# Patient Record
Sex: Male | Born: 1955 | Race: Black or African American | Hispanic: No
Health system: Midwestern US, Academic
[De-identification: ages and names within clinical notes are randomized; demographics above are authoritative.]

---

## 2017-02-09 ENCOUNTER — Encounter: Admit: 2017-02-09 | Discharge: 2017-02-09

## 2017-02-09 ENCOUNTER — Ambulatory Visit: Admit: 2017-02-09 | Discharge: 2017-02-09

## 2017-02-09 ENCOUNTER — Ambulatory Visit: Admit: 2017-02-09 | Discharge: 2017-02-09 | Payer: MEDICAID

## 2017-02-09 DIAGNOSIS — G4733 Obstructive sleep apnea (adult) (pediatric): ICD-10-CM

## 2017-02-09 DIAGNOSIS — I25119 Atherosclerotic heart disease of native coronary artery with unspecified angina pectoris: ICD-10-CM

## 2017-02-09 DIAGNOSIS — E782 Mixed hyperlipidemia: ICD-10-CM

## 2017-02-09 DIAGNOSIS — R079 Chest pain, unspecified: Principal | ICD-10-CM

## 2017-02-09 DIAGNOSIS — I429 Cardiomyopathy, unspecified: ICD-10-CM

## 2017-02-09 DIAGNOSIS — I10 Essential (primary) hypertension: ICD-10-CM

## 2017-02-09 DIAGNOSIS — I200 Unstable angina: ICD-10-CM

## 2017-02-09 DIAGNOSIS — I739 Peripheral vascular disease, unspecified: ICD-10-CM

## 2017-02-09 DIAGNOSIS — E119 Type 2 diabetes mellitus without complications: ICD-10-CM

## 2017-02-09 DIAGNOSIS — E785 Hyperlipidemia, unspecified: ICD-10-CM

## 2017-02-09 DIAGNOSIS — R0600 Dyspnea, unspecified: ICD-10-CM

## 2017-02-09 DIAGNOSIS — J449 Chronic obstructive pulmonary disease, unspecified: ICD-10-CM

## 2017-02-09 DIAGNOSIS — Z720 Tobacco use: ICD-10-CM

## 2017-02-09 DIAGNOSIS — I2510 Atherosclerotic heart disease of native coronary artery without angina pectoris: Secondary | ICD-10-CM

## 2017-02-09 LAB — COMPREHENSIVE METABOLIC PANEL
Lab: 0.3 mg/dL (ref 0.3–1.2)
Lab: 110 U/L (ref 25–110)
Lab: 14 U/L (ref 7–40)
Lab: 141 MMOL/L (ref 137–147)
Lab: 16 U/L (ref 7–56)
Lab: 25 MMOL/L (ref 21–30)
Lab: 3.8 MMOL/L (ref 3.5–5.1)
Lab: 4.4 g/dL — ABNORMAL LOW (ref 3.5–5.0)
Lab: 56 mL/min — ABNORMAL LOW (ref >60)
Lab: 8 10*3/uL (ref 3–12)
Lab: >60 mL/min (ref >60)

## 2017-02-09 LAB — LIPID PROFILE
Lab: 124 mg/dL (ref 6.0–8.0)
Lab: 153 mg/dL (ref <200)
Lab: 231 mg/dL — ABNORMAL HIGH (ref <150)
Lab: 46 mg/dL (ref 8.5–10.6)
Lab: 95 mg/dL — ABNORMAL HIGH (ref <100)

## 2017-02-09 LAB — BNP (B-TYPE NATRIURETIC PEPTI): Lab: 71 pg/mL (ref 0–100)

## 2017-02-09 LAB — CBC AND DIFF
Lab: 0.1 10*3/uL (ref 0–0.20)
Lab: 5.5 M/UL — ABNORMAL HIGH (ref 4.4–5.5)
Lab: 6.7 10*3/uL (ref 4.5–11.0)

## 2017-02-09 LAB — TROPONIN-I: Lab: 0 ng/mL — ABNORMAL LOW (ref >40)

## 2017-02-09 MED ORDER — NITROGLYCERIN 0.4 MG SL SUBL
ORAL_TABLET | SUBLINGUAL | 3 refills | 9.00000 days | Status: AC | PRN
Start: 2017-02-09 — End: 2019-03-07

## 2017-02-09 MED ORDER — DIPHENHYDRAMINE HCL 50 MG PO CAP
50 mg | Freq: Once | ORAL | 0 refills | Status: CN
Start: 2017-02-09 — End: ?

## 2017-02-09 MED ORDER — ASPIRIN 81 MG PO CHEW
324 mg | Freq: Once | ORAL | 0 refills | Status: CN
Start: 2017-02-09 — End: ?

## 2017-02-09 MED ORDER — CLOPIDOGREL 75 MG PO TAB
75 mg | Freq: Every day | ORAL | 0 refills | Status: CN
Start: 2017-02-09 — End: ?

## 2017-02-09 MED ORDER — SODIUM CHLORIDE 0.9 % IV SOLP
250 mL | INTRAVENOUS | 0 refills | Status: CN | PRN
Start: 2017-02-09 — End: ?

## 2017-02-09 MED ORDER — PREDNISONE 20 MG PO TAB
ORAL_TABLET | 0 refills | Status: SS
Start: 2017-02-09 — End: 2017-02-14

## 2017-02-09 MED ORDER — IMS MIXTURE TEMPLATE
60 mg | Freq: Once | ORAL | 0 refills | Status: CN
Start: 2017-02-09 — End: ?

## 2017-02-09 MED ORDER — FAMOTIDINE 20 MG PO TAB
20 mg | Freq: Once | ORAL | 0 refills | Status: CN
Start: 2017-02-09 — End: ?

## 2017-02-09 NOTE — Progress Notes
Date of Service: 02/09/2017    Sean Henderson is a 61 y.o. male.       HPI    I have seen and examined Sean Henderson along with the fellow, Dr. Lowell Guitar.  I have seen and examined the patient.  I reviewed the history, physical, and formed the impression and plan as outlined in the note.  He returns for cardiac followup.  He has been having increasing discomforts in his chest with shortness of breath.  He is concerned he has 'a new blockage.'  He has a history of cardiomyopathy with severe LV dysfunction.  He had LAD intervention in 2007 and then repeat stenting in 2009 with balloon intervention to the diagonal vessel.  His last heart catheterization in 2016 showed no significant progressive disease or restenosis.  In the last 2 years since he was seen, he has had prostate cancer.  He is being treated with hormone therapy.  He had radiation.  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  He is not having any claudication or resting symptoms at this time.  Over the last 3 or 4 months, he has had increasing chest tightness, pressure, and discomfort along with shortness of breath.  This improves with rest and aspirin.  He avoids nitroglycerin due to headaches.  He denies palpitations, presyncope, syncope, TIA, stroke, and claudication.     On exam, he is 5 feet 11.  Weight is 252.  Pulse is regular.  Rhythm is sinus.  Blood pressure 144/82.  Venous pressure is normal.  There is no edema.  Lungs are clear.  There is no wheeze or rhonchi.  There are no murmurs or rubs.     I am concerned he has had recurrent angina with increasing frequency and an unstable pattern.  I would like to check lab work, get an echocardiogram and get him in for coronary angiogram and possible intervention.  He will need contrast pretreatment given his history of allergy.     We will keep you informed with the results.  We will check some labs today.  We will check a troponin.  If his troponin is positive, we will proceed with admission later today and heparinization.    (ZOX:[id number])               Vitals:    02/09/17 1506   BP: 144/82   Pulse: 81   Weight: 114.3 kg (252 lb)   Height: 1.803 m (5' 11)     Body mass index is 35.15 kg/m???.     Past Medical History  Patient Active Problem List    Diagnosis Date Noted   ??? Unstable angina (HCC) 02/09/2017   ??? Cardiomyopathy (HCC) 01/07/2015   ??? PVD (peripheral vascular disease) (HCC) 11/25/2014   ??? Right flank pain 10/15/2013   ??? SOB (shortness of breath) 04/24/2011   ??? Chest pain 04/24/2011   ??? History of repair of left rotator cuff 06/20/2010     2010     ??? Tobacco dependence 03/02/2010     Peak tobacco consumption 4 ppd     ??? COPD (chronic obstructive pulmonary disease) (HCC) 04/01/2009     A. 2008 PFT FEV1/FEC 68%, FEV 56%, TLC 86%  B. Mod to severe not on  inhalers followed by [facility] Dr Lowella Petties     ??? Obstructive sleep apnea  10/28/2008     A. 07/22/08- Sleep study: [place] Mild obstructive breathing overall,moderate degree supine and during REM sleep non-supine.Mild  Desats & mild sleep disturbance.   B. 10/06/08- CPAP titration: to 14cm      ??? HTN (hypertension) 12/09/2007   ??? HLD (hyperlipidemia) 12/09/2007     A. 11/04 total 189 trig 167 HDL 29 LDL 144   B. 1/05 total 98 trig 397 HDL 30 LDL 54 Lipitor 40>> 10/06- start Vytorin 10-20  C. 6/07- total 104, trig 91, HDL 27, LDL 67- Vytorin 10-20  E. 16/1/09- total 160, trig 89, HDL 21, LDL 118 Myalgias:DC vytorin, Rx Crestor 10, niaspan 1000  G. 03/24/09- total 102, trig 154, HDL 25, LDL 60- simvastatin 40, niaspan 1gm  I. 09/12/10- total 106, trig 79,HDL 29, LDL 64- simvastatin 20, niaspan 1gm     ??? CAD (coronary artery disease), native coronary artery 12/09/2007     A.2/04 Normal coronaries TMC EF 15%   B. 2/07- Chest pain, admit [place]: Bare metal stent to 90% mLAD, EF 45%  C. 12/09/07 Angina, Cath [place]: BareMetalStent for 80% mLAD, Kissing balloon 80% Dx2   D. 06/17/08- dobutamine stress echo:  LV 5.2. EF 60%. No ischemia. E. 08/25/09- Dobutamine stress echo: Mild concentric LVH. EF 60%. LA is mildly enlarged. No ischemia.  F.  12/01/10 - Dobut   echo:  Terminated w/ HTN, HR<85%   EF - 60%  LVEDD 6.3 cm.  G  5/13 Reg Thall EF 52%, no ischemia    12/08/14: Cath by Dr. Micheline Rough - patent prior stent, no obstructive CAD     ??? 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 Normal coronaries.   B. 10/04- Echo: LVIDD 8.1 , EF 25% Valves OK  C. 4/05- Echo: EF 45%. LV 5.5 cm>>> BNP 24. tolerates hydralazine, headaches w/ imdur  D. 2/06, Echo: LV 5.6, EF 50%   E. 2/07- Chest pain, admit [place]: Bare metal stent to 90% mLAD, EF 45%  F. 02/19/07- Dobut echo: LV 6.4, EF 40%, No ischemia, Hypertensive BP response.   G. 12/09/07 Angina, Cath [place]: BareMetalStent for 80% mLAD, Kissing balloon 80% Dx2   H. 2/10 Echo EF 60%  I.  8/11 Dobutamine echo EF 60% no ischemia post stress by EKG or Echo.   J 5/13 Reg Thall EF 52%, no ischemia     ??? Encounter for long-term (current) use of other medications 09/11/2006         ROS    Physical Exam      Cardiovascular Studies      Problems Addressed Today  Encounter Diagnoses   Name Primary?   ??? Unstable angina (HCC) Yes   ??? Essential hypertension    ??? Mixed hyperlipidemia    ??? Coronary artery disease involving native coronary artery of native heart with angina pectoris (HCC)    ??? PVD (peripheral vascular disease) (HCC)    ??? Cardiomyopathy, unspecified type (HCC)    ??? Chest pain, unspecified type    ??? Obstructive sleep apnea         Assessment and Plan              Current Medications (including today's revisions)  ??? albuterol (VENTOLIN HFA, PROAIR HFA) 90 mcg/actuation inhaler Inhale 1-2 Puffs by mouth every 4 hours as needed for Wheezing (or shortness of breath).   ??? amLODIPine (NORVASC) 10 mg tablet TAKE 1 TABLET BY MOUTH EVERY DAY   ??? aspirin EC 81 mg tablet Take 1 Tab  by mouth daily. ??? atorvastatin (LIPITOR) 20 mg tablet TAKE 1 TABLET DAILY   ??? buPROPion SR(+) (WELLBUTRIN SR) 150 mg tablet Take 1 Tab by mouth twice daily. Take one tab daily for 3 days then increase to one tab twice a day thereafter   ??? carvedilol (COREG) 25 mg tablet TAKE 1 TABLET BY MOUTH TWICE A DAY WITH FOOD   ??? clopiDOGrel (PLAVIX) 75 mg tablet Take 75 mg by mouth daily. Indications: For peripheral stents - managed and to be filled by [facility] physician   ??? gabapentin (NEURONTIN) 100 mg PO capsule Take 1 Cap by mouth three times daily.   ??? hydrALAzine (APRESOLINE) 50 mg tablet TAKE 1 TABLET BY MOUTH 3 TIMES A DAY   ??? lisinopril (PRINIVIL, ZESTRIL) 40 mg tablet 1 Tab daily.   ??? metFORMIN-ER(+) (FORTAMET) 1,000 mg extended release tablet Take 1 Tab by mouth daily. DO NOT RESUME UNTIL 12/11/14   ??? nicotine (NICODERM CQ STEP 1) 21 mg/day patch Apply 1 Patch to top of skin as directed daily.     ??? nitroglycerin (NITROSTAT) 0.4 mg tablet Place one tab under tongue every 5 mins not to exceed 3 tabs;as needed for chest pain. If chest pain persists go to the ER or call 911.   ??? potassium chloride SR (K-DUR) 20 mEq tablet Take 1 Tab by mouth daily. Take with a meal and a full glass of water.   ??? prednisone (DELTASONE) 20 mg tablet Take 3 tabs night before procedure and 3 tabs am of procedure.

## 2017-02-09 NOTE — Progress Notes
Date of Service: 02/09/2017    Sean Henderson is a 61 y.o. male.       HPI     Sean Henderson is a 61 yo male with hx of CAD s/p PCI to LAD with BMSx2 (2007 and 2009), dilated cardiomyopathy, HFrEF (improved to 55% in 2014), PAD s/p PCI of the left leg, who presents to the clinic for follow up of his dilated cardiomyopathy.    Sean Henderson was last seen in the clinic in Sep 2016. 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 Most recent stent was placed in May 2018 at [facility] center. He is currently taking aspirin and plavix. He was also treated for prostate cancer. Today he says that he has been noticing shortness of breath and chest pain mainly with exertion. His chest pain is located in the left side of the chest and is non radiating. Worse with exertion and improves somewhat with rest. He describes is as both sharp and dull. Currently he denies any chest pain at rest. He denied any stenting to coronaries since 2009. He also reports exertional dyspnea but denied any associated orthopnea or PND. He denied any leg swellings, palpitations, presyncope or syncope. He continues to smoke although he reports he is trying to quit.         Vitals:    02/09/17 1506   BP: 144/82   Pulse: 81   Weight: 114.3 kg (252 lb)   Height: 1.803 m (5' 11)     Body mass index is 35.15 kg/m???.     Past Medical History  Patient Active Problem List    Diagnosis Date Noted   ??? Cardiomyopathy (HCC) 01/07/2015   ??? PVD (peripheral vascular disease) (HCC) 11/25/2014   ??? Right flank pain 10/15/2013   ??? SOB (shortness of breath) 04/24/2011   ??? Chest pain 04/24/2011   ??? History of repair of left rotator cuff 06/20/2010     2010     ??? Tobacco dependence 03/02/2010     Peak tobacco consumption 4 ppd     ??? COPD (chronic obstructive pulmonary disease) (HCC) 04/01/2009     A. 2008 PFT FEV1/FEC 68%, FEV 56%, TLC 86%  B. Mod to severe not on  inhalers followed by [facility] Dr Lowella Petties ??? Obstructive sleep apnea  10/28/2008     A. 07/22/08- Sleep study: [place] Mild obstructive breathing overall,moderate degree supine and during REM sleep non-supine.Mild Desats & mild sleep disturbance.   B. 10/06/08- CPAP titration: to 14cm      ??? HTN (hypertension) 12/09/2007   ??? HLD (hyperlipidemia) 12/09/2007     A. 11/04 total 189 trig 167 HDL 29 LDL 144   B. 1/05 total 98 trig 397 HDL 30 LDL 54 Lipitor 40>> 10/06- start Vytorin 10-20  C. 6/07- total 104, trig 91, HDL 27, LDL 67- Vytorin 10-20  E. 16/1/09- total 160, trig 89, HDL 21, LDL 118 Myalgias:DC vytorin, Rx Crestor 10, niaspan 1000  G. 03/24/09- total 102, trig 154, HDL 25, LDL 60- simvastatin 40, niaspan 1gm  I. 09/12/10- total 106, trig 79,HDL 29, LDL 64- simvastatin 20, niaspan 1gm     ??? CAD (coronary artery disease), native coronary artery 12/09/2007     A.2/04 Normal coronaries TMC EF 15%   B. 2/07- Chest pain, admit [place]: Bare metal stent to 90% mLAD, EF 45%  C. 12/09/07 Angina, Cath [place]: BareMetalStent for 80% mLAD,  Kissing balloon 80% Dx2   D. 06/17/08- dobutamine stress echo:  LV 5.2. EF 60%. No ischemia.   E. 08/25/09- Dobutamine stress echo: Mild concentric LVH. EF 60%. LA is mildly enlarged. No ischemia.  F.  12/01/10 - Dobut   echo:  Terminated w/ HTN, HR<85%   EF - 60%  LVEDD 6.3 cm.  G  5/13 Reg Thall EF 52%, no ischemia    12/08/14: Cath by Dr. Micheline Rough - patent prior stent, no obstructive CAD     ??? 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 Normal coronaries.   B. 10/04- Echo: LVIDD 8.1 , EF 25% Valves OK  C. 4/05- Echo: EF 45%. LV 5.5 cm>>> BNP 24. tolerates hydralazine, headaches w/ imdur  D. 2/06, Echo: LV 5.6, EF 50%   E. 2/07- Chest pain, admit [place]: Bare metal stent to 90% mLAD, EF 45%  F. 02/19/07- Dobut echo: LV 6.4, EF 40%, No ischemia, Hypertensive BP response. G. 12/09/07 Angina, Cath : BareMetalStent for 80% mLAD, Kissing balloon 80% Dx2   H. 2/10 Echo EF 60%  I.  8/11 Dobutamine echo EF 60% no ischemia post stress by EKG or Echo.   J 5/13 Reg Thall EF 52%, no ischemia     ??? Encounter for long-term (current) use of other medications 09/11/2006         Review of Systems   Constitution: Positive for diaphoresis, weakness and malaise/fatigue.   HENT: Negative for congestion and sore throat.    Eyes: Negative for pain.   Cardiovascular: Positive for chest pain, claudication and dyspnea on exertion.   Respiratory: Positive for shortness of breath, snoring and wheezing.    Hematologic/Lymphatic: Negative.    Skin: Negative for color change and unusual hair distribution.   Musculoskeletal: Positive for back pain. Negative for arthritis.   Gastrointestinal: Positive for anorexia. Negative for diarrhea.   Genitourinary: Positive for incomplete emptying and nocturia.   Neurological: Positive for excessive daytime sleepiness.   Psychiatric/Behavioral: Positive for depression. The patient is not nervous/anxious.    Allergic/Immunologic: Negative.        Physical Exam   Constitutional: He appears well-developed and well-nourished. No distress.   HENT:   Head: Normocephalic and atraumatic.   Eyes: EOM are normal. Left eye exhibits no discharge. No scleral icterus.   Neck: Neck supple. No JVD present. No tracheal deviation present. No thyromegaly present.   Cardiovascular: Normal rate, regular rhythm, normal heart sounds and intact distal pulses.  Exam reveals no gallop and no friction rub.    Pulmonary/Chest: Effort normal and breath sounds normal. No respiratory distress. He has no wheezes. He has no rales. He exhibits no tenderness.   Abdominal: Soft. Bowel sounds are normal. He exhibits distension. There is no tenderness. There is no rebound and no guarding.   Musculoskeletal: Normal range of motion. He exhibits no edema.   Lymphadenopathy:     He has no cervical adenopathy. Neurological: He is alert and oriented to person, place, and time.   Skin: Skin is warm and dry. He is not diaphoretic.         Cardiovascular Studies    ECHO - 2014  Moderate concentric hypertrophy of left ventricle with low normal ejection fraction 50-55 %.  Grade I (mild ) left ventricular diastolic dysfunction. Elevated left atrial pressure.   Left atrial cavity is mildly dilated.  No valvular abnormality.  No pericardial effusion.   ???  Comparison was done with the previous study performed on 04/24/11. Ejection fraction appears reduced in the current study.     Cardiac Cath 2016  No obstructive coronary artery disease as noted above with minimal in-stent restenosis of 2 prior known stents in the LAD system.  Normal systemic blood pressures.   Normal left ventricular end-diastolic pressure.  No stenosis across the aortic valve.    EKG Today  Sinus rhythm, LAD, early RW progression,LVH, No ST changes    Problems Addressed Today  No diagnosis found.    Assessment and Plan      # Chest pain - DDx unstable angina vs stable angina vs non cardiac chest pain.  # Coronary artery disease s/p PCI to LAD with BMS, most recent in 2009  # Dilated cardiomyopathy  # Peripheral arterial disease s/p stenting. Most recent in June 2018  # HTN  # Hx of prostate cancer    Plan  - Will obtain labs including cbc, cmp, bnp and trop  - Will give a prescription for PRN SL nitroglycerine  - Avoid strenuous activity  - Since the patient doesn't complain of rest pain, will plan for cardiac cath early next week.  - If the troponin comes back positive today, will admit for heparin gtt today  - Will also obtain an ECHO w doppler.   - Continue aspirin and plavix. Continue atorvastatin  - Continue home coreg, lisinopril and rest of the home HTN medications.    Pt seen and discussed with Dr. Vivianne Spence.    Judeen Hammans, MBBS  CV Fellow  Pager: [phone]           Current Medications (including today's revisions) ??? albuterol (VENTOLIN HFA, PROAIR HFA) 90 mcg/actuation inhaler Inhale 1-2 Puffs by mouth every 4 hours as needed for Wheezing (or shortness of breath).   ??? amLODIPine (NORVASC) 10 mg tablet TAKE 1 TABLET BY MOUTH EVERY DAY   ??? aspirin EC 81 mg tablet Take 1 Tab by mouth daily.   ??? atorvastatin (LIPITOR) 20 mg tablet TAKE 1 TABLET DAILY   ??? buPROPion SR(+) (WELLBUTRIN SR) 150 mg tablet Take 1 Tab by mouth twice daily. Take one tab daily for 3 days then increase to one tab twice a day thereafter   ??? carvedilol (COREG) 25 mg tablet TAKE 1 TABLET BY MOUTH TWICE A DAY WITH FOOD   ??? clopiDOGrel (PLAVIX) 75 mg tablet Take 75 mg by mouth daily. Indications: For peripheral stents - managed and to be filled by [facility] physician   ??? gabapentin (NEURONTIN) 100 mg PO capsule Take 1 Cap by mouth three times daily.   ??? hydrALAzine (APRESOLINE) 50 mg tablet TAKE 1 TABLET BY MOUTH 3 TIMES A DAY   ??? lisinopril (PRINIVIL, ZESTRIL) 40 mg tablet 1 Tab daily.   ??? metFORMIN-ER(+) (FORTAMET) 1,000 mg extended release tablet Take 1 Tab by mouth daily. DO NOT RESUME UNTIL 12/11/14   ??? nicotine (NICODERM CQ STEP 1) 21 mg/day patch Apply 1 Patch to top of skin as directed daily.     ??? potassium chloride SR (K-DUR) 20 mEq tablet Take 1 Tab by mouth daily. Take with a meal and a full glass of water.

## 2017-02-12 ENCOUNTER — Encounter: Admit: 2017-02-12 | Discharge: 2017-02-12

## 2017-02-12 DIAGNOSIS — I200 Unstable angina: Principal | ICD-10-CM

## 2017-02-12 DIAGNOSIS — R079 Chest pain, unspecified: ICD-10-CM

## 2017-02-12 DIAGNOSIS — I10 Essential (primary) hypertension: Secondary | ICD-10-CM

## 2017-02-12 DIAGNOSIS — I25110 Atherosclerotic heart disease of native coronary artery with unstable angina pectoris: Principal | ICD-10-CM

## 2017-02-12 MED ORDER — MAGNESIUM HYDROXIDE 2,400 MG/10 ML PO SUSP
10 mL | ORAL | 0 refills | Status: CN | PRN
Start: 2017-02-12 — End: ?

## 2017-02-12 MED ORDER — ACETAMINOPHEN 325 MG PO TAB
650 mg | ORAL | 0 refills | Status: CN | PRN
Start: 2017-02-12 — End: ?

## 2017-02-12 MED ORDER — ALUMINUM-MAGNESIUM HYDROXIDE 200-200 MG/5 ML PO SUSP
30 mL | ORAL | 0 refills | Status: CN | PRN
Start: 2017-02-12 — End: ?

## 2017-02-12 MED ORDER — TEMAZEPAM 15 MG PO CAP
15 mg | Freq: Every evening | ORAL | 0 refills | Status: CN | PRN
Start: 2017-02-12 — End: ?

## 2017-02-12 NOTE — H&P (View-Only)
Patient presents for procedure. Please see most recent H/P from 02/09/17 below.     Sean Parkinson, APRN-C  Pager [phone]     _____________________________________________________________________________    Office Visit     02/09/2017  Cardiovascular Medicine   [name], MD   Cardiology   Unstable angina [facility]) +7 more   Dx   Cardiac Eval ; Referred by Self, Referral   Reason for Visit    Progress Notes   [name], MD (Physician) ??? ??? Cardiology ??? ??? 02/09/17 1445 ??? ??? Signed      Date of Service: 02/09/2017  ???  Sean Henderson is a 61 y.o. male.     ???  HPI    I have seen and examined Mr. Dockstader along with the fellow, Dr. Lowell Guitar.  I have seen and examined the patient.  I reviewed the history, physical, and formed the impression and plan as outlined in the note.  He returns for cardiac followup.  He has been having increasing discomforts in his chest with shortness of breath.  He is concerned he has 'a new blockage.'  He has a history of cardiomyopathy with severe LV dysfunction.  He had LAD intervention in 2007 and then repeat stenting in 2009 with balloon intervention to the diagonal vessel.  His last heart catheterization in 2016 showed no significant progressive disease or restenosis.  In the last 2 years since he was seen, he has had prostate cancer.  He is being treated with hormone therapy.  He had radiation.  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  He is not having any claudication or resting symptoms at this time.  Over the last 3 or 4 months, he has had increasing chest tightness, pressure, and discomfort along with shortness of breath.  This improves with rest and aspirin.  He avoids nitroglycerin due to headaches.  He denies palpitations, presyncope, syncope, TIA, stroke, and claudication.   ???  On exam, he is 5 feet 11.  Weight is 252.  Pulse is regular.  Rhythm is sinus.  Blood pressure 144/82.  Venous pressure is normal.  There is no edema.  Lungs are clear.  There is no wheeze or rhonchi.  There are no murmurs or rubs.   ???  I am concerned he has had recurrent angina with increasing frequency and an unstable pattern.  I would like to check lab work, get an echocardiogram and get him in for coronary angiogram and possible intervention.  He will need contrast pretreatment given his history of allergy.   ???  We will keep you informed with the results.  We will check some labs today.  We will check a troponin.  If his troponin is positive, we will proceed with admission later today and heparinization.  ???  (RUE:[id number])  ???  ???  ???  ???  ???      Vitals:   ??? 02/09/17 1506   BP: 144/82   Pulse: 81   Weight: 114.3 kg (252 lb)   Height: 1.803 m (5' 11)   ???  Body mass index is 35.15 kg/m???.   ???  Past Medical History        Patient Active Problem List   ??? Diagnosis Date Noted   ??? Unstable angina (HCC) 02/09/2017   ??? Cardiomyopathy (HCC) 01/07/2015   ??? PVD (peripheral vascular disease) (HCC) 11/25/2014   ??? Right flank pain 10/15/2013   ??? SOB (shortness of breath) 04/24/2011   ???  Chest pain 04/24/2011   ??? History of repair of left rotator cuff 06/20/2010   ??? ??? 2010  ???   ??? Tobacco dependence 03/02/2010   ??? ??? Peak tobacco consumption 4 ppd  ???   ??? COPD (chronic obstructive pulmonary disease) (HCC) 04/01/2009   ??? ??? A. 2008 PFT FEV1/FEC 68%, FEV 56%, TLC 86%  B. Mod to severe not on  inhalers followed by [facility] Dr Lowella Petties  ???   ??? Obstructive sleep apnea  10/28/2008   ??? ??? A. 07/22/08- Sleep study: [place] Mild obstructive breathing overall,moderate degree supine and during REM sleep non-supine.Mild Desats & mild sleep disturbance.   B. 10/06/08- CPAP titration: to 14cm   ???   ??? HTN (hypertension) 12/09/2007   ??? HLD (hyperlipidemia) 12/09/2007   ??? ??? A. 11/04 total 189 trig 167 HDL 29 LDL 144   B. 1/05 total 98 trig 397 HDL 30 LDL 54 Lipitor 40>> 10/06- start Vytorin 10-20  C. 6/07- total 104, trig 91, HDL 27, LDL 67- Vytorin 10-20 E. 01/29/07- total 160, trig 89, HDL 21, LDL 118 Myalgias:DC vytorin, Rx Crestor 10, niaspan 1000  G. 03/24/09- total 102, trig 154, HDL 25, LDL 60- simvastatin 40, niaspan 1gm  I. 09/12/10- total 106, trig 79,HDL 29, LDL 64- simvastatin 20, niaspan 1gm  ???   ??? CAD (coronary artery disease), native coronary artery 12/09/2007   ??? ??? A.2/04 Normal coronaries TMC EF 15%   B. 2/07- Chest pain, admit [place]: Bare metal stent to 90% mLAD, EF 45%  C. 12/09/07 Angina, Cath [place]: BareMetalStent for 80% mLAD, Kissing balloon 80% Dx2   D. 06/17/08- dobutamine stress echo:  LV 5.2. EF 60%. No ischemia.   E. 08/25/09- Dobutamine stress echo: Mild concentric LVH. EF 60%. LA is mildly enlarged. No ischemia.  F.  12/01/10 - Dobut   echo:  Terminated w/ HTN, HR<85%   EF - 60%  LVEDD 6.3 cm.  G  5/13 Reg Thall EF 52%, no ischemia  ???  12/08/14: Cath by Dr. Micheline Rough - patent prior stent, no obstructive CAD  ???   ??? 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 ??? Dilated cardiomyopathy and late onset CAD  A, 06/10/02: Cardiac cath: [facility]: EF 15%. Normal coronaries.   B. 10/04- Echo: LVIDD 8.1 , EF 25% Valves OK  C. 4/05- Echo: EF 45%. LV 5.5 cm>>> BNP 24. tolerates hydralazine, headaches w/ imdur  D. 2/06, Echo: LV 5.6, EF 50%   E. 2/07- Chest pain, admit [place]: Bare metal stent to 90% mLAD, EF 45%  F. 02/19/07- Dobut echo: LV 6.4, EF 40%, No ischemia, Hypertensive BP response.   G. 12/09/07 Angina, Cath [place]: BareMetalStent for 80% mLAD, Kissing balloon 80% Dx2   H. 2/10 Echo EF 60%  I.  8/11 Dobutamine echo EF 60% no ischemia post stress by EKG or Echo.   J 5/13 Reg Thall EF 52%, no ischemia  ???   ??? Encounter for long-term (current) use of other medications 09/11/2006   ???  ???  ???  ROS  ???  Physical Exam  ???  ???  Cardiovascular Studies  ???  ???  Problems Addressed Today       Encounter Diagnoses   Name Primary?   ??? Unstable angina (HCC) Yes   ??? Essential hypertension ???   ??? Mixed hyperlipidemia ??? ??? Coronary artery disease involving native coronary artery of native heart with angina pectoris (HCC) ???   ???  PVD (peripheral vascular disease) (HCC) ???   ??? Cardiomyopathy, unspecified type (HCC) ???   ??? Chest pain, unspecified type ???   ??? Obstructive sleep apnea  ???   ???  ???  Assessment and Plan  ???  ???  ???  Current Medications (including today's revisions)  ??? albuterol (VENTOLIN HFA, PROAIR HFA) 90 mcg/actuation inhaler Inhale 1-2 Puffs by mouth every 4 hours as needed for Wheezing (or shortness of breath).   ??? amLODIPine (NORVASC) 10 mg tablet TAKE 1 TABLET BY MOUTH EVERY DAY   ??? aspirin EC 81 mg tablet Take 1 Tab by mouth daily.   ??? atorvastatin (LIPITOR) 20 mg tablet TAKE 1 TABLET DAILY   ??? buPROPion SR(+) (WELLBUTRIN SR) 150 mg tablet Take 1 Tab by mouth twice daily. Take one tab daily for 3 days then increase to one tab twice a day thereafter   ??? carvedilol (COREG) 25 mg tablet TAKE 1 TABLET BY MOUTH TWICE A DAY WITH FOOD   ??? clopiDOGrel (PLAVIX) 75 mg tablet Take 75 mg by mouth daily. Indications: For peripheral stents - managed and to be filled by [facility] physician   ??? gabapentin (NEURONTIN) 100 mg PO capsule Take 1 Cap by mouth three times daily.   ??? hydrALAzine (APRESOLINE) 50 mg tablet TAKE 1 TABLET BY MOUTH 3 TIMES A DAY   ??? lisinopril (PRINIVIL, ZESTRIL) 40 mg tablet 1 Tab daily.   ??? metFORMIN-ER(+) (FORTAMET) 1,000 mg extended release tablet Take 1 Tab by mouth daily. DO NOT RESUME UNTIL 12/11/14   ??? nicotine (NICODERM CQ STEP 1) 21 mg/day patch Apply 1 Patch to top of skin as directed daily.     ??? nitroglycerin (NITROSTAT) 0.4 mg tablet Place one tab under tongue every 5 mins not to exceed 3 tabs;as needed for chest pain. If chest pain persists go to the ER or call 911.   ??? potassium chloride SR (K-DUR) 20 mEq tablet Take 1 Tab by mouth daily. Take with a meal and a full glass of water.   ??? prednisone (DELTASONE) 20 mg tablet Take 3 tabs night before procedure and 3 tabs am of procedure.   ???  ???        Note Details Progress Notes   Taduru, Kirt Boys, MBBS (Fellow) ??? ??? Cardiology ??? ??? 02/09/17 1445 ??? ??? Signed      Date of Service: 02/09/2017  ???  Sean Henderson is a 61 y.o. male.     ???  HPI  Mr Kozlov is a 61 yo male with hx of CAD s/p PCI to LAD with BMSx2 (2007 and 2009), dilated cardiomyopathy, HFrEF (improved to 55% in 2014), PAD s/p PCI of the left leg, who presents to the clinic for follow up of his dilated cardiomyopathy.  ???  Mr Paras was last seen in the clinic in Sep 2016. 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 Most recent stent was placed in May 2018 at [facility] center. He is currently taking aspirin and plavix. He was also treated for prostate cancer. Today he says that he has been noticing shortness of breath and chest pain mainly with exertion. His chest pain is located in the left side of the chest and is non radiating. Worse with exertion and improves somewhat with rest. He describes is as both sharp and dull. Currently he denies any chest pain at rest. He denied any stenting to coronaries  since 2009. He also reports exertional dyspnea but denied any associated orthopnea or PND. He denied any leg swellings, palpitations, presyncope or syncope. He continues to smoke although he reports he is trying to quit.  ???  ???      Vitals:   ??? 02/09/17 1506   BP: 144/82   Pulse: 81   Weight: 114.3 kg (252 lb)   Height: 1.803 m (5' 11)   ???  Body mass index is 35.15 kg/m???.   ???  Past Medical History        Patient Active Problem List   ??? Diagnosis Date Noted   ??? Cardiomyopathy (HCC) 01/07/2015   ??? PVD (peripheral vascular disease) (HCC) 11/25/2014   ??? Right flank pain 10/15/2013   ??? SOB (shortness of breath) 04/24/2011   ??? Chest pain 04/24/2011   ??? History of repair of left rotator cuff 06/20/2010   ??? ??? 2010  ???   ??? Tobacco dependence 03/02/2010   ??? ??? Peak tobacco consumption 4 ppd  ??? ??? COPD (chronic obstructive pulmonary disease) (HCC) 04/01/2009   ??? ??? A. 2008 PFT FEV1/FEC 68%, FEV 56%, TLC 86%  B. Mod to severe not on  inhalers followed by [facility] Dr Lowella Petties  ???   ??? Obstructive sleep apnea  10/28/2008   ??? ??? A. 07/22/08- Sleep study: [place] Mild obstructive breathing overall,moderate degree supine and during REM sleep non-supine.Mild Desats & mild sleep disturbance.   B. 10/06/08- CPAP titration: to 14cm   ???   ??? HTN (hypertension) 12/09/2007   ??? HLD (hyperlipidemia) 12/09/2007   ??? ??? A. 11/04 total 189 trig 167 HDL 29 LDL 144   B. 1/05 total 98 trig 397 HDL 30 LDL 54 Lipitor 40>> 10/06- start Vytorin 10-20  C. 6/07- total 104, trig 91, HDL 27, LDL 67- Vytorin 10-20  E. 16/1/09- total 160, trig 89, HDL 21, LDL 118 Myalgias:DC vytorin, Rx Crestor 10, niaspan 1000  G. 03/24/09- total 102, trig 154, HDL 25, LDL 60- simvastatin 40, niaspan 1gm  I. 09/12/10- total 106, trig 79,HDL 29, LDL 64- simvastatin 20, niaspan 1gm  ???   ??? CAD (coronary artery disease), native coronary artery 12/09/2007   ??? ??? A.2/04 Normal coronaries TMC EF 15%   B. 2/07- Chest pain, admit [place]: Bare metal stent to 90% mLAD, EF 45%  C. 12/09/07 Angina, Cath [place]: BareMetalStent for 80% mLAD, Kissing balloon 80% Dx2   D. 06/17/08- dobutamine stress echo:  LV 5.2. EF 60%. No ischemia.   E. 08/25/09- Dobutamine stress echo: Mild concentric LVH. EF 60%. LA is mildly enlarged. No ischemia.  F.  12/01/10 - Dobut   echo:  Terminated w/ HTN, HR<85%   EF - 60%  LVEDD 6.3 cm.  G  5/13 Reg Thall EF 52%, no ischemia  ???  12/08/14: Cath by Dr. Micheline Rough - patent prior stent, no obstructive CAD  ???   ??? 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 ??? Dilated cardiomyopathy and late onset CAD  A, 06/10/02: Cardiac cath: [facility]: EF 15%. Normal coronaries.   B. 10/04- Echo: LVIDD 8.1 , EF 25% Valves OK  C. 4/05- Echo: EF 45%. LV 5.5 cm>>> BNP 24. tolerates hydralazine, headaches w/ imdur  D. 2/06, Echo: LV 5.6, EF 50% E. 2/07- Chest pain, admit [place]: Bare metal stent to 90% mLAD, EF 45%  F. 02/19/07- Dobut echo: LV 6.4, EF 40%, No ischemia, Hypertensive BP response.   G. 12/09/07  Angina, Cath [place]: BareMetalStent for 80% mLAD, Kissing balloon 80% Dx2   H. 2/10 Echo EF 60%  I.  8/11 Dobutamine echo EF 60% no ischemia post stress by EKG or Echo.   J 5/13 Reg Thall EF 52%, no ischemia  ???   ??? Encounter for long-term (current) use of other medications 09/11/2006   ???  ???  ???  Review of Systems   Constitution: Positive for diaphoresis, weakness and malaise/fatigue.   HENT: Negative for congestion and sore throat.    Eyes: Negative for pain.   Cardiovascular: Positive for chest pain, claudication and dyspnea on exertion.   Respiratory: Positive for shortness of breath, snoring and wheezing.    Hematologic/Lymphatic: Negative.    Skin: Negative for color change and unusual hair distribution.   Musculoskeletal: Positive for back pain. Negative for arthritis.   Gastrointestinal: Positive for anorexia. Negative for diarrhea.   Genitourinary: Positive for incomplete emptying and nocturia.   Neurological: Positive for excessive daytime sleepiness.   Psychiatric/Behavioral: Positive for depression. The patient is not nervous/anxious.    Allergic/Immunologic: Negative.    ???  ???  Physical Exam   Constitutional: He appears well-developed and well-nourished. No distress.   HENT:   Head: Normocephalic and atraumatic.   Eyes: EOM are normal. Left eye exhibits no discharge. No scleral icterus.   Neck: Neck supple. No JVD present. No tracheal deviation present. No thyromegaly present.   Cardiovascular: Normal rate, regular rhythm, normal heart sounds and intact distal pulses.  Exam reveals no gallop and no friction rub.    Pulmonary/Chest: Effort normal and breath sounds normal. No respiratory distress. He has no wheezes. He has no rales. He exhibits no tenderness.   Abdominal: Soft. Bowel sounds are normal. He exhibits distension. There is no tenderness. There is no rebound and no guarding.   Musculoskeletal: Normal range of motion. He exhibits no edema.   Lymphadenopathy:     He has no cervical adenopathy.   Neurological: He is alert and oriented to person, place, and time.   Skin: Skin is warm and dry. He is not diaphoretic.   ???  ???  ???  Cardiovascular Studies  ???  ECHO - 2014  Moderate concentric hypertrophy of left ventricle with low normal ejection fraction 50-55 %.  Grade I (mild ) left ventricular diastolic dysfunction. Elevated left atrial pressure.   Left atrial cavity is mildly dilated.   No valvular abnormality.  No pericardial effusion.   ???  Comparison was done with the previous study performed on 04/24/11. Ejection fraction appears reduced in the current study.   ???  Cardiac Cath 2016  No obstructive coronary artery disease as noted above with minimal in-stent restenosis of 2 prior known stents in the LAD system.  Normal systemic blood pressures.   Normal left ventricular end-diastolic pressure.  No stenosis across the aortic valve.  ???  EKG Today  Sinus rhythm, LAD, early RW progression,LVH, No ST changes  ???  Problems Addressed Today  No diagnosis found.  ???  Assessment and Plan      # Chest pain - DDx unstable angina vs stable angina vs non cardiac chest pain.  # Coronary artery disease s/p PCI to LAD with BMS, most recent in 2009  # Dilated cardiomyopathy  # Peripheral arterial disease s/p stenting. Most recent in June 2018  # HTN  # Hx of prostate cancer  ???  Plan  - Will obtain labs including cbc, cmp, bnp and trop  - Will  give a prescription for PRN SL nitroglycerine  - Avoid strenuous activity  - Since the patient doesn't complain of rest pain, will plan for cardiac cath early next week.  - If the troponin comes back positive today, will admit for heparin gtt today  - Will also obtain an ECHO w doppler.   - Continue aspirin and plavix. Continue atorvastatin  - Continue home coreg, lisinopril and rest of the home HTN medications.  ??? Pt seen and discussed with Dr. Vivianne Spence.  ???  Sean Henderson, MBBS  CV Fellow  Pager: [phone]  ???  ???  ???  Current Medications (including today's revisions)  ??? albuterol (VENTOLIN HFA, PROAIR HFA) 90 mcg/actuation inhaler Inhale 1-2 Puffs by mouth every 4 hours as needed for Wheezing (or shortness of breath).   ??? amLODIPine (NORVASC) 10 mg tablet TAKE 1 TABLET BY MOUTH EVERY DAY   ??? aspirin EC 81 mg tablet Take 1 Tab by mouth daily.   ??? atorvastatin (LIPITOR) 20 mg tablet TAKE 1 TABLET DAILY   ??? buPROPion SR(+) (WELLBUTRIN SR) 150 mg tablet Take 1 Tab by mouth twice daily. Take one tab daily for 3 days then increase to one tab twice a day thereafter   ??? carvedilol (COREG) 25 mg tablet TAKE 1 TABLET BY MOUTH TWICE A DAY WITH FOOD   ??? clopiDOGrel (PLAVIX) 75 mg tablet Take 75 mg by mouth daily. Indications: For peripheral stents - managed and to be filled by [facility] physician   ??? gabapentin (NEURONTIN) 100 mg PO capsule Take 1 Cap by mouth three times daily.   ??? hydrALAzine (APRESOLINE) 50 mg tablet TAKE 1 TABLET BY MOUTH 3 TIMES A DAY   ??? lisinopril (PRINIVIL, ZESTRIL) 40 mg tablet 1 Tab daily.   ??? metFORMIN-ER(+) (FORTAMET) 1,000 mg extended release tablet Take 1 Tab by mouth daily. DO NOT RESUME UNTIL 12/11/14   ??? nicotine (NICODERM CQ STEP 1) 21 mg/day patch Apply 1 Patch to top of skin as directed daily.     ??? potassium chloride SR (K-DUR) 20 mEq tablet Take 1 Tab by mouth daily. Take with a meal and a full glass of water.   ???

## 2017-02-12 NOTE — Progress Notes
[employer] with [employer], [phone], confirmed benefits and eligibility:  Patient is enrolled and the plan is active.  Pre-certification through [employer], [phone], is required for [place] [id number]. Reference #02/12/2017 11:30    Allie, clinical review nurse for [employer], after extensive clinical review gave approval for procedure, valid for a single date of service between 02/14/2017 - 03/16/2017.  Authorization [id number]

## 2017-02-13 ENCOUNTER — Ambulatory Visit: Admit: 2017-02-13 | Discharge: 2017-02-13

## 2017-02-14 ENCOUNTER — Ambulatory Visit: Admit: 2017-02-14 | Discharge: 2017-02-14

## 2017-02-14 ENCOUNTER — Encounter: Admit: 2017-02-14 | Discharge: 2017-02-14

## 2017-02-14 ENCOUNTER — Ambulatory Visit: Admit: 2017-02-14 | Discharge: 2017-02-14 | Payer: MEDICAID

## 2017-02-14 ENCOUNTER — Ambulatory Visit: Admit: 2017-02-14 | Discharge: 2017-02-15

## 2017-02-14 DIAGNOSIS — R0602 Shortness of breath: ICD-10-CM

## 2017-02-14 DIAGNOSIS — I25119 Atherosclerotic heart disease of native coronary artery with unspecified angina pectoris: ICD-10-CM

## 2017-02-14 DIAGNOSIS — J449 Chronic obstructive pulmonary disease, unspecified: ICD-10-CM

## 2017-02-14 DIAGNOSIS — G4733 Obstructive sleep apnea (adult) (pediatric): ICD-10-CM

## 2017-02-14 DIAGNOSIS — N183 Chronic kidney disease, stage 3 (moderate): ICD-10-CM

## 2017-02-14 DIAGNOSIS — I10 Essential (primary) hypertension: Principal | ICD-10-CM

## 2017-02-14 DIAGNOSIS — E782 Mixed hyperlipidemia: Principal | ICD-10-CM

## 2017-02-14 DIAGNOSIS — I2510 Atherosclerotic heart disease of native coronary artery without angina pectoris: ICD-10-CM

## 2017-02-14 DIAGNOSIS — F17200 Nicotine dependence, unspecified, uncomplicated: ICD-10-CM

## 2017-02-14 DIAGNOSIS — I25110 Atherosclerotic heart disease of native coronary artery with unstable angina pectoris: Principal | ICD-10-CM

## 2017-02-14 DIAGNOSIS — R0600 Dyspnea, unspecified: ICD-10-CM

## 2017-02-14 DIAGNOSIS — E119 Type 2 diabetes mellitus without complications: ICD-10-CM

## 2017-02-14 DIAGNOSIS — Z720 Tobacco use: ICD-10-CM

## 2017-02-14 DIAGNOSIS — E785 Hyperlipidemia, unspecified: ICD-10-CM

## 2017-02-14 LAB — BASIC METABOLIC PANEL
Lab: 135 MMOL/L — ABNORMAL LOW (ref 137–147)
Lab: 4.7 MMOL/L (ref 3.5–5.1)

## 2017-02-14 LAB — POC GLUCOSE: Lab: 187 mg/dL — ABNORMAL HIGH (ref 70–100)

## 2017-02-14 MED ORDER — PREDNISONE 20 MG PO TAB
60 mg | Freq: Once | ORAL | 0 refills | Status: DC
Start: 2017-02-14 — End: 2017-02-15

## 2017-02-14 MED ORDER — ALUMINUM-MAGNESIUM HYDROXIDE 200-200 MG/5 ML PO SUSP
30 mL | ORAL | 0 refills | Status: DC | PRN
Start: 2017-02-14 — End: 2017-02-15

## 2017-02-14 MED ORDER — ASPIRIN 81 MG PO CHEW
324 mg | Freq: Once | ORAL | 0 refills | Status: CP
Start: 2017-02-14 — End: ?
  Administered 2017-02-14: 15:00:00 324 mg via ORAL

## 2017-02-14 MED ORDER — FAMOTIDINE 20 MG PO TAB
20 mg | Freq: Once | ORAL | 0 refills | Status: CP
Start: 2017-02-14 — End: ?
  Administered 2017-02-14: 15:00:00 20 mg via ORAL

## 2017-02-14 MED ORDER — CLOPIDOGREL 75 MG PO TAB
75 mg | Freq: Every day | ORAL | 0 refills | Status: DC
Start: 2017-02-14 — End: 2017-02-15
  Administered 2017-02-14: 15:00:00 75 mg via ORAL

## 2017-02-14 MED ORDER — DIPHENHYDRAMINE HCL 50 MG/ML IJ SOLN
25 mg | INTRAVENOUS | 0 refills | Status: DC | PRN
Start: 2017-02-14 — End: 2017-02-15

## 2017-02-14 MED ORDER — TEMAZEPAM 15 MG PO CAP
15 mg | Freq: Every evening | ORAL | 0 refills | Status: DC | PRN
Start: 2017-02-14 — End: 2017-02-15

## 2017-02-14 MED ORDER — ATORVASTATIN 20 MG PO TAB
40 mg | ORAL_TABLET | Freq: Every day | ORAL | 3 refills | Status: AC
Start: 2017-02-14 — End: 2017-02-15

## 2017-02-14 MED ORDER — ACETAMINOPHEN 325 MG PO TAB
650 mg | ORAL | 0 refills | Status: DC | PRN
Start: 2017-02-14 — End: 2017-02-15

## 2017-02-14 MED ORDER — SODIUM CHLORIDE 0.9 % IV SOLP
250 mL | INTRAVENOUS | 0 refills | Status: DC | PRN
Start: 2017-02-14 — End: 2017-02-15
  Administered 2017-02-14: 15:00:00 250 mL via INTRAVENOUS

## 2017-02-14 MED ORDER — DIPHENHYDRAMINE HCL 50 MG PO CAP
50 mg | Freq: Once | ORAL | 0 refills | Status: CP
Start: 2017-02-14 — End: ?
  Administered 2017-02-14: 15:00:00 50 mg via ORAL

## 2017-02-14 MED ORDER — MAGNESIUM HYDROXIDE 2,400 MG/10 ML PO SUSP
10 mL | ORAL | 0 refills | Status: DC | PRN
Start: 2017-02-14 — End: 2017-02-15

## 2017-02-14 MED ORDER — PERFLUTREN LIPID MICROSPHERES 1.1 MG/ML IV SUSP
1-20 mL | Freq: Once | INTRAVENOUS | 0 refills | Status: CP
Start: 2017-02-14 — End: ?
  Administered 2017-02-14: 20:00:00 1.5 mL via INTRAVENOUS

## 2017-02-14 MED ORDER — DIPHENHYDRAMINE HCL 25 MG PO CAP
25 mg | ORAL | 0 refills | Status: DC | PRN
Start: 2017-02-14 — End: 2017-02-15

## 2017-02-14 MED ORDER — ONDANSETRON HCL (PF) 4 MG/2 ML IJ SOLN
4 mg | INTRAVENOUS | 0 refills | Status: DC | PRN
Start: 2017-02-14 — End: 2017-02-15

## 2017-02-14 NOTE — Discharge Instructions - Pharmacy
Physician Discharge Summary      Name: Sean Henderson  Medical Record Number: [id number]        Account Number:  [id number]  Date Of Birth:  12-Aug-1955                         Age:  61 years   Admit date:  02/14/2017                     Discharge date:  02/14/2017    Attending Physician:               Service: Med-Cardiovasc    Physician Summary completed by: Kathie Rhodes, PA-C    Reason for hospitalization:     Significant PMH:   Past Medical History:   Diagnosis Date   ??? CAD (coronary artery disease), native coronary artery 12/09/2007    A.2/04 Normal coronaries [facility] EF 15%  B. 2/07- Chest pain, admit [place]: Bare metal stent to 90% mLAD, EF 45% C. 12/09/07 Angina, Cath [place]: BareMetalStent for 80% mLAD, Kissing balloon 80% Dx2  D. 06/17/08- dobutamine stress echo:  LV 5.2. EF 60%. No ischemia.  E. 08/25/09- Dobutamine stress echo: Mild concentric LVH. EF 60%. LA is mildly enlarged. No ischemia. F.  12/01/10 - Dobut   echo:  Terminated w/ HT   ??? Cardiomyopathy    ??? CKD (chronic kidney disease) stage 3, GFR 30-59 ml/min (HCC) 02/14/2017   ??? COPD (chronic obstructive pulmonary disease) (HCC)    ??? Coronary artery disease    ??? DM (diabetes mellitus) (HCC)    ??? Dyspnea    ??? HLD (hyperlipidemia)    ??? Hypertension    ??? OSA (obstructive sleep apnea)    ??? Tobacco abuse        Allergies: Isovue-128 [iopamidol]; Contrast dye iv, iodine containing [iodinated contrast- oral and iv dye]; and Nitroglycerin          Admission Lab/Radiology studies notable for:  Hematology:    Lab Results   Component Value Date    HGB 15.8 02/09/2017    HCT 48.4 02/09/2017    PLTCT 258 02/09/2017    WBC 6.7 02/09/2017    NEUT 70 02/09/2017    ANC 4.70 02/09/2017    LYMPH 34 06/24/2004    ALC 1.00 02/09/2017    MONA 10 02/09/2017    AMC 0.70 02/09/2017    ABC 0.10 02/09/2017    BASOPHILS 1 06/24/2004    MCV 87.0 02/09/2017    MCHC 32.7 02/09/2017    MPV 8.1 02/09/2017    RDW 14.7 02/09/2017   , General Chemistry:    Lab Results   Component Value Date NA 135 02/14/2017    K 4.7 02/14/2017    CL 104 02/14/2017    GAP 7 02/14/2017    BUN 29 02/14/2017    CR 1.48 02/14/2017    GLU 230 02/14/2017    GLU 84 10/19/2005    CA 9.7 02/14/2017    KETONES NEG 03/07/2009    ALBUMIN 4.4 02/09/2017    LACTIC 1.2 01/09/2013    MG 2.0 04/27/2011    TOTBILI 0.3 02/09/2017    and Lipid Profile:   Lab Results   Component Value Date    CHOL 153 02/09/2017    TRIG 231 02/09/2017    HDL 29 02/09/2017    LDL 95 02/09/2017    VLDL 46 02/09/2017  Brief Hospital Course:      Mr. Sean Henderson is a 61 yr old M with PMHx of ICM, prior stents to LAD with normalization of LV function on GDMT, GERD, CKD stage III, hypertension and OSA. He was recently seen with complaints of increasing chest discomfort and dyspnea.  There was concerns of stent progression and angina. He presents today undergoing cardiac cath with findings of patent LAD stents x 2 and essential no significant changes from cardiac cath in 2016. He was monitored post procedure with stable radial site, vital signs and telemetry. His lipid panel from 02/09/17 shows LDL of 95, his atorvastatin was increased to 40 mg daily for goal LDL of < 70.  He will follow up with Dr Vivianne Spence in 6-8 weeks for continued cardiac management and GDMT.  Further outpatient evaluation if chest pain persists for non cardiac etiology ie: GI    Upon discharge the patient and family were given post procedure instructions/restrictions as well as written instructions ( see Discharge instructions)        Condition at Discharge: Stable      Hospital Problems        Active Problems    HTN (hypertension)    HLD (hyperlipidemia)    CAD (coronary artery disease), native coronary artery    Obstructive sleep apnea     CKD (chronic kidney disease) stage 3, GFR 30-59 ml/min (HCC)          Surgical Procedures: None    Significant Diagnostic Studies and Procedures:   02/14/17: Cardiac cath: patent LAD stents x 2, Lcx free of dz, OM1 with ostial 60% stenosis, high superior branch with 60%, RCA with 30% proximal, 30% mid and 60% distal  ~ no significant change from cath in 2016.    Consults:  None    Patient Disposition: Home       Patient instructions/medications:     Procedure Specific Activity   *May return to work/school in 2 days.  *May shower after discharge.  *NO lifting, pushing or pulling more than 5 pounds for one week to affected hand. You may use your wrist splint for a reminder!  *NO strenuous activity for 1 week.  *NO driving for 2 days.  *NO baths or swimming for 1 week.  *NO sexual activity for 1 week.     Report These Signs and Symptoms   Please contact your doctor if you have any of the following symptoms: uncontrolled pain, difficulty breathing, chest pain or Arm or hand numbness, tingling, pain, bleeding or loss of pulse.     Questions About Your Stay   For questions or concerns regarding your hospital stay:    - DURING BUSINESS HOURS (8:00 AM - 4:30 PM):    Call [phone] and asked to be transferred to your discharge attending physician.    - AFTER BUSINESS HOURS (4:30 PM - 8:00 AM, on weekends, or holidays):  Call [phone] and ask the operator to page the on-call doctor for the discharge attending physician.   Discharging attending physician: Micheline Rough, ERIC [[id number]]      Low Fat / Low Cholesterol Diet   Your goal is to limit the amount of saturated and trans fats in your diet. Keep track of how much cholesterol you eat and limit the total amount to 200mg  (milligrams) a day.      If you have questions about your diet after you go home, you can call a dietitian at [phone].       Low  Sodium Diet   You will need to monitor the amount of sodium in your diet. Do not eat more than 2g (grams) or 2000mg  (milligrams) per day.    If you have questions regarding your diet at home, you may contact a dietitian at [phone].     Diabetic Diet   You should eat between 1600 and 2000 calories per day.  This is equal to 60g (grams) of carbohydrates per meal, and 30g of carbohydrates for a bedtime snack.    If you have questions about your diet after you go home, you can call a dietitian at [phone].     Return Appointment   Please call Cardiovascular Medicine ( [place] ) Central Scheduling at [phone], Monday through Friday, between 8 a.m. to 5 p.m. to make your follow up hospitalization appointment for 6-8 weeks.   [place] Provider Raelyn Number [[id number]]    Location [facility]      Request for Cardiology Appointment   Standing Status: Future  Standing Exp. Date: 02/14/22   Scheduling Priority: Routine    Schedule OV with (1st choice Provider) Lazarus Gowda M.D.    Special Visit Type Post Hospital Follow-up    Location of Appointment  Medical Center / Mon-Fri    NP/Nurse Provider/Diet Raelyn Number ARNP         Current Discharge Medication List       CONTINUE these medications which have been CHANGED or REFILLED    Details   atorvastatin (LIPITOR) 20 mg tablet Take two tablets by mouth daily.  Qty: 90 tablet, Refills: 3    PRESCRIPTION TYPE:  Normal          CONTINUE these medications which have NOT CHANGED    Details   albuterol (VENTOLIN HFA, PROAIR HFA) 90 mcg/actuation inhaler Inhale 1-2 Puffs by mouth every 4 hours as needed for Wheezing (or shortness of breath).  Qty: 1 Inhaler, Refills: 11    PRESCRIPTION TYPE:  Normal      amLODIPine (NORVASC) 10 mg tablet TAKE 1 TABLET BY MOUTH EVERY DAY  Qty: 30 Tab, Refills: 11    PRESCRIPTION TYPE:  Normal      aspirin EC 81 mg tablet Take 1 Tab by mouth daily.  Qty: 90 Tab, Refills: 3    PRESCRIPTION TYPE:  No Print      buPROPion SR(+) (WELLBUTRIN SR) 150 mg tablet Take 1 Tab by mouth twice daily. Take one tab daily for 3 days then increase to one tab twice a day thereafter  Qty: 60 Tab, Refills: 3    PRESCRIPTION TYPE:  Normal      carvedilol (COREG) 25 mg tablet TAKE 1 TABLET BY MOUTH TWICE A DAY WITH FOOD  Qty: 60 Tab, Refills: 11    PRESCRIPTION TYPE:  Normal clopiDOGrel (PLAVIX) 75 mg tablet Take 75 mg by mouth daily. Indications: For peripheral stents - managed and to be filled by [facility] physician    PRESCRIPTION TYPE:  Historical Med      gabapentin (NEURONTIN) 100 mg PO capsule Take 1 Cap by mouth three times daily.  Qty: 90 Cap, Refills: 5    PRESCRIPTION TYPE:  Normal      hydrALAzine (APRESOLINE) 50 mg tablet TAKE 1 TABLET BY MOUTH 3 TIMES A DAY  Qty: 90 Tab, Refills: 11    PRESCRIPTION TYPE:  Normal  Associated Diagnoses: Essential hypertension      lisinopril (PRINIVIL, ZESTRIL) 40 mg tablet 1 Tab daily.  Qty: 90 Tab, Refills: 3  PRESCRIPTION TYPE:  Normal      nicotine (NICODERM CQ STEP 1) 21 mg/day patch Apply 1 Patch to top of skin as directed daily.      PRESCRIPTION TYPE:  Historical Med      nitroglycerin (NITROSTAT) 0.4 mg tablet Place one tab under tongue every 5 mins not to exceed 3 tabs;as needed for chest pain. If chest pain persists go to the ER or call 911.  Qty: 25 tablet, Refills: 3    PRESCRIPTION TYPE:  Normal      potassium chloride SR (K-DUR) 20 mEq tablet Take 1 Tab by mouth daily. Take with a meal and a full glass of water.  Qty: 90 Cap, Refills: 3    PRESCRIPTION TYPE:  Normal          The following medications were removed from your list. This list includes medications discontinued this stay and those removed from your prior med list in our system        metFORMIN-ER(+) (FORTAMET) 1,000 mg extended release tablet        prednisone (DELTASONE) 20 mg tablet                   Pending items needing follow up:   None      Signed:  Kathie Rhodes, PA-C  02/14/2017      cc:  Primary Care Physician:  Connye Burkitt   Verified  Referring physicians:  [name], MD   Additional provider(s):

## 2017-02-14 NOTE — Progress Notes
Patient arrived on unit via ambulation accompanied by RN. Patient transferred to the bed without assistance. Frailty score equals 3  Assessment completed, refer to flowsheet for details. Orders released, reviewed, and implemented as appropriate. Oriented to surroundings, call light within reach. Plan of care reviewed.  Will continue to monitor and assess.

## 2017-02-14 NOTE — Telephone Encounter
-----   Message from Almyra Deforest, RN sent at 02/12/2017  1:58 PM CDT -----      ----- Message -----  From: Henrene Hawking, MD  Sent: 02/12/2017  11:50 AM  To: Almyra Deforest, RN    -please confirm that he is taking the Lipitor.  If not please restart him on 20 mg/day.  If so increase the dose to 40 mg/day and repeat the labs in 3 months time.  Thanks RG  ----- Message -----  From: Ramond Marrow, LPN  Sent: 45/06/8880  10:10 AM  To: Henrene Hawking, MD    Labs for you review. Med list says he is taking atorvastatin 20 every day but we have not filled it in over two years.

## 2017-02-14 NOTE — Progress Notes
Patient discharged to home with all belongings.  Discharge instructions, med reconciliation and home wound care instructions given and explained to patient and family both verbally and written.  Accompanied by Benjamine Mola, RN.  No complaints of pain or discomfort.   Radial site  remains clean, dry, and intact with no evidence of a bleeding after ambulation.  Patient escorted to lobby via Transport.  Patient to follow up with [facility]) or on-call physician with any additional questions or concerns.  All contact numbers provided.  Patient and family acceptant of DC instuctions and report understanding to all information.

## 2017-02-14 NOTE — Progress Notes
I have reviewed the notes, assessment, and/or procedures performed by Elizabeth Blevins RN and concur with her documentation unless otherwise noted.

## 2017-02-14 NOTE — Telephone Encounter
LMOM asking for a call back to discuss.

## 2017-02-15 ENCOUNTER — Encounter: Admit: 2017-02-15 | Discharge: 2017-02-15

## 2017-02-15 MED ORDER — ATORVASTATIN 40 MG PO TAB
40 mg | ORAL_TABLET | Freq: Every day | ORAL | 3 refills | Status: AC
Start: 2017-02-15 — End: 2019-04-10

## 2017-02-22 NOTE — Telephone Encounter
Called and spoke with pt. He has been taking 20mg  atorva daily. He will increase to 40mg  starting tonight. Sending script. Let him know I will mail labs in about 2.5 months for redraw.

## 2017-03-20 ENCOUNTER — Ambulatory Visit: Admit: 2017-03-20 | Discharge: 2017-03-21 | Payer: MEDICAID

## 2017-03-20 ENCOUNTER — Encounter: Admit: 2017-03-20 | Discharge: 2017-03-20

## 2017-03-20 DIAGNOSIS — G4733 Obstructive sleep apnea (adult) (pediatric): ICD-10-CM

## 2017-03-20 DIAGNOSIS — Z9989 Dependence on other enabling machines and devices: Secondary | ICD-10-CM

## 2017-03-20 DIAGNOSIS — E785 Hyperlipidemia, unspecified: ICD-10-CM

## 2017-03-20 DIAGNOSIS — Z720 Tobacco use: ICD-10-CM

## 2017-03-20 DIAGNOSIS — I2510 Atherosclerotic heart disease of native coronary artery without angina pectoris: ICD-10-CM

## 2017-03-20 DIAGNOSIS — I10 Essential (primary) hypertension: Principal | ICD-10-CM

## 2017-03-20 DIAGNOSIS — N183 Chronic kidney disease, stage 3 (moderate): ICD-10-CM

## 2017-03-20 DIAGNOSIS — J449 Chronic obstructive pulmonary disease, unspecified: ICD-10-CM

## 2017-03-20 DIAGNOSIS — E119 Type 2 diabetes mellitus without complications: ICD-10-CM

## 2017-03-20 DIAGNOSIS — R0600 Dyspnea, unspecified: ICD-10-CM

## 2017-03-20 MED ORDER — CARVEDILOL 12.5 MG PO TAB
12.5 mg | ORAL_TABLET | Freq: Two times a day (BID) | ORAL | 3 refills | 90.00000 days | Status: AC
Start: 2017-03-20 — End: 2017-07-20

## 2017-03-20 NOTE — Progress Notes
Date of Service: 03/20/2017    Sean Henderson is a 61 y.o. male.       HPI    I had the pleasure of seeing Sean Henderson today in follow-up after undergoing coronary angiography last month.  He is a very pleasant 61 year old male with a PMH of a nonischemic cardiomyopathy with recovered ejection fraction, CAD with prior LAD stent placement x 2 with BMS in 2007 and 2009, PAD with prior PTA, hypertension, dyslipidemia, obstructive sleep apnea, CKD, renal cysts, prostate cancer and tobacco dependence.    He saw [name] October and reported some exertional chest pain and shortness of breath.  His symptoms were concerning for accelerating angina and he was referred for coronary angiography and possible PCI.  Heart catheterization on 10/24 showed patent stents in the LAD.  There was a trifurcating OM that had 60% stenosis in both branches, which looked identical to previous angiogram in 2016, when FFR measurement was performed.  There was a tortuous PDA off the RCA, with 60 and 50% stenoses in the proximal and midportion, which appeared to be unchanged to minimally progressed since prior angiogram in 2016.  Echocardiogram on 10/24 showed normal LV EF with normal LV size.  There was mild concentric LVH.  After contrast was given there appeared to be a small area of focal apical hypokinesis.  EF was improved when compared to a prior study.  There were no valvular abnormalities.  Central venous pressure was normal.  PA pressure cannot be estimated.  A lipid panel showed LDL 95 and atorvastatin was increased from 20-40 mg daily.    Sean Henderson continues to report some chest pain, shortness of breath and palpitations with over exertion.  His blood pressure is elevated today.  He reports that his nephrologist at [facility] recently started him on clonidine.  He is unsure of the dose.  He reports that he continues to note some hot flashes since treatment of his prostate cancer.  He feels like his blood pressure has been more labile since he underwent treatment for his prostate cancer.  He denies lightheadedness, dizziness, syncope and presyncope.  He denies orthopnea, PND and leg edema.  He has not been using his CPAP machine.  He thinks he needs a new mask.           Vitals:    03/20/17 0940   BP: 192/86   Pulse: 61   Weight: 104.5 kg (230 lb 6.4 oz)   Height: 1.803 m (5' 11)     Body mass index is 32.13 kg/m???.     Past Medical History  Patient Active Problem List    Diagnosis Date Noted   ??? Prostate cancer (HCC) 03/20/2017   ??? CKD (chronic kidney disease) stage 3, GFR 30-59 ml/min (HCC) 02/14/2017   ??? Unstable angina (HCC) 02/09/2017   ??? Cardiomyopathy (HCC) 01/07/2015   ??? PVD (peripheral vascular disease) (HCC) 11/25/2014   ??? Right flank pain 10/15/2013   ??? SOB (shortness of breath) 04/24/2011   ??? Chest pain 04/24/2011   ??? History of repair of left rotator cuff 06/20/2010     2010     ??? Tobacco dependence 03/02/2010     Peak tobacco consumption 4 ppd     ??? COPD (chronic obstructive pulmonary disease) (HCC) 04/01/2009     A. 2008 PFT FEV1/FEC 68%, FEV 56%, TLC 86%  B. Mod to severe not on  inhalers followed by [facility] Dr Lowella Petties     ???  Obstructive sleep apnea  10/28/2008     A. 07/22/08- Sleep study: [place] Mild obstructive breathing overall,moderate degree supine and during REM sleep non-supine.Mild Desats & mild sleep disturbance.   B. 10/06/08- CPAP titration: to 14cm      ??? HTN (hypertension) 12/09/2007   ??? HLD (hyperlipidemia) 12/09/2007     A. 11/04 total 189 trig 167 HDL 29 LDL 144   B. 1/05 total 98 trig 397 HDL 30 LDL 54 Lipitor 40>> 10/06- start Vytorin 10-20  C. 6/07- total 104, trig 91, HDL 27, LDL 67- Vytorin 10-20  E. 16/1/09- total 160, trig 89, HDL 21, LDL 118 Myalgias:DC vytorin, Rx Crestor 10, niaspan 1000  G. 03/24/09- total 102, trig 154, HDL 25, LDL 60- simvastatin 40, niaspan 1gm  I. 09/12/10- total 106, trig 79,HDL 29, LDL 64- simvastatin 20, niaspan 1gm ??? CAD (coronary artery disease), native coronary artery 12/09/2007     A.2/04 Normal coronaries TMC EF 15%   B. 2/07- Chest pain, admit [place]: Bare metal stent to 90% mLAD, EF 45%  C. 12/09/07 Angina, Cath [place]: BareMetalStent for 80% mLAD, Kissing balloon 80% Dx2   D. 06/17/08- dobutamine stress echo:  LV 5.2. EF 60%. No ischemia.   E. 08/25/09- Dobutamine stress echo: Mild concentric LVH. EF 60%. LA is mildly enlarged. No ischemia.  F.  12/01/10 - Dobut   echo:  Terminated w/ HTN, HR<85%   EF - 60%  LVEDD 6.3 cm.  G  5/13 Reg [facility] EF 52%, no ischemia    12/08/14: Cath by Dr. Micheline Rough - patent prior stent, no obstructive CAD  02/14/17: Cardiac cath: patent LAD stents x 2, Lcx free of dz, OM1 with ostial 60% stenosis, high superior branch with 60%, RCA with 30% proximal, 30% mid and 60% distal  ~ no significant change from cath in 2016.     ??? Dilated cardiomyopathy, initial EF 15% witih normal coronaries 09/11/2006     Dilated cardiomyopathy and late onset CAD  A, 06/10/02: Cardiac cath: [facility]: EF 15%. Normal coronaries.   B. 10/04- Echo: LVIDD 8.1 , EF 25% Valves OK  C. 4/05- Echo: EF 45%. LV 5.5 cm>>> BNP 24. tolerates hydralazine, headaches w/ imdur  D. 2/06, Echo: LV 5.6, EF 50%   E. 2/07- Chest pain, admit [place]: Bare metal stent to 90% mLAD, EF 45%  F. 02/19/07- Dobut echo: LV 6.4, EF 40%, No ischemia, Hypertensive BP response.   G. 12/09/07 Angina, Cath [place]: BareMetalStent for 80% mLAD, Kissing balloon 80% Dx2   H. 2/10 Echo EF 60%  I.  8/11 Dobutamine echo EF 60% no ischemia post stress by EKG or Echo.   J 5/13 Reg [place] EF 52%, no ischemia     ??? Encounter for long-term (current) use of other medications 09/11/2006     Review of Systems   Constitution: Positive for decreased appetite, diaphoresis, fever, weakness and night sweats.   HENT: Positive for congestion, ear discharge, stridor and tinnitus.    Eyes: Positive for blurred vision, discharge, double vision, vision loss in left eye, vision loss in right eye and visual disturbance.   Cardiovascular: Positive for chest pain, claudication, dyspnea on exertion, orthopnea and paroxysmal nocturnal dyspnea.   Respiratory: Positive for cough, shortness of breath, snoring and wheezing.    Endocrine: Positive for heat intolerance and polyuria.   Hematologic/Lymphatic: Negative.    Skin: Positive for dry skin, itching, poor wound healing, rash and unusual hair distribution.   Musculoskeletal: Positive for arthritis, back  pain, joint pain, muscle cramps, muscle weakness, myalgias, neck pain and stiffness.   Gastrointestinal: Negative.    Genitourinary: Negative.    Psychiatric/Behavioral: Negative.    Allergic/Immunologic: Negative.      Physical Exam  General Appearance: no acute distress  Skin: warm & intact  HEENT: unremarkable  Neck Veins: neck veins are flat & not distended  Carotid Arteries: no bruits  Chest Inspection: chest is normal in appearance  Auscultation/Percussion: lungs clear to auscultation, no rales, rhonchi, or wheezing  Cardiac Rhythm: regular rhythm & normal rate  Cardiac Auscultation: Normal S1 & S2, no S3 or S4, no rub  Murmurs: no cardiac murmurs   Extremities: no lower extremity edema; 2+ symmetric distal pulses  Abdominal Exam: soft, non-tender, bowel sounds normal  Neurologic Exam: oriented to time, place and person; no focal neurologic deficits  Psychiatric: Normal mood and affect.  Behavior is normal. Judgment and thought content normal.     Cardiovascular Studies  ECG: sinus rhythm 61 bom. LVH. Nonspecific t wave changes.     Problems Addressed Today  Encounter Diagnoses   Name Primary?   ??? Coronary artery disease involving native coronary artery of native heart with angina pectoris (HCC) Yes   ??? Essential hypertension    ??? Mixed hyperlipidemia    ??? Obstructive sleep apnea     ??? Tobacco dependence    ??? SOB (shortness of breath)    ??? CKD (chronic kidney disease) stage 3, GFR 30-59 ml/min (HCC) ??? Prostate cancer (HCC)      Assessment and Plan   In conclusion, Mr. Gerhart has known CAD.  He has been experiencing anginal symptoms and was referred for coronary angiography, which was done last month.  His LAD stents were widely patent.  He had moderate lesions in the PDA and OM, which did not appear to be significantly different from prior angiogram in 2016.  He continues to have some anginal symptoms with over exertion.  This may be related to uncontrolled blood pressure.  I am going to increase his carvedilol to 37.5 mg twice daily.  He will continue amlodipine 10 mg daily, hydralazine 50 mg 3 times daily and lisinopril 40 mg daily.  His nephrologist started him on clonidine last month.  He has a follow-up appointment in their office next month, as well.  I have asked him to continue to work on following a low-sodium diet.  I have encouraged him to get a new mask for his CPAP machine and start using it again.  This may help with management of his blood pressure and he may note some improvement in the fatigue that he has been experiencing. He will follow up in 6 weeks for re-evaluation. If he continues to note anginal symptoms, we will trial him on Imdur. He had headaches in the past with use of NTG, so he may not tolerate Imdur. His LDL was 95 on a lipid panel last month.  Atorvastatin was increased to 40 mg daily.  I have recommended that he engage in a progressive exercise program, with a goal of 30 minutes of moderate intensity aerobic exercise most days of the week.  We will continue to follow him closely.         Current Medications (including today's revisions)  ??? albuterol (VENTOLIN HFA, PROAIR HFA) 90 mcg/actuation inhaler Inhale 1-2 Puffs by mouth every 4 hours as needed for Wheezing (or shortness of breath).   ??? amLODIPine (NORVASC) 10 mg tablet TAKE 1 TABLET BY MOUTH EVERY  DAY   ??? aspirin EC 81 mg tablet Take 1 Tab by mouth daily. ??? atorvastatin (LIPITOR) 40 mg tablet Take one tablet by mouth daily.   ??? buPROPion SR(+) (WELLBUTRIN SR) 150 mg tablet Take 1 Tab by mouth twice daily. Take one tab daily for 3 days then increase to one tab twice a day thereafter   ??? carvedilol (COREG) 12.5 mg tablet Take one tablet by mouth twice daily with meals. Take with food.   ??? carvedilol (COREG) 25 mg tablet TAKE 1 TABLET BY MOUTH TWICE A DAY WITH FOOD   ??? clopiDOGrel (PLAVIX) 75 mg tablet Take 75 mg by mouth daily. Indications: For peripheral stents - managed and to be filled by [facility] physician   ??? gabapentin (NEURONTIN) 100 mg PO capsule Take 1 Cap by mouth three times daily.   ??? hydrALAzine (APRESOLINE) 50 mg tablet TAKE 1 TABLET BY MOUTH 3 TIMES A DAY   ??? lisinopril (PRINIVIL, ZESTRIL) 40 mg tablet 1 Tab daily.   ??? nicotine (NICODERM CQ STEP 1) 21 mg/day patch Apply 1 Patch to top of skin as directed daily.     ??? nitroglycerin (NITROSTAT) 0.4 mg tablet Place one tab under tongue every 5 mins not to exceed 3 tabs;as needed for chest pain. If chest pain persists go to the ER or call 911.   ??? potassium chloride SR (K-DUR) 20 mEq tablet Take 1 Tab by mouth daily. Take with a meal and a full glass of water.

## 2017-03-20 NOTE — Progress Notes
Date of Service: 03/20/2017    Sean Henderson is a 61 y.o. male.       HPI            Vitals:    03/20/17 0940   BP: 192/86   Pulse: 61   Weight: 104.5 kg (230 lb 6.4 oz)   Height: 1.803 m (5' 11)     Body mass index is 32.13 kg/m???.     Past Medical History  Patient Active Problem List    Diagnosis Date Noted   ??? CKD (chronic kidney disease) stage 3, GFR 30-59 ml/min (HCC) 02/14/2017   ??? Unstable angina (HCC) 02/09/2017   ??? Cardiomyopathy (HCC) 01/07/2015   ??? PVD (peripheral vascular disease) (HCC) 11/25/2014   ??? Right flank pain 10/15/2013   ??? SOB (shortness of breath) 04/24/2011   ??? Chest pain 04/24/2011   ??? History of repair of left rotator cuff 06/20/2010     2010     ??? Tobacco dependence 03/02/2010     Peak tobacco consumption 4 ppd     ??? COPD (chronic obstructive pulmonary disease) (HCC) 04/01/2009     A. 2008 PFT FEV1/FEC 68%, FEV 56%, TLC 86%  B. Mod to severe not on  inhalers followed by [facility] Dr Lowella Petties     ??? Obstructive sleep apnea  10/28/2008     A. 07/22/08- Sleep study: [place] Mild obstructive breathing overall,moderate degree supine and during REM sleep non-supine.Mild Desats & mild sleep disturbance.   B. 10/06/08- CPAP titration: to 14cm      ??? HTN (hypertension) 12/09/2007   ??? HLD (hyperlipidemia) 12/09/2007     A. 11/04 total 189 trig 167 HDL 29 LDL 144   B. 1/05 total 98 trig 397 HDL 30 LDL 54 Lipitor 40>> 10/06- start Vytorin 10-20  C. 6/07- total 104, trig 91, HDL 27, LDL 67- Vytorin 10-20  E. 16/1/09- total 160, trig 89, HDL 21, LDL 118 Myalgias:DC vytorin, Rx Crestor 10, niaspan 1000  G. 03/24/09- total 102, trig 154, HDL 25, LDL 60- simvastatin 40, niaspan 1gm  I. 09/12/10- total 106, trig 79,HDL 29, LDL 64- simvastatin 20, niaspan 1gm     ??? CAD (coronary artery disease), native coronary artery 12/09/2007     A.2/04 Normal coronaries TMC EF 15%   B. 2/07- Chest pain, admit [place]: Bare metal stent to 90% mLAD, EF 45% C. 12/09/07 Angina, Cath [place]: BareMetalStent for 80% mLAD, Kissing balloon 80% Dx2   D. 06/17/08- dobutamine stress echo:  LV 5.2. EF 60%. No ischemia.   E. 08/25/09- Dobutamine stress echo: Mild concentric LVH. EF 60%. LA is mildly enlarged. No ischemia.  F.  12/01/10 - Dobut   echo:  Terminated w/ HTN, HR<85%   EF - 60%  LVEDD 6.3 cm.  G  5/13 Reg Thall EF 52%, no ischemia    12/08/14: Cath by Dr. Micheline Rough - patent prior stent, no obstructive CAD  02/14/17: Cardiac cath: patent LAD stents x 2, Lcx free of dz, OM1 with ostial 60% stenosis, high superior branch with 60%, RCA with 30% proximal, 30% mid and 60% distal  ~ no significant change from cath in 2016.     ??? Dilated cardiomyopathy, initial EF 15% witih normal coronaries 09/11/2006     Dilated cardiomyopathy and late onset CAD  A, 06/10/02: Cardiac cath: [facility]: EF 15%. Normal coronaries.   B. 10/04- Echo: LVIDD 8.1 , EF 25% Valves OK  C. 4/05- Echo: EF 45%. LV 5.5  cm>>> BNP 24. tolerates hydralazine, headaches w/ imdur  D. 2/06, Echo: LV 5.6, EF 50%   E. 2/07- Chest pain, admit : Bare metal stent to 90% mLAD, EF 45%  F. 02/19/07- Dobut echo: LV 6.4, EF 40%, No ischemia, Hypertensive BP response.   G. 12/09/07 Angina, Cath [place]: BareMetalStent for 80% mLAD, Kissing balloon 80% Dx2   H. 2/10 Echo EF 60%  I.  8/11 Dobutamine echo EF 60% no ischemia post stress by EKG or Echo.   J 5/13 Reg Thall EF 52%, no ischemia     ??? Encounter for long-term (current) use of other medications 09/11/2006         ROS    Physical Exam      Cardiovascular Studies      Problems Addressed Today  No diagnosis found.    Assessment and Plan            Current Medications (including today's revisions)  ??? albuterol (VENTOLIN HFA, PROAIR HFA) 90 mcg/actuation inhaler Inhale 1-2 Puffs by mouth every 4 hours as needed for Wheezing (or shortness of breath).   ??? amLODIPine (NORVASC) 10 mg tablet TAKE 1 TABLET BY MOUTH EVERY DAY   ??? aspirin EC 81 mg tablet Take 1 Tab by mouth daily. ??? atorvastatin (LIPITOR) 40 mg tablet Take one tablet by mouth daily.   ??? buPROPion SR(+) (WELLBUTRIN SR) 150 mg tablet Take 1 Tab by mouth twice daily. Take one tab daily for 3 days then increase to one tab twice a day thereafter   ??? carvedilol (COREG) 25 mg tablet TAKE 1 TABLET BY MOUTH TWICE A DAY WITH FOOD   ??? clopiDOGrel (PLAVIX) 75 mg tablet Take 75 mg by mouth daily. Indications: For peripheral stents - managed and to be filled by [facility] physician   ??? gabapentin (NEURONTIN) 100 mg PO capsule Take 1 Cap by mouth three times daily.   ??? hydrALAzine (APRESOLINE) 50 mg tablet TAKE 1 TABLET BY MOUTH 3 TIMES A DAY   ??? lisinopril (PRINIVIL, ZESTRIL) 40 mg tablet 1 Tab daily.   ??? nicotine (NICODERM CQ STEP 1) 21 mg/day patch Apply 1 Patch to top of skin as directed daily.     ??? nitroglycerin (NITROSTAT) 0.4 mg tablet Place one tab under tongue every 5 mins not to exceed 3 tabs;as needed for chest pain. If chest pain persists go to the ER or call 911.   ??? potassium chloride SR (K-DUR) 20 mEq tablet Take 1 Tab by mouth daily. Take with a meal and a full glass of water.

## 2017-03-21 DIAGNOSIS — N183 Chronic kidney disease, stage 3 (moderate): Secondary | ICD-10-CM

## 2017-03-21 DIAGNOSIS — E782 Mixed hyperlipidemia: ICD-10-CM

## 2017-03-21 DIAGNOSIS — I10 Essential (primary) hypertension: ICD-10-CM

## 2017-03-21 DIAGNOSIS — G4733 Obstructive sleep apnea (adult) (pediatric): Secondary | ICD-10-CM

## 2017-03-21 DIAGNOSIS — R0602 Shortness of breath: ICD-10-CM

## 2017-03-21 DIAGNOSIS — I25119 Atherosclerotic heart disease of native coronary artery with unspecified angina pectoris: Principal | ICD-10-CM

## 2017-03-21 DIAGNOSIS — Z955 Presence of coronary angioplasty implant and graft: ICD-10-CM

## 2017-05-03 ENCOUNTER — Encounter: Admit: 2017-05-03 | Discharge: 2017-05-03

## 2017-05-11 ENCOUNTER — Encounter: Admit: 2017-05-11 | Discharge: 2017-05-11

## 2017-05-11 ENCOUNTER — Emergency Department: Admit: 2017-05-11 | Discharge: 2017-05-11

## 2017-05-11 DIAGNOSIS — E119 Type 2 diabetes mellitus without complications: ICD-10-CM

## 2017-05-11 DIAGNOSIS — R7989 Other specified abnormal findings of blood chemistry: ICD-10-CM

## 2017-05-11 DIAGNOSIS — I2510 Atherosclerotic heart disease of native coronary artery without angina pectoris: ICD-10-CM

## 2017-05-11 DIAGNOSIS — R0902 Hypoxemia: ICD-10-CM

## 2017-05-11 DIAGNOSIS — E785 Hyperlipidemia, unspecified: ICD-10-CM

## 2017-05-11 DIAGNOSIS — C61 Malignant neoplasm of prostate: ICD-10-CM

## 2017-05-11 DIAGNOSIS — I10 Essential (primary) hypertension: Principal | ICD-10-CM

## 2017-05-11 DIAGNOSIS — G4733 Obstructive sleep apnea (adult) (pediatric): ICD-10-CM

## 2017-05-11 DIAGNOSIS — Z720 Tobacco use: ICD-10-CM

## 2017-05-11 DIAGNOSIS — N183 Chronic kidney disease, stage 3 (moderate): ICD-10-CM

## 2017-05-11 DIAGNOSIS — I739 Peripheral vascular disease, unspecified: ICD-10-CM

## 2017-05-11 DIAGNOSIS — J449 Chronic obstructive pulmonary disease, unspecified: ICD-10-CM

## 2017-05-11 LAB — BLOOD GASES, PERIPHERAL VENOUS
Lab: 0.1 MMOL/L
Lab: 24 MMOL/L
Lab: 37 mmHg (ref 36–50)
Lab: 59 mmHg — ABNORMAL HIGH (ref 33–48)
Lab: 7.4 — ABNORMAL HIGH (ref 7.30–7.40)
Lab: 92 % — ABNORMAL HIGH (ref 55–71)

## 2017-05-11 LAB — POC TROPONIN
Lab: 0 ng/mL (ref 0.00–0.05)
Lab: 0 ng/mL (ref 0.00–0.05)

## 2017-05-11 LAB — URINALYSIS DIPSTICK REFLEX TO CULTURE
Lab: NEGATIVE
Lab: NEGATIVE
Lab: NEGATIVE
Lab: NEGATIVE
Lab: NEGATIVE
Lab: NEGATIVE

## 2017-05-11 LAB — PROTIME INR (PT): Lab: 1.1 (ref 0.8–1.2)

## 2017-05-11 LAB — HEMOGLOBIN A1C: Lab: 7.3 % — ABNORMAL HIGH (ref 4.0–6.0)

## 2017-05-11 LAB — INFLUENZA A/B AG (RAPID TEST)

## 2017-05-11 LAB — RVP VIRAL PANEL PCR

## 2017-05-11 LAB — PROSTATIC SPECIFIC ANTIGEN-PSA: Lab: 0.2 ng/mL (ref <4.01)

## 2017-05-11 LAB — CREATINE KINASE-CPK: Lab: 169 U/L (ref 35–232)

## 2017-05-11 LAB — COMPREHENSIVE METABOLIC PANEL
Lab: 1.4 mg/dL — ABNORMAL HIGH (ref 0.4–1.24)
Lab: 107 MMOL/L (ref 98–110)
Lab: 116 mg/dL — ABNORMAL HIGH (ref 70–100)
Lab: 138 MMOL/L (ref 137–147)
Lab: 21 mg/dL (ref 7–25)
Lab: 4.3 MMOL/L (ref 3.5–5.1)

## 2017-05-11 LAB — BNP (B-TYPE NATRIURETIC PEPTI): Lab: 130 pg/mL — ABNORMAL HIGH (ref 0–100)

## 2017-05-11 LAB — PROTEIN/CR RATIO,UR RAN
Lab: 0.3
Lab: 194 mg/dL
Lab: 61 mg/dL

## 2017-05-11 LAB — D-DIMER: Lab: 106 ng{FEU}/mL — ABNORMAL HIGH (ref <500)

## 2017-05-11 LAB — PROCALCITONIN: Lab: 0.1 ng/mL — ABNORMAL HIGH (ref <0.10)

## 2017-05-11 LAB — TROPONIN-I: Lab: 0 ng/mL (ref 0.0–0.05)

## 2017-05-11 LAB — CBC AND DIFF: Lab: 6.4 10*3/uL (ref 4.5–11.0)

## 2017-05-11 LAB — PTT (APTT): Lab: 24 s (ref 24.0–36.5)

## 2017-05-11 LAB — POC CREATININE, RAD: Lab: 1.4 mg/dL — ABNORMAL HIGH (ref 0.4–1.24)

## 2017-05-11 LAB — BNP POC ER: Lab: 136 pg/mL — ABNORMAL HIGH (ref 0–100)

## 2017-05-11 LAB — URINALYSIS MICROSCOPIC REFLEX TO CULTURE

## 2017-05-11 MED ORDER — DEXTROMETHORPHAN-GUAIFENESIN 10-100 MG/5 ML PO SYRP
10 mL | ORAL | 0 refills | Status: DC | PRN
Start: 2017-05-11 — End: 2017-05-13

## 2017-05-11 MED ORDER — IPRATROPIUM BROMIDE 0.02 % IN SOLN
0.5 mg | RESPIRATORY_TRACT | 0 refills | Status: DC | PRN
Start: 2017-05-11 — End: 2017-05-13
  Administered 2017-05-11 – 2017-05-13 (×11): 0.5 mg via RESPIRATORY_TRACT

## 2017-05-11 MED ORDER — BISACODYL 10 MG RE SUPP
10 mg | Freq: Two times a day (BID) | RECTAL | 0 refills | Status: DC | PRN
Start: 2017-05-11 — End: 2017-05-13

## 2017-05-11 MED ORDER — ATORVASTATIN 40 MG PO TAB
40 mg | Freq: Every day | ORAL | 0 refills | Status: DC
Start: 2017-05-11 — End: 2017-05-13
  Administered 2017-05-11 – 2017-05-13 (×3): 40 mg via ORAL

## 2017-05-11 MED ORDER — ASPIRIN 81 MG PO CHEW
324 mg | Freq: Once | ORAL | 0 refills | Status: CP
Start: 2017-05-11 — End: ?
  Administered 2017-05-11: 11:00:00 324 mg via ORAL

## 2017-05-11 MED ORDER — PREDNISONE 20 MG PO TAB
40 mg | Freq: Every day | ORAL | 0 refills | Status: DC
Start: 2017-05-11 — End: 2017-05-13
  Administered 2017-05-12 – 2017-05-13 (×2): 40 mg via ORAL

## 2017-05-11 MED ORDER — METHYLPREDNISOLONE SOD SUC(PF) 125 MG/2 ML IJ SOLR
62.5 mg | Freq: Once | INTRAVENOUS | 0 refills | Status: CP
Start: 2017-05-11 — End: ?
  Administered 2017-05-11: 12:00:00 62.5 mg via INTRAVENOUS

## 2017-05-11 MED ORDER — BENZONATATE 100 MG PO CAP
100 mg | Freq: Three times a day (TID) | ORAL | 0 refills | Status: DC | PRN
Start: 2017-05-11 — End: 2017-05-13
  Administered 2017-05-12: 09:00:00 100 mg via ORAL

## 2017-05-11 MED ORDER — CLOPIDOGREL 75 MG PO TAB
75 mg | Freq: Every day | ORAL | 0 refills | Status: DC
Start: 2017-05-11 — End: 2017-05-13
  Administered 2017-05-11 – 2017-05-13 (×3): 75 mg via ORAL

## 2017-05-11 MED ORDER — MELATONIN 5 MG PO TAB
5 mg | Freq: Every evening | ORAL | 0 refills | Status: DC | PRN
Start: 2017-05-11 — End: 2017-05-13

## 2017-05-11 MED ORDER — ASPIRIN 81 MG PO TBEC
81 mg | Freq: Every day | ORAL | 0 refills | Status: DC
Start: 2017-05-11 — End: 2017-05-13
  Administered 2017-05-11 – 2017-05-13 (×3): 81 mg via ORAL

## 2017-05-11 MED ORDER — NICOTINE 21 MG/24 HR TD PT24
1 | Freq: Every day | TRANSDERMAL | 0 refills | Status: DC
Start: 2017-05-11 — End: 2017-05-13
  Administered 2017-05-11 – 2017-05-13 (×3): 1 via TRANSDERMAL

## 2017-05-11 MED ORDER — RP DX TC-99M MAA MCI
5 | Freq: Once | INTRAVENOUS | 0 refills | Status: CP
Start: 2017-05-11 — End: ?
  Administered 2017-05-11: 15:00:00 5.5 via INTRAVENOUS

## 2017-05-11 MED ORDER — HYDRALAZINE 50 MG PO TAB
50 mg | Freq: Three times a day (TID) | ORAL | 0 refills | Status: DC
Start: 2017-05-11 — End: 2017-05-13
  Administered 2017-05-11 – 2017-05-13 (×7): 50 mg via ORAL

## 2017-05-11 MED ORDER — ALBUTEROL SULFATE 2.5 MG/0.5 ML IN NEBU
2.5 mg | RESPIRATORY_TRACT | 0 refills | Status: DC | PRN
Start: 2017-05-11 — End: 2017-05-13
  Administered 2017-05-11 – 2017-05-13 (×12): 2.5 mg via RESPIRATORY_TRACT

## 2017-05-11 MED ORDER — NITROGLYCERIN 0.4 MG SL SUBL
.4 mg | Freq: Once | SUBLINGUAL | 0 refills | Status: CP
Start: 2017-05-11 — End: ?
  Administered 2017-05-11: 11:00:00 0.4 mg via SUBLINGUAL

## 2017-05-11 MED ORDER — ACETAMINOPHEN 325 MG PO TAB
650 mg | ORAL | 0 refills | Status: DC | PRN
Start: 2017-05-11 — End: 2017-05-13
  Administered 2017-05-11 – 2017-05-13 (×4): 650 mg via ORAL

## 2017-05-11 MED ORDER — NICOTINE (POLACRILEX) 4 MG BU GUM
4 mg | BUCCAL | 0 refills | Status: DC | PRN
Start: 2017-05-11 — End: 2017-05-13

## 2017-05-11 MED ORDER — DOXYCYCLINE HYCLATE 100 MG PO TAB
100 mg | Freq: Two times a day (BID) | ORAL | 0 refills | Status: DC
Start: 2017-05-11 — End: 2017-05-13
  Administered 2017-05-11 – 2017-05-13 (×5): 100 mg via ORAL

## 2017-05-11 MED ORDER — RP DX XE-133 XENON MCI
16.2 | Freq: Once | RESPIRATORY_TRACT | 0 refills | Status: CP
Start: 2017-05-11 — End: ?
  Administered 2017-05-11: 15:00:00 16.2 via RESPIRATORY_TRACT

## 2017-05-11 MED ORDER — ALBUTEROL SULFATE 2.5 MG/0.5 ML IN NEBU
2.5 mg | RESPIRATORY_TRACT | 0 refills | Status: DC | PRN
Start: 2017-05-11 — End: 2017-05-11

## 2017-05-11 MED ORDER — NICOTINE (POLACRILEX) 2 MG BU LOZG
2 mg | ORAL | 0 refills | Status: DC | PRN
Start: 2017-05-11 — End: 2017-05-13

## 2017-05-11 MED ORDER — GUAIFENESIN 600 MG PO TA12
600 mg | Freq: Two times a day (BID) | ORAL | 0 refills | Status: DC
Start: 2017-05-11 — End: 2017-05-13
  Administered 2017-05-11 – 2017-05-13 (×5): 600 mg via ORAL

## 2017-05-11 MED ORDER — BUDESONIDE-FORMOTEROL 160-4.5 MCG/ACTUATION IN HFAA
2 | Freq: Two times a day (BID) | RESPIRATORY_TRACT | 0 refills | Status: DC
Start: 2017-05-11 — End: 2017-05-13
  Administered 2017-05-12 – 2017-05-13 (×2): 2 via RESPIRATORY_TRACT

## 2017-05-11 MED ORDER — CEFTRIAXONE INJ 1GM IVP
1 g | INTRAVENOUS | 0 refills | Status: DC
Start: 2017-05-11 — End: 2017-05-13
  Administered 2017-05-11 – 2017-05-13 (×3): 1 g via INTRAVENOUS

## 2017-05-11 MED ORDER — GABAPENTIN 100 MG PO CAP
100 mg | Freq: Three times a day (TID) | ORAL | 0 refills | Status: DC
Start: 2017-05-11 — End: 2017-05-13
  Administered 2017-05-11 – 2017-05-13 (×7): 100 mg via ORAL

## 2017-05-11 MED ORDER — ENOXAPARIN 40 MG/0.4 ML SC SYRG
40 mg | Freq: Every day | SUBCUTANEOUS | 0 refills | Status: DC
Start: 2017-05-11 — End: 2017-05-13
  Administered 2017-05-12 – 2017-05-13 (×2): 40 mg via SUBCUTANEOUS

## 2017-05-11 MED ORDER — LISINOPRIL 20 MG PO TAB
40 mg | Freq: Every day | ORAL | 0 refills | Status: DC
Start: 2017-05-11 — End: 2017-05-13
  Administered 2017-05-11 – 2017-05-13 (×3): 40 mg via ORAL

## 2017-05-11 MED ORDER — ALBUTEROL SULFATE 2.5 MG/0.5 ML IN NEBU
2.5 mg | RESPIRATORY_TRACT | 0 refills | Status: DC
Start: 2017-05-11 — End: 2017-05-11

## 2017-05-11 MED ORDER — AMLODIPINE 10 MG PO TAB
10 mg | Freq: Every day | ORAL | 0 refills | Status: DC
Start: 2017-05-11 — End: 2017-05-13
  Administered 2017-05-11 – 2017-05-13 (×3): 10 mg via ORAL

## 2017-05-11 MED ORDER — CARVEDILOL 12.5 MG PO TAB
12.5 mg | Freq: Two times a day (BID) | ORAL | 0 refills | Status: DC
Start: 2017-05-11 — End: 2017-05-13
  Administered 2017-05-11 – 2017-05-13 (×5): 12.5 mg via ORAL

## 2017-05-11 MED ADMIN — ALBUTEROL SULFATE 2.5 MG/0.5 ML IN NEBU [93139]: 2.5 mg | RESPIRATORY_TRACT | @ 11:00:00 | Stop: 2017-05-11 | NDC 00487990130

## 2017-05-11 MED ADMIN — IPRATROPIUM BROMIDE 0.02 % IN SOLN [12580]: RESPIRATORY_TRACT | @ 11:00:00 | Stop: 2017-05-11 | NDC 00487980101

## 2017-05-12 LAB — LEGIONELLA ANTIGEN URINE,RAN: Lab: NEGATIVE

## 2017-05-12 LAB — GRAM STAIN

## 2017-05-12 LAB — CBC: Lab: 6.5 K/UL — ABNORMAL LOW (ref 4.5–11.0)

## 2017-05-12 LAB — PHOSPHORUS: Lab: 3.6 mg/dL — ABNORMAL HIGH (ref >60)

## 2017-05-12 LAB — MAGNESIUM: Lab: 1.7 mg/dL — ABNORMAL HIGH (ref 1.6–2.6)

## 2017-05-12 LAB — COMPREHENSIVE METABOLIC PANEL: Lab: 137 MMOL/L — ABNORMAL LOW (ref >60)

## 2017-05-12 MED ORDER — SODIUM CHLORIDE-ALOE VERA NA SPRY
1-2 | NASAL | 0 refills | Status: DC | PRN
Start: 2017-05-12 — End: 2017-05-13
  Administered 2017-05-12: 19:00:00 2 via NASAL

## 2017-05-12 MED ORDER — OSELTAMIVIR 75 MG PO CAP
75 mg | Freq: Two times a day (BID) | ORAL | 0 refills | Status: DC
Start: 2017-05-12 — End: 2017-05-13
  Administered 2017-05-12 – 2017-05-13 (×3): 75 mg via ORAL

## 2017-05-13 ENCOUNTER — Emergency Department: Admit: 2017-05-11 | Discharge: 2017-05-11

## 2017-05-13 ENCOUNTER — Inpatient Hospital Stay: Admit: 2017-05-11 | Discharge: 2017-05-11

## 2017-05-13 ENCOUNTER — Inpatient Hospital Stay: Admit: 2017-05-11 | Discharge: 2017-05-13 | Disposition: A | Discharge status: Home / Self Care | Payer: MEDICAID

## 2017-05-13 ENCOUNTER — Inpatient Hospital Stay: Admit: 2017-05-13 | Discharge: 2017-05-13

## 2017-05-13 DIAGNOSIS — Z8546 Personal history of malignant neoplasm of prostate: ICD-10-CM

## 2017-05-13 DIAGNOSIS — N281 Cyst of kidney, acquired: ICD-10-CM

## 2017-05-13 DIAGNOSIS — Z955 Presence of coronary angioplasty implant and graft: ICD-10-CM

## 2017-05-13 DIAGNOSIS — E1151 Type 2 diabetes mellitus with diabetic peripheral angiopathy without gangrene: ICD-10-CM

## 2017-05-13 DIAGNOSIS — J111 Influenza due to unidentified influenza virus with other respiratory manifestations: ICD-10-CM

## 2017-05-13 DIAGNOSIS — J441 Chronic obstructive pulmonary disease with (acute) exacerbation: Principal | ICD-10-CM

## 2017-05-13 DIAGNOSIS — F17210 Nicotine dependence, cigarettes, uncomplicated: ICD-10-CM

## 2017-05-13 DIAGNOSIS — G4733 Obstructive sleep apnea (adult) (pediatric): ICD-10-CM

## 2017-05-13 DIAGNOSIS — N183 Chronic kidney disease, stage 3 (moderate): ICD-10-CM

## 2017-05-13 DIAGNOSIS — Z923 Personal history of irradiation: ICD-10-CM

## 2017-05-13 DIAGNOSIS — E785 Hyperlipidemia, unspecified: ICD-10-CM

## 2017-05-13 DIAGNOSIS — I2510 Atherosclerotic heart disease of native coronary artery without angina pectoris: ICD-10-CM

## 2017-05-13 DIAGNOSIS — I130 Hypertensive heart and chronic kidney disease with heart failure and stage 1 through stage 4 chronic kidney disease, or unspecified chronic kidney disease: ICD-10-CM

## 2017-05-13 DIAGNOSIS — E1122 Type 2 diabetes mellitus with diabetic chronic kidney disease: ICD-10-CM

## 2017-05-13 DIAGNOSIS — I5022 Chronic systolic (congestive) heart failure: ICD-10-CM

## 2017-05-13 DIAGNOSIS — R0789 Other chest pain: ICD-10-CM

## 2017-05-13 LAB — CBC: Lab: 4.6 K/UL (ref 4.5–11.0)

## 2017-05-13 LAB — MAGNESIUM: Lab: 2 mg/dL (ref 1.6–2.6)

## 2017-05-13 LAB — GRAM STAIN

## 2017-05-13 LAB — COMPREHENSIVE METABOLIC PANEL: Lab: 142 MMOL/L — ABNORMAL HIGH (ref >60)

## 2017-05-13 LAB — PHOSPHORUS: Lab: 2.9 mg/dL — ABNORMAL LOW (ref >60)

## 2017-05-13 MED ORDER — PREDNISONE 20 MG PO TAB
40 mg | ORAL_TABLET | Freq: Every day | ORAL | 0 refills | Status: AC
Start: 2017-05-13 — End: ?

## 2017-05-13 MED ORDER — GUAIFENESIN 600 MG PO TA12
600 mg | ORAL_TABLET | Freq: Two times a day (BID) | ORAL | 0 refills | 14.00000 days | Status: AC
Start: 2017-05-13 — End: 2017-10-18

## 2017-05-13 MED ORDER — LEVOFLOXACIN 750 MG PO TAB
750 mg | ORAL_TABLET | Freq: Every day | ORAL | 0 refills | 7.00000 days | Status: AC
Start: 2017-05-13 — End: 2017-09-26

## 2017-05-13 MED ORDER — OSELTAMIVIR 75 MG PO CAP
75 mg | ORAL_CAPSULE | Freq: Two times a day (BID) | ORAL | 0 refills | Status: AC
Start: 2017-05-13 — End: 2017-09-26

## 2017-05-13 MED ORDER — BUDESONIDE-FORMOTEROL 160-4.5 MCG/ACTUATION IN HFAA
2 | Freq: Two times a day (BID) | RESPIRATORY_TRACT | 3 refills | 30.00000 days | Status: AC
Start: 2017-05-13 — End: 2019-03-12

## 2017-05-13 MED ORDER — BENZONATATE 100 MG PO CAP
100 mg | ORAL_CAPSULE | Freq: Three times a day (TID) | ORAL | 0 refills | 9.00000 days | Status: AC | PRN
Start: 2017-05-13 — End: 2017-10-23

## 2017-05-14 LAB — CULTURE-RESP,LOWER W/SENSITIVITY: Lab: LOW 10*3/uL (ref 0–0.45)

## 2017-05-17 LAB — CULTURE-BLOOD W/SENSITIVITY

## 2017-07-17 ENCOUNTER — Encounter: Admit: 2017-07-17 | Discharge: 2017-07-17

## 2017-07-20 MED ORDER — CARVEDILOL 12.5 MG PO TAB
12.5 mg | ORAL_TABLET | Freq: Two times a day (BID) | ORAL | 3 refills | 90.00000 days | Status: AC
Start: 2017-07-20 — End: 2017-09-26

## 2017-07-26 ENCOUNTER — Encounter: Admit: 2017-07-26 | Discharge: 2017-07-26

## 2017-07-26 DIAGNOSIS — J449 Chronic obstructive pulmonary disease, unspecified: ICD-10-CM

## 2017-07-26 DIAGNOSIS — I2510 Atherosclerotic heart disease of native coronary artery without angina pectoris: ICD-10-CM

## 2017-07-26 DIAGNOSIS — I10 Essential (primary) hypertension: Principal | ICD-10-CM

## 2017-07-26 DIAGNOSIS — I739 Peripheral vascular disease, unspecified: ICD-10-CM

## 2017-07-26 DIAGNOSIS — N183 Chronic kidney disease, stage 3 (moderate): ICD-10-CM

## 2017-07-26 DIAGNOSIS — Z720 Tobacco use: ICD-10-CM

## 2017-07-26 DIAGNOSIS — E119 Type 2 diabetes mellitus without complications: ICD-10-CM

## 2017-07-26 DIAGNOSIS — C61 Malignant neoplasm of prostate: ICD-10-CM

## 2017-07-26 DIAGNOSIS — G4733 Obstructive sleep apnea (adult) (pediatric): ICD-10-CM

## 2017-07-26 DIAGNOSIS — E785 Hyperlipidemia, unspecified: ICD-10-CM

## 2017-09-25 ENCOUNTER — Encounter: Admit: 2017-09-25 | Discharge: 2017-09-25

## 2017-09-26 ENCOUNTER — Ambulatory Visit: Admit: 2017-09-26 | Discharge: 2017-09-26

## 2017-09-26 ENCOUNTER — Encounter: Admit: 2017-09-26 | Discharge: 2017-09-26

## 2017-09-26 ENCOUNTER — Ambulatory Visit: Admit: 2017-09-26 | Discharge: 2017-09-26 | Payer: MEDICAID

## 2017-09-26 DIAGNOSIS — N183 Chronic kidney disease, stage 3 (moderate): ICD-10-CM

## 2017-09-26 DIAGNOSIS — J449 Chronic obstructive pulmonary disease, unspecified: ICD-10-CM

## 2017-09-26 DIAGNOSIS — I739 Peripheral vascular disease, unspecified: ICD-10-CM

## 2017-09-26 DIAGNOSIS — E785 Hyperlipidemia, unspecified: ICD-10-CM

## 2017-09-26 DIAGNOSIS — Z720 Tobacco use: ICD-10-CM

## 2017-09-26 DIAGNOSIS — I2510 Atherosclerotic heart disease of native coronary artery without angina pectoris: Secondary | ICD-10-CM

## 2017-09-26 DIAGNOSIS — G4733 Obstructive sleep apnea (adult) (pediatric): ICD-10-CM

## 2017-09-26 DIAGNOSIS — I10 Essential (primary) hypertension: Principal | ICD-10-CM

## 2017-09-26 DIAGNOSIS — C61 Malignant neoplasm of prostate: ICD-10-CM

## 2017-09-26 DIAGNOSIS — I429 Cardiomyopathy, unspecified: Principal | ICD-10-CM

## 2017-09-26 DIAGNOSIS — E119 Type 2 diabetes mellitus without complications: ICD-10-CM

## 2017-09-26 LAB — BASIC METABOLIC PANEL
Lab: 141 MMOL/L (ref 137–147)
Lab: 22 MMOL/L (ref 21–30)
Lab: 4.5 MMOL/L (ref 3.5–5.1)
Lab: 91 mg/dL (ref 70–100)

## 2017-10-18 ENCOUNTER — Encounter: Admit: 2017-10-18 | Discharge: 2017-10-18

## 2017-10-18 ENCOUNTER — Ambulatory Visit: Admit: 2017-10-18 | Discharge: 2017-10-18

## 2017-10-18 ENCOUNTER — Ambulatory Visit: Admit: 2017-10-18 | Discharge: 2017-10-18 | Payer: MEDICAID

## 2017-10-18 DIAGNOSIS — Z720 Tobacco use: ICD-10-CM

## 2017-10-18 DIAGNOSIS — F17200 Nicotine dependence, unspecified, uncomplicated: ICD-10-CM

## 2017-10-18 DIAGNOSIS — I2510 Atherosclerotic heart disease of native coronary artery without angina pectoris: Secondary | ICD-10-CM

## 2017-10-18 DIAGNOSIS — I429 Cardiomyopathy, unspecified: Principal | ICD-10-CM

## 2017-10-18 DIAGNOSIS — J449 Chronic obstructive pulmonary disease, unspecified: ICD-10-CM

## 2017-10-18 DIAGNOSIS — I10 Essential (primary) hypertension: ICD-10-CM

## 2017-10-18 DIAGNOSIS — C61 Malignant neoplasm of prostate: ICD-10-CM

## 2017-10-18 DIAGNOSIS — E119 Type 2 diabetes mellitus without complications: ICD-10-CM

## 2017-10-18 DIAGNOSIS — N183 Chronic kidney disease, stage 3 (moderate): ICD-10-CM

## 2017-10-18 DIAGNOSIS — G4733 Obstructive sleep apnea (adult) (pediatric): ICD-10-CM

## 2017-10-18 DIAGNOSIS — I739 Peripheral vascular disease, unspecified: ICD-10-CM

## 2017-10-18 DIAGNOSIS — E785 Hyperlipidemia, unspecified: ICD-10-CM

## 2017-10-18 DIAGNOSIS — I428 Other cardiomyopathies: Secondary | ICD-10-CM

## 2017-10-18 MED ORDER — SACUBITRIL-VALSARTAN 49-51 MG PO TAB
1 | ORAL_TABLET | Freq: Two times a day (BID) | ORAL | 11 refills | Status: AC
Start: 2017-10-18 — End: 2017-10-18

## 2017-10-18 MED ORDER — PERFLUTREN LIPID MICROSPHERES 1.1 MG/ML IV SUSP
1-20 mL | Freq: Once | INTRAVENOUS | 0 refills | Status: CP | PRN
Start: 2017-10-18 — End: ?
  Administered 2017-10-18: 16:00:00 1 mL via INTRAVENOUS

## 2017-10-19 ENCOUNTER — Encounter: Admit: 2017-10-19 | Discharge: 2017-10-19

## 2017-10-21 MED ORDER — SACUBITRIL-VALSARTAN 24-26 MG PO TAB
1 | ORAL_TABLET | Freq: Two times a day (BID) | ORAL | 3 refills | Status: AC
Start: 2017-10-21 — End: 2017-12-11
  Filled 2017-10-23 (×2): qty 60, 30d supply, fill #1

## 2017-10-22 ENCOUNTER — Encounter: Admit: 2017-10-22 | Discharge: 2017-10-22

## 2017-10-23 ENCOUNTER — Ambulatory Visit: Admit: 2017-10-23 | Discharge: 2017-10-23 | Payer: MEDICAID

## 2017-10-23 ENCOUNTER — Encounter: Admit: 2017-10-23 | Discharge: 2017-10-23

## 2017-10-23 ENCOUNTER — Ambulatory Visit: Admit: 2017-10-23 | Discharge: 2017-10-23

## 2017-10-23 DIAGNOSIS — I10 Essential (primary) hypertension: ICD-10-CM

## 2017-10-23 DIAGNOSIS — I429 Cardiomyopathy, unspecified: Principal | ICD-10-CM

## 2017-10-23 DIAGNOSIS — F17200 Nicotine dependence, unspecified, uncomplicated: ICD-10-CM

## 2017-10-23 DIAGNOSIS — I428 Other cardiomyopathies: ICD-10-CM

## 2017-10-23 DIAGNOSIS — I2510 Atherosclerotic heart disease of native coronary artery without angina pectoris: ICD-10-CM

## 2017-10-23 DIAGNOSIS — G4733 Obstructive sleep apnea (adult) (pediatric): ICD-10-CM

## 2017-10-23 DIAGNOSIS — I739 Peripheral vascular disease, unspecified: ICD-10-CM

## 2017-10-23 LAB — BASIC METABOLIC PANEL
Lab: 1.6 mg/dL — ABNORMAL HIGH (ref 0.4–1.24)
Lab: 109 MMOL/L (ref 98–110)
Lab: 139 MMOL/L (ref 137–147)
Lab: 24 MMOL/L (ref 21–30)
Lab: 30 mg/dL — ABNORMAL HIGH (ref 7–25)
Lab: 4.4 MMOL/L (ref 3.5–5.1)
Lab: 44 mL/min — ABNORMAL LOW (ref >60)
Lab: 53 mL/min — ABNORMAL LOW (ref >60)
Lab: 79 mg/dL (ref 70–100)
Lab: 9.2 mg/dL (ref 8.5–10.6)
Lab: 6 (ref 3–12)

## 2017-10-23 LAB — CBC
Lab: 15 g/dL (ref 13.5–16.5)
Lab: 16 % — ABNORMAL HIGH (ref 11–15)
Lab: 246 10*3/uL (ref 150–400)
Lab: 48 % (ref 40–50)
Lab: 5.8 M/UL — ABNORMAL HIGH (ref 4.4–5.5)
Lab: 6.4 K/UL (ref 4.5–11.0)
Lab: 8.2 FL (ref 7–11)
Lab: 82 FL (ref 80–100)

## 2017-10-23 LAB — BNP (B-TYPE NATRIURETIC PEPTI): Lab: 467 pg/mL — ABNORMAL HIGH (ref 0–100)

## 2017-10-25 ENCOUNTER — Encounter: Admit: 2017-10-25 | Discharge: 2017-10-25

## 2017-10-26 ENCOUNTER — Encounter: Admit: 2017-10-26 | Discharge: 2017-10-26

## 2017-10-30 ENCOUNTER — Encounter: Admit: 2017-10-30 | Discharge: 2017-10-30

## 2017-10-31 ENCOUNTER — Encounter: Admit: 2017-10-31 | Discharge: 2017-10-31

## 2017-11-06 ENCOUNTER — Encounter: Admit: 2017-11-06 | Discharge: 2017-11-06

## 2017-11-28 ENCOUNTER — Encounter: Admit: 2017-11-28 | Discharge: 2017-11-28

## 2017-11-29 MED FILL — SACUBITRIL-VALSARTAN 24-26 MG PO TAB: 24-26 mg | ORAL | 30 days supply | Qty: 60 | Fill #2 | Status: CP

## 2017-12-11 ENCOUNTER — Encounter: Admit: 2017-12-11 | Discharge: 2017-12-11

## 2017-12-11 ENCOUNTER — Ambulatory Visit: Admit: 2017-12-11 | Discharge: 2017-12-11

## 2017-12-11 ENCOUNTER — Ambulatory Visit: Admit: 2017-12-11 | Discharge: 2017-12-11 | Payer: MEDICAID

## 2017-12-11 DIAGNOSIS — E785 Hyperlipidemia, unspecified: ICD-10-CM

## 2017-12-11 DIAGNOSIS — I739 Peripheral vascular disease, unspecified: ICD-10-CM

## 2017-12-11 DIAGNOSIS — N183 Chronic kidney disease, stage 3 (moderate): ICD-10-CM

## 2017-12-11 DIAGNOSIS — I2510 Atherosclerotic heart disease of native coronary artery without angina pectoris: Principal | ICD-10-CM

## 2017-12-11 DIAGNOSIS — I10 Essential (primary) hypertension: ICD-10-CM

## 2017-12-11 DIAGNOSIS — R9431 Abnormal electrocardiogram [ECG] [EKG]: ICD-10-CM

## 2017-12-11 DIAGNOSIS — E119 Type 2 diabetes mellitus without complications: ICD-10-CM

## 2017-12-11 DIAGNOSIS — C61 Malignant neoplasm of prostate: ICD-10-CM

## 2017-12-11 DIAGNOSIS — I428 Other cardiomyopathies: ICD-10-CM

## 2017-12-11 DIAGNOSIS — J449 Chronic obstructive pulmonary disease, unspecified: ICD-10-CM

## 2017-12-11 DIAGNOSIS — Z720 Tobacco use: ICD-10-CM

## 2017-12-11 DIAGNOSIS — G4733 Obstructive sleep apnea (adult) (pediatric): ICD-10-CM

## 2017-12-11 LAB — BASIC METABOLIC PANEL
Lab: 1.3 mg/dL — ABNORMAL HIGH (ref 0.4–1.24)
Lab: 139 MMOL/L (ref 137–147)
Lab: 24 mg/dL (ref 7–25)
Lab: 25 MMOL/L (ref 21–30)
Lab: 4 MMOL/L (ref 3.5–5.1)
Lab: 5 (ref 3–12)
Lab: 52 mL/min — ABNORMAL LOW (ref >60)
Lab: 9.3 mg/dL (ref 8.5–10.6)
Lab: 99 mg/dL (ref 70–100)
Lab: >60 mL/min (ref >60)

## 2017-12-11 LAB — BNP (B-TYPE NATRIURETIC PEPTI): Lab: 202 pg/mL — ABNORMAL HIGH (ref 0–100)

## 2017-12-11 MED ORDER — SACUBITRIL-VALSARTAN 49-51 MG PO TAB
1 | ORAL_TABLET | Freq: Two times a day (BID) | ORAL | 11 refills | Status: AC
Start: 2017-12-11 — End: 2018-06-05
  Filled 2017-12-11: qty 60, 30d supply

## 2017-12-14 ENCOUNTER — Encounter: Admit: 2017-12-14 | Discharge: 2017-12-14

## 2017-12-17 ENCOUNTER — Encounter: Admit: 2017-12-17 | Discharge: 2017-12-17

## 2017-12-19 ENCOUNTER — Ambulatory Visit: Admit: 2017-12-19 | Discharge: 2017-12-19 | Payer: MEDICAID

## 2017-12-19 DIAGNOSIS — I2510 Atherosclerotic heart disease of native coronary artery without angina pectoris: Principal | ICD-10-CM

## 2017-12-19 MED ORDER — ALBUTEROL SULFATE 90 MCG/ACTUATION IN HFAA
2 | RESPIRATORY_TRACT | 0 refills | Status: DC | PRN
Start: 2017-12-19 — End: 2017-12-24

## 2017-12-19 MED ORDER — REGADENOSON 0.4 MG/5 ML IV SYRG
.4 mg | Freq: Once | INTRAVENOUS | 0 refills | Status: CP
Start: 2017-12-19 — End: ?
  Administered 2017-12-19: 16:00:00 0.4 mg via INTRAVENOUS

## 2017-12-19 MED ORDER — SODIUM CHLORIDE 0.9 % IV SOLP
250 mL | INTRAVENOUS | 0 refills | Status: AC | PRN
Start: 2017-12-19 — End: ?

## 2017-12-19 MED ORDER — NITROGLYCERIN 0.4 MG SL SUBL
.4 mg | SUBLINGUAL | 0 refills | Status: AC | PRN
Start: 2017-12-19 — End: ?

## 2017-12-19 MED ORDER — AMINOPHYLLINE 500 MG/20 ML IV SOLN
50 mg | INTRAVENOUS | 0 refills | Status: AC | PRN
Start: 2017-12-19 — End: ?
  Administered 2017-12-19: 16:00:00 50 mg via INTRAVENOUS

## 2017-12-21 ENCOUNTER — Encounter: Admit: 2017-12-21 | Discharge: 2017-12-21

## 2017-12-31 ENCOUNTER — Encounter: Admit: 2017-12-31 | Discharge: 2017-12-31

## 2017-12-31 MED FILL — SACUBITRIL-VALSARTAN 49-51 MG PO TAB: 49-51 mg | ORAL | 30 days supply | Qty: 60 | Fill #1 | Status: CP

## 2018-01-04 ENCOUNTER — Encounter: Admit: 2018-01-04 | Discharge: 2018-01-04

## 2018-01-04 DIAGNOSIS — R9439 Abnormal result of other cardiovascular function study: Principal | ICD-10-CM

## 2018-01-04 DIAGNOSIS — I2510 Atherosclerotic heart disease of native coronary artery without angina pectoris: Principal | ICD-10-CM

## 2018-01-04 MED ORDER — PREDNISONE 20 MG PO TAB
ORAL_TABLET | 0 refills | Status: AC
Start: 2018-01-04 — End: 2018-01-10
  Filled 2018-01-09 (×2): qty 6, 2d supply, fill #1

## 2018-01-04 MED ORDER — ASPIRIN 325 MG PO TAB
325 mg | Freq: Once | ORAL | 0 refills | Status: CN
Start: 2018-01-04 — End: ?

## 2018-01-04 MED ORDER — IMS MIXTURE TEMPLATE
60 mg | Freq: Once | ORAL | 0 refills | Status: CN
Start: 2018-01-04 — End: ?

## 2018-01-07 ENCOUNTER — Encounter: Admit: 2018-01-07 | Discharge: 2018-01-07

## 2018-01-09 ENCOUNTER — Ambulatory Visit: Admit: 2018-01-09 | Discharge: 2018-01-09 | Payer: MEDICAID

## 2018-01-09 ENCOUNTER — Encounter: Admit: 2018-01-09 | Discharge: 2018-01-09

## 2018-01-09 DIAGNOSIS — I2510 Atherosclerotic heart disease of native coronary artery without angina pectoris: Principal | ICD-10-CM

## 2018-01-09 LAB — CBC
Lab: 16 g/dL (ref 13.5–16.5)
Lab: 17 % — ABNORMAL HIGH (ref >60)
Lab: 211 10*3/uL — ABNORMAL LOW (ref >60)
Lab: 27 pg — ABNORMAL HIGH (ref 26–34)
Lab: 32 g/dL (ref 32.0–36.0)
Lab: 49 % — ABNORMAL HIGH (ref 40–50)
Lab: 5.8 M/UL — ABNORMAL HIGH (ref 4.4–5.5)
Lab: 6.4 K/UL (ref 4.5–11.0)
Lab: 8.2 FL (ref 7–11)
Lab: 84 FL (ref 80–100)

## 2018-01-09 LAB — BASIC METABOLIC PANEL
Lab: 141 MMOL/L (ref 137–147)
Lab: 3.9 MMOL/L (ref 3.5–5.1)

## 2018-01-10 ENCOUNTER — Encounter: Admit: 2018-01-10 | Discharge: 2018-01-10

## 2018-01-10 ENCOUNTER — Ambulatory Visit: Admit: 2018-01-10 | Discharge: 2018-01-10 | Payer: MEDICAID

## 2018-01-10 DIAGNOSIS — N183 Chronic kidney disease, stage 3 (moderate): ICD-10-CM

## 2018-01-10 DIAGNOSIS — Z720 Tobacco use: ICD-10-CM

## 2018-01-10 DIAGNOSIS — C61 Malignant neoplasm of prostate: ICD-10-CM

## 2018-01-10 DIAGNOSIS — I428 Other cardiomyopathies: ICD-10-CM

## 2018-01-10 DIAGNOSIS — E782 Mixed hyperlipidemia: ICD-10-CM

## 2018-01-10 DIAGNOSIS — E119 Type 2 diabetes mellitus without complications: ICD-10-CM

## 2018-01-10 DIAGNOSIS — F17200 Nicotine dependence, unspecified, uncomplicated: ICD-10-CM

## 2018-01-10 DIAGNOSIS — G4733 Obstructive sleep apnea (adult) (pediatric): ICD-10-CM

## 2018-01-10 DIAGNOSIS — I10 Essential (primary) hypertension: Principal | ICD-10-CM

## 2018-01-10 DIAGNOSIS — I2510 Atherosclerotic heart disease of native coronary artery without angina pectoris: Secondary | ICD-10-CM

## 2018-01-10 DIAGNOSIS — I739 Peripheral vascular disease, unspecified: ICD-10-CM

## 2018-01-10 DIAGNOSIS — J449 Chronic obstructive pulmonary disease, unspecified: ICD-10-CM

## 2018-01-10 DIAGNOSIS — E785 Hyperlipidemia, unspecified: ICD-10-CM

## 2018-01-10 MED ORDER — TEMAZEPAM 15 MG PO CAP
15 mg | Freq: Every evening | ORAL | 0 refills | Status: DC | PRN
Start: 2018-01-10 — End: 2018-01-11

## 2018-01-10 MED ORDER — ASPIRIN 325 MG PO TAB
325 mg | Freq: Once | ORAL | 0 refills | Status: CP
Start: 2018-01-10 — End: ?
  Administered 2018-01-10: 14:00:00 325 mg via ORAL

## 2018-01-10 MED ORDER — ONDANSETRON HCL (PF) 4 MG/2 ML IJ SOLN
4 mg | INTRAVENOUS | 0 refills | Status: DC | PRN
Start: 2018-01-10 — End: 2018-01-11

## 2018-01-10 MED ORDER — DIPHENHYDRAMINE HCL 25 MG PO CAP
25 mg | ORAL | 0 refills | Status: DC | PRN
Start: 2018-01-10 — End: 2018-01-11

## 2018-01-10 MED ORDER — DIPHENHYDRAMINE HCL 50 MG PO CAP
50 mg | Freq: Once | ORAL | 0 refills | Status: CP
Start: 2018-01-10 — End: ?
  Administered 2018-01-10: 16:00:00 50 mg via ORAL

## 2018-01-10 MED ORDER — ACETAMINOPHEN 325 MG PO TAB
650 mg | ORAL | 0 refills | Status: DC | PRN
Start: 2018-01-10 — End: 2018-01-11

## 2018-01-10 MED ORDER — PREDNISONE 20 MG PO TAB
60 mg | Freq: Once | ORAL | 0 refills | Status: DC
Start: 2018-01-10 — End: 2018-01-10

## 2018-01-10 MED ORDER — ALUMINUM-MAGNESIUM HYDROXIDE 200-200 MG/5 ML PO SUSP
30 mL | ORAL | 0 refills | Status: DC | PRN
Start: 2018-01-10 — End: 2018-01-11

## 2018-01-10 MED ORDER — FAMOTIDINE 20 MG PO TAB
20 mg | Freq: Once | ORAL | 0 refills | Status: CP
Start: 2018-01-10 — End: ?
  Administered 2018-01-10: 14:00:00 20 mg via ORAL

## 2018-01-10 MED ORDER — SPIRONOLACTONE 25 MG PO TAB
25 mg | Freq: Every day | ORAL | 0 refills | Status: DC
Start: 2018-01-10 — End: 2018-01-11
  Administered 2018-01-10: 18:00:00 25 mg via ORAL

## 2018-01-10 MED ORDER — NICOTINE 21 MG/24 HR TD PT24
1 | MEDICATED_PATCH | TRANSDERMAL | 2 refills | Status: AC
Start: 2018-01-10 — End: ?

## 2018-01-10 MED ORDER — DIPHENHYDRAMINE HCL 50 MG/ML IJ SOLN
25 mg | INTRAVENOUS | 0 refills | Status: DC | PRN
Start: 2018-01-10 — End: 2018-01-11

## 2018-01-10 MED ORDER — HYDRALAZINE 10 MG PO TAB
50 mg | Freq: Once | ORAL | 0 refills | Status: CP
Start: 2018-01-10 — End: ?
  Administered 2018-01-10: 18:00:00 50 mg via ORAL

## 2018-01-10 MED ORDER — NICOTINE 10 MG/ML NA SPRY
RESPIRATORY_TRACT | 3 refills | 28.00000 days | Status: AC
Start: 2018-01-10 — End: 2019-05-28
  Filled 2018-01-11: qty 28, 28d supply, fill #1
  Filled 2018-01-11: qty 40, 10d supply, fill #1
  Filled 2018-01-11: qty 28, 28d supply, fill #1
  Filled 2018-01-11: qty 40, 10d supply, fill #1

## 2018-01-10 MED ORDER — SODIUM CHLORIDE 0.9 % IV SOLP
1000 mL | INTRAVENOUS | 0 refills | Status: DC
Start: 2018-01-10 — End: 2018-01-11
  Administered 2018-01-10: 14:00:00 1000 mL via INTRAVENOUS

## 2018-01-10 MED ORDER — PREDNISONE 20 MG PO TAB
60 mg | Freq: Once | ORAL | 0 refills | Status: DC
Start: 2018-01-10 — End: 2018-01-11

## 2018-01-11 ENCOUNTER — Encounter: Admit: 2018-01-11 | Discharge: 2018-01-11

## 2018-01-11 LAB — POC ACTIVATED CLOTTING TIME: Lab: 212 s

## 2018-01-25 ENCOUNTER — Ambulatory Visit: Admit: 2018-01-25 | Discharge: 2018-01-25 | Payer: MEDICAID

## 2018-01-25 DIAGNOSIS — I428 Other cardiomyopathies: Principal | ICD-10-CM

## 2018-01-25 MED ORDER — PERFLUTREN LIPID MICROSPHERES 1.1 MG/ML IV SUSP
1-20 mL | Freq: Once | INTRAVENOUS | 0 refills | Status: CP | PRN
Start: 2018-01-25 — End: ?
  Administered 2018-01-25: 16:00:00 1.5 mL via INTRAVENOUS

## 2018-02-04 ENCOUNTER — Encounter: Admit: 2018-02-04 | Discharge: 2018-02-04

## 2018-02-04 MED FILL — SACUBITRIL-VALSARTAN 49-51 MG PO TAB: 49-51 mg | ORAL | 30 days supply | Qty: 60 | Fill #2 | Status: CP

## 2018-03-04 ENCOUNTER — Ambulatory Visit: Admit: 2018-03-04 | Discharge: 2018-03-04

## 2018-03-04 ENCOUNTER — Encounter: Admit: 2018-03-04 | Discharge: 2018-03-04

## 2018-03-04 ENCOUNTER — Ambulatory Visit: Admit: 2018-03-04 | Discharge: 2018-03-04 | Payer: MEDICAID

## 2018-03-04 DIAGNOSIS — F4321 Adjustment disorder with depressed mood: ICD-10-CM

## 2018-03-04 DIAGNOSIS — I428 Other cardiomyopathies: Principal | ICD-10-CM

## 2018-03-04 DIAGNOSIS — E782 Mixed hyperlipidemia: ICD-10-CM

## 2018-03-04 DIAGNOSIS — Z941 Heart transplant status: ICD-10-CM

## 2018-03-04 DIAGNOSIS — N183 Chronic kidney disease, stage 3 (moderate): ICD-10-CM

## 2018-03-04 DIAGNOSIS — N281 Cyst of kidney, acquired: ICD-10-CM

## 2018-03-04 DIAGNOSIS — I2510 Atherosclerotic heart disease of native coronary artery without angina pectoris: ICD-10-CM

## 2018-03-04 DIAGNOSIS — I10 Essential (primary) hypertension: Principal | ICD-10-CM

## 2018-03-04 DIAGNOSIS — Z720 Tobacco use: ICD-10-CM

## 2018-03-04 DIAGNOSIS — G4733 Obstructive sleep apnea (adult) (pediatric): ICD-10-CM

## 2018-03-04 DIAGNOSIS — C61 Malignant neoplasm of prostate: ICD-10-CM

## 2018-03-04 DIAGNOSIS — J449 Chronic obstructive pulmonary disease, unspecified: ICD-10-CM

## 2018-03-04 DIAGNOSIS — I739 Peripheral vascular disease, unspecified: ICD-10-CM

## 2018-03-04 DIAGNOSIS — E785 Hyperlipidemia, unspecified: ICD-10-CM

## 2018-03-04 DIAGNOSIS — E119 Type 2 diabetes mellitus without complications: ICD-10-CM

## 2018-03-04 LAB — BASIC METABOLIC PANEL
Lab: 1.3 mg/dL — ABNORMAL HIGH (ref 0.4–1.24)
Lab: 109 MMOL/L (ref 98–110)
Lab: 143 MMOL/L (ref 137–147)
Lab: 17 mg/dL (ref 7–25)
Lab: 24 MMOL/L (ref 21–30)
Lab: 3.8 MMOL/L (ref 3.5–5.1)
Lab: 52 mL/min — ABNORMAL LOW (ref >60)
Lab: 9.2 mg/dL (ref 8.5–10.6)
Lab: 91 mg/dL (ref 70–100)
Lab: >60 mL/min (ref >60)
Lab: 10 (ref 3–12)

## 2018-03-06 ENCOUNTER — Encounter: Admit: 2018-03-06 | Discharge: 2018-03-06

## 2018-03-06 DIAGNOSIS — Q613 Polycystic kidney, unspecified: Principal | ICD-10-CM

## 2018-03-08 ENCOUNTER — Encounter: Admit: 2018-03-08 | Discharge: 2018-03-08

## 2018-03-12 ENCOUNTER — Encounter: Admit: 2018-03-12 | Discharge: 2018-03-12

## 2018-03-12 MED FILL — SACUBITRIL-VALSARTAN 49-51 MG PO TAB: 49-51 mg | ORAL | 30 days supply | Qty: 60 | Fill #3 | Status: CP

## 2018-04-13 ENCOUNTER — Encounter: Admit: 2018-04-13 | Discharge: 2018-04-13

## 2018-04-13 MED FILL — SACUBITRIL-VALSARTAN 49-51 MG PO TAB: 49-51 mg | ORAL | 30 days supply | Qty: 60 | Fill #4 | Status: CP

## 2018-05-17 ENCOUNTER — Encounter: Admit: 2018-05-17 | Discharge: 2018-05-17

## 2018-05-17 MED FILL — SACUBITRIL-VALSARTAN 49-51 MG PO TAB: 49-51 mg | ORAL | 30 days supply | Qty: 60 | Fill #5 | Status: CP

## 2018-06-04 ENCOUNTER — Ambulatory Visit: Admit: 2018-06-04 | Discharge: 2018-06-04

## 2018-06-04 ENCOUNTER — Encounter: Admit: 2018-06-04 | Discharge: 2018-06-04

## 2018-06-04 ENCOUNTER — Ambulatory Visit: Admit: 2018-06-04 | Discharge: 2018-06-04 | Payer: MEDICAID

## 2018-06-04 DIAGNOSIS — I10 Essential (primary) hypertension: ICD-10-CM

## 2018-06-04 DIAGNOSIS — I428 Other cardiomyopathies: Principal | ICD-10-CM

## 2018-06-04 DIAGNOSIS — C61 Malignant neoplasm of prostate: ICD-10-CM

## 2018-06-04 DIAGNOSIS — I739 Peripheral vascular disease, unspecified: ICD-10-CM

## 2018-06-04 DIAGNOSIS — J069 Acute upper respiratory infection, unspecified: Secondary | ICD-10-CM

## 2018-06-04 DIAGNOSIS — F17200 Nicotine dependence, unspecified, uncomplicated: ICD-10-CM

## 2018-06-04 DIAGNOSIS — I2510 Atherosclerotic heart disease of native coronary artery without angina pectoris: ICD-10-CM

## 2018-06-04 DIAGNOSIS — J449 Chronic obstructive pulmonary disease, unspecified: ICD-10-CM

## 2018-06-04 DIAGNOSIS — E119 Type 2 diabetes mellitus without complications: ICD-10-CM

## 2018-06-04 DIAGNOSIS — N183 Chronic kidney disease, stage 3 (moderate): ICD-10-CM

## 2018-06-04 DIAGNOSIS — G4733 Obstructive sleep apnea (adult) (pediatric): ICD-10-CM

## 2018-06-04 DIAGNOSIS — E782 Mixed hyperlipidemia: ICD-10-CM

## 2018-06-04 DIAGNOSIS — E785 Hyperlipidemia, unspecified: ICD-10-CM

## 2018-06-04 DIAGNOSIS — Z720 Tobacco use: ICD-10-CM

## 2018-06-04 LAB — BASIC METABOLIC PANEL
Lab: 1.4 mg/dL — ABNORMAL HIGH (ref 0.4–1.24)
Lab: 104 MMOL/L (ref 98–110)
Lab: 14 mg/dL (ref 7–25)
Lab: 141 MMOL/L (ref 137–147)
Lab: 3.7 MMOL/L (ref 3.5–5.1)
Lab: 48 mL/min — ABNORMAL LOW (ref >60)
Lab: 9 (ref 3–12)
Lab: 9.3 mg/dL (ref 8.5–10.6)

## 2018-06-04 LAB — BNP (B-TYPE NATRIURETIC PEPTI): Lab: 125 pg/mL — ABNORMAL HIGH (ref 0–100)

## 2018-06-04 LAB — PROSTATIC SPECIFIC ANTIGEN-PSA: Lab: 0.2 ng/mL — ABNORMAL HIGH (ref <4.01)

## 2018-06-04 NOTE — Progress Notes
C. 12/09/07 Angina, Cath [place]: BareMetalStent 80% mLAD, Kissing fashion to 80% 2nd Dx   D. 06/17/08- dobutamine stress echo:  LV 5.2. EF 60%. No ischemia.   E. 08/25/09- Dobutamine stress echo: Mild concentric LVH. EF 60%. LA is mildly enlarged. No ischemia.  F.  12/01/10 - Dobut   echo:  Terminated w/ HTN, HR<85%   EF - 60%  LVEDD 6.3 cm.  G  5/13 Reg Thall EF 52%, no ischemia    12/08/14: Cath by Dr. Micheline Rough - patent prior stent, no obstructive CAD  02/14/17 [facility] 2018: LAD/2nd Dx stents patent: LCX free of dz, OM has 3 branches, 2 w/ 60% stenosis; RCA 30% proximal followed by 30% mid stenosis. PDA 60% proximal & 50% mid stenosis.  ~ no significant change from cath in 2016.     ??? 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 Normal coronaries.   B. 10/04- Echo: LVIDD 8.1 , EF 25% Valves OK  C. 4/05- Echo: EF 45%. LV 5.5 cm>>> BNP 24. tolerates hydralazine, headaches w/ imdur  D. 2/06, Echo: LV 5.6, EF 50%   E. 2/07- Chest pain, admit [place]: Bare metal stent to 90% mLAD, EF 45%  F. 02/19/07- Dobut echo: LV 6.4, EF 40%, No ischemia, Hypertensive BP response.   G. 12/09/07 Angina, Cath [place]: BareMetalStent for 80% mLAD, Kissing balloon 80% Dx2   H. 2/10 Echo EF 60%  I.  8/11 Dobutamine echo EF 60% no ischemia post stress by EKG or Echo.   J 5/13 Reg Thall EF 52%, no ischemia     ??? Encounter for long-term (current) use of other medications 09/11/2006         Review of Systems   Constitution: Negative.   HENT: Negative.    Eyes: Negative.    Cardiovascular: Positive for dyspnea on exertion.   Respiratory: Positive for shortness of breath.    Endocrine: Negative.    Hematologic/Lymphatic: Negative.    Skin: Negative.    Musculoskeletal: Negative.    Gastrointestinal: Negative.    Genitourinary: Negative.    Neurological: Negative.    Psychiatric/Behavioral: Negative.    Allergic/Immunologic: Negative.

## 2018-06-05 ENCOUNTER — Encounter: Admit: 2018-06-05 | Discharge: 2018-06-05

## 2018-06-05 DIAGNOSIS — Z720 Tobacco use: ICD-10-CM

## 2018-06-05 DIAGNOSIS — I10 Essential (primary) hypertension: Principal | ICD-10-CM

## 2018-06-05 DIAGNOSIS — J449 Chronic obstructive pulmonary disease, unspecified: ICD-10-CM

## 2018-06-05 DIAGNOSIS — C61 Malignant neoplasm of prostate: ICD-10-CM

## 2018-06-05 DIAGNOSIS — E119 Type 2 diabetes mellitus without complications: ICD-10-CM

## 2018-06-05 DIAGNOSIS — I2510 Atherosclerotic heart disease of native coronary artery without angina pectoris: Secondary | ICD-10-CM

## 2018-06-05 DIAGNOSIS — I739 Peripheral vascular disease, unspecified: ICD-10-CM

## 2018-06-05 DIAGNOSIS — G4733 Obstructive sleep apnea (adult) (pediatric): ICD-10-CM

## 2018-06-05 DIAGNOSIS — E785 Hyperlipidemia, unspecified: ICD-10-CM

## 2018-06-05 DIAGNOSIS — N183 Chronic kidney disease, stage 3 (moderate): ICD-10-CM

## 2018-06-05 MED ORDER — (INV) SACUBITRIL/VALSARTAN (HSC 144269) 97/103MG OR PLACEBO ORAL TABLE
1 | ORAL_TABLET | Freq: Two times a day (BID) | ORAL | 11 refills | Status: AC
Start: 2018-06-05 — End: 2018-06-10

## 2018-06-06 ENCOUNTER — Encounter: Admit: 2018-06-06 | Discharge: 2018-06-06

## 2018-06-07 ENCOUNTER — Encounter: Admit: 2018-06-07 | Discharge: 2018-06-07

## 2018-06-10 ENCOUNTER — Encounter: Admit: 2018-06-10 | Discharge: 2018-06-10

## 2018-06-10 MED ORDER — SACUBITRIL-VALSARTAN 97-103 MG PO TAB
1 | ORAL_TABLET | Freq: Two times a day (BID) | ORAL | 3 refills | Status: AC
Start: 2018-06-10 — End: 2019-06-24

## 2018-06-11 ENCOUNTER — Encounter: Admit: 2018-06-11 | Discharge: 2018-06-11

## 2018-06-14 ENCOUNTER — Encounter: Admit: 2018-06-14 | Discharge: 2018-06-14

## 2018-06-27 ENCOUNTER — Encounter: Admit: 2018-06-27 | Discharge: 2018-06-27

## 2018-06-28 ENCOUNTER — Encounter: Admit: 2018-06-28 | Discharge: 2018-06-28

## 2018-06-28 ENCOUNTER — Ambulatory Visit: Admit: 2018-06-28 | Discharge: 2018-06-28 | Payer: MEDICAID

## 2018-06-28 ENCOUNTER — Ambulatory Visit: Admit: 2018-06-28 | Discharge: 2018-06-28

## 2018-06-28 ENCOUNTER — Ambulatory Visit: Admit: 2018-06-28 | Discharge: 2018-06-29

## 2018-06-28 DIAGNOSIS — I2510 Atherosclerotic heart disease of native coronary artery without angina pectoris: Secondary | ICD-10-CM

## 2018-06-28 DIAGNOSIS — J441 Chronic obstructive pulmonary disease with (acute) exacerbation: ICD-10-CM

## 2018-06-28 DIAGNOSIS — I739 Peripheral vascular disease, unspecified: ICD-10-CM

## 2018-06-28 DIAGNOSIS — E119 Type 2 diabetes mellitus without complications: ICD-10-CM

## 2018-06-28 DIAGNOSIS — R062 Wheezing: ICD-10-CM

## 2018-06-28 DIAGNOSIS — E785 Hyperlipidemia, unspecified: ICD-10-CM

## 2018-06-28 DIAGNOSIS — R05 Cough: ICD-10-CM

## 2018-06-28 DIAGNOSIS — I428 Other cardiomyopathies: Principal | ICD-10-CM

## 2018-06-28 DIAGNOSIS — I10 Essential (primary) hypertension: ICD-10-CM

## 2018-06-28 DIAGNOSIS — J449 Chronic obstructive pulmonary disease, unspecified: ICD-10-CM

## 2018-06-28 DIAGNOSIS — N183 Chronic kidney disease, stage 3 (moderate): ICD-10-CM

## 2018-06-28 DIAGNOSIS — C61 Malignant neoplasm of prostate: ICD-10-CM

## 2018-06-28 DIAGNOSIS — G4733 Obstructive sleep apnea (adult) (pediatric): ICD-10-CM

## 2018-06-28 DIAGNOSIS — Z720 Tobacco use: ICD-10-CM

## 2018-06-28 LAB — CBC AND DIFF
Lab: 0.1 10*3/uL (ref 0–0.20)
Lab: 0.5 10*3/uL — ABNORMAL HIGH (ref 0–0.45)
Lab: 0.6 10*3/uL (ref 0–0.80)
Lab: 1 % (ref 0–2)
Lab: 1.2 10*3/uL (ref 1.0–4.8)
Lab: 10 % (ref 4–12)
Lab: 15 % — ABNORMAL HIGH (ref 11–15)
Lab: 16 g/dL — ABNORMAL HIGH (ref 13.5–16.5)
Lab: 21 % — ABNORMAL LOW (ref 24–44)
Lab: 213 10*3/uL (ref 150–400)
Lab: 27 pg — ABNORMAL LOW (ref >60)
Lab: 3.5 10*3/uL (ref 1.8–7.0)
Lab: 32 g/dL — ABNORMAL LOW (ref >60)
Lab: 60 % (ref 41–77)
Lab: 8 % — ABNORMAL HIGH (ref 0–5)
Lab: 8.2 FL (ref 7–11)

## 2018-06-28 LAB — BASIC METABOLIC PANEL
Lab: 141 MMOL/L (ref 137–147)
Lab: 25 MMOL/L (ref 21–30)
Lab: 3.7 MMOL/L (ref 3.5–5.1)

## 2018-06-28 MED ORDER — BENZONATATE 200 MG PO CAP
200 mg | ORAL_CAPSULE | Freq: Three times a day (TID) | ORAL | 0 refills | 9.00000 days | Status: AC | PRN
Start: 2018-06-28 — End: ?

## 2018-06-28 MED ORDER — IPRATROPIUM-ALBUTEROL 0.5 MG-3 MG(2.5 MG BASE)/3 ML IN NEBU
3 mL | Freq: Once | RESPIRATORY_TRACT | 0 refills | Status: CP
Start: 2018-06-28 — End: ?
  Administered 2018-06-28: 18:00:00 3 mL via RESPIRATORY_TRACT

## 2018-06-28 MED ORDER — METHYLPREDNISOLONE ACETATE 80 MG/ML IJ SUSP
80 mg | Freq: Once | INTRAMUSCULAR | 0 refills | Status: CP
Start: 2018-06-28 — End: ?
  Administered 2018-06-28: 18:00:00 80 mg via INTRAMUSCULAR

## 2018-06-28 MED ORDER — DOXYCYCLINE HYCLATE 100 MG PO CAP
100 mg | ORAL_CAPSULE | Freq: Two times a day (BID) | ORAL | 0 refills | 8.00000 days | Status: AC
Start: 2018-06-28 — End: 2019-03-07

## 2018-06-28 NOTE — Progress Notes
Date of Service: 06/28/2018    Sean Henderson is a 63 y.o. male.       HPI    I had the pleasure of seeing Sean Henderson today regarding cough, congestion and increased shortness of breath. He is a very pleasant 63 year old male with a PMH of a nonischemic cardiomyopathy, chronic combined heart failure, CAD with prior LAD stent placement x 2 with BMS in 2007 and 2009, PAD with prior PTA, hypertension, dyslipidemia, obstructive sleep apnea, CKD, renal cysts, prostate cancer and tobacco dependence.  His ejection had declined to 34% on a thallium stress 11/2017 and there was suggestion of limited ischemia.  He subsequently underwent repeat coronary angiography on 01/10/2018, which demonstrated diffuse moderate coronary artery disease.  His previous LAD stents were patent.  LVEDP was normal.  Medical management was recommended.  He saw Dr. Hale Bogus on 2/11 and his blood pressure was 150/90.  His creatinine was stable at 1.47 on labs and Entresto was increased to the high dose??? 97-103 mg twice daily.  He remains on carvedilol 25 mg twice daily, Spironolactone 25 mg daily, hydralazine 50 mg TID and is taking furosemide 20 mg daily.  His blood pressure looks much improved today.  He had URI symptoms when he saw Dr. Hale Bogus last month and reports that he had the flu.  He was feeling better, but then several days ago he developed a productive cough, sinus drainage and sinus congestion.  He reports that his sputum is clear to dark yellow.  He has noted increased shortness of breath.  He notes some chest discomfort with coughing and has some tenderness in his left lateral chest area.  He has not had any leg edema.  He denies lightheadedness, dizziness, syncope, presyncope and palpitations.       Vitals:    06/28/18 0906   BP: 122/84   BP Source: Arm, Left Upper   Pulse: 67   SpO2: 98%   Weight: 113.9 kg (251 lb)   Height: 1.803 m (5' 11)   PainSc: Five     Body mass index is 35.01 kg/m???.     Past Medical History mildly enlarged. No ischemia.  F.  12/01/10 - Dobut   echo:  Terminated w/ HTN, HR<85%   EF - 60%  LVEDD 6.3 cm.  G  5/13 Reg Thall EF 52%, no ischemia    12/08/14: Cath by Dr. Micheline Rough - patent prior stent, no obstructive CAD  02/14/17 [facility] 2018: LAD/2nd Dx stents patent: LCX free of dz, OM has 3 branches, 2 w/ 60% stenosis; RCA 30% proximal followed by 30% mid stenosis. PDA 60% proximal & 50% mid stenosis.  ~ no significant change from cath in 2016.     ??? 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 Normal coronaries.   B. 10/04- Echo: LVIDD 8.1 , EF 25% Valves OK  C. 4/05- Echo: EF 45%. LV 5.5 cm>>> BNP 24. tolerates hydralazine, headaches w/ imdur  D. 2/06, Echo: LV 5.6, EF 50%   E. 2/07- Chest pain, admit : Bare metal stent to 90% mLAD, EF 45%  F. 02/19/07- Dobut echo: LV 6.4, EF 40%, No ischemia, Hypertensive BP response.   G. 12/09/07 Angina, Cath [place]: BareMetalStent for 80% mLAD, Kissing balloon 80% Dx2   H. 2/10 Echo EF 60%  I.  8/11 Dobutamine echo EF 60% no ischemia post stress by EKG  or Echo.   J 5/13 Reg Thall EF 52%, no ischemia     ??? Encounter for long-term (current) use of other medications 09/11/2006     Review of Systems   Constitution: Negative.   HENT: Negative.    Eyes: Negative.    Cardiovascular: Positive for chest pain.        Tightness in chest   Respiratory: Positive for cough.    Endocrine: Negative.    Hematologic/Lymphatic: Negative.    Skin: Negative.    Musculoskeletal: Negative.    Gastrointestinal: Negative.    Genitourinary: Negative.    Neurological: Negative.    Psychiatric/Behavioral: Negative.      Physical Exam  General Appearance: no acute distress, wearing a mask  Skin: warm & intact  HEENT: unremarkable  Neck Veins: neck veins are flat & not distended  Chest Inspection: chest is normal in appearance  Auscultation/Percussion: fine crackles left base Cardiac Rhythm: regular rhythm & normal rate  Cardiac Auscultation: Normal S1 & S2, no S3 or S4, no rub  Murmurs: no cardiac murmurs   Extremities: no lower extremity edema  Abdominal Exam: soft, non-tender, bowel sounds normal  Neurologic Exam: oriented to time, place and person; no focal neurologic deficits  Psychiatric: Normal mood and affect.  Behavior is normal. Judgment and thought content normal.     Cardiovascular Studies  ECG was not repeated today.     Problems Addressed Today  Encounter Diagnoses   Name Primary?   ??? 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 Essential hypertension with goal blood pressure less than 130/80    ??? Coronary artery disease involving native coronary artery of native heart without angina pectoris    ??? CKD (chronic kidney disease) stage 3, GFR 30-59 ml/min (HCC)      Assessment and Plan    In conclusion, Mr. Hettich has a cough, sinus congestion and drainage and increased shortness of breath.  His volume status looks stable.  He has a few fine crackles in his left base.  I am going to send him for a 2 view chest x-ray.  I am going to check a BMP to re-evaluate his creatinine and K+ after his Entresto dose was increased last month.  I would also like to check a BNP and CBC with differential.  We will refer him to urgent care for evaluation.  Since his symptoms have been persistent for longer than 7 to 10 days, he may need antibiotics.    His blood pressure is improved today after increasing the Entresto dose.  He has known underlying coronary artery disease.  He did not have any new obstructive coronary artery disease on coronary angiography 01/2018.  He will continue a daily baby aspirin and atorvastatin 40 mg daily.  His goal LDL is less than 70.  We discussed the importance of complete smoking cessation, again, today.         Current Medications (including today's revisions)  ??? albuterol (VENTOLIN HFA, PROAIR HFA) 90 mcg/actuation inhaler Inhale 1-2

## 2018-06-28 NOTE — Progress Notes
Capillary Refill: Capillary refill takes less than 2 seconds.   Neurological:      General: No focal deficit present.      Mental Status: He is alert and oriented to person, place, and time. Mental status is at baseline.   Psychiatric:         Attention and Perception: Attention and perception normal.         Mood and Affect: Mood and affect normal.         Speech: Speech normal.         Behavior: Behavior normal. Behavior is cooperative.         Thought Content: Thought content normal.         Cognition and Memory: Cognition and memory normal.         Judgment: Judgment normal.              Assessment and Plan:  Sean Henderson was seen today for uri.    Diagnoses and all orders for this visit:    COPD exacerbation (HCC)  -     methylprednisolone acetate (DEPO-Medrol) injection 80 mg  -     doxycycline (VIBRAMYCIN) 100 mg capsule; Take one capsule by mouth twice daily.    Wheezing  -     albuterol-ipratropium (DUO-NEB) nebulizer solution 3 mL  -     doxycycline (VIBRAMYCIN) 100 mg capsule; Take one capsule by mouth twice daily.    Cough  -     albuterol-ipratropium (DUO-NEB) nebulizer solution 3 mL  -     benzonatate (TESSALON) 200 mg capsule; Take one capsule by mouth three times daily as needed for Cough for up to 30 doses.    This patient was given a Duoneb treatment which he tolerated well and had a productive cough with copious amounts of white mucus that he was coughing up. The patients oxygen saturations increased after the treatment. The patient was given a Depo-medrol shot in the clinic and given doxycycline to be taken as directed, and he was educated to take the entire course even if his symptoms improve. He was also given Tesslon for cough to be used PRN as directed. The patient was educated to go to his PCP if his symptoms do not improve, or to the ER if his symptoms worsen. His blood pressure was normal at the clinic this morning so this elevation was probably due to the long walk to the clinic. products.  Preventing a flare-up  Flare-ups happen. But the best way to treat one is to prevent it before it starts. Here are some pointers:  ??? Don???t smoke or be around others who are smoking. Avoid using e-cigarettes due to their harmful side effects.  ??? Take your medicines as discussed with your healthcare provider.  ??? Talk with your provider about getting a flu shot every year. Also find out if you need a pneumonia shot.  ??? If there is a weather advisory warning to stay indoors, try to stay inside when possible.  ??? Try to eat healthy, exercise, and get plenty of sleep.  ??? Try to stay away from things that normally set you off. These include dust, chemical fumes, hairsprays, or strong perfumes.  Follow-up care  Follow up with your healthcare provider, or as advised.  If a culture was done, you will be told if your treatment needs to be changed. You can call as directed???for the results.  If X-rays were done, you will be told of any new findings  that may affect your care.  During each appointment, talk with your healthcare provider about your ability to:  ??? Cope in your normal environment  ??? Correctly use inhaler (or your medicine delivery systems)  ??? Cope with other conditions you have and their treatments and how they may affect your COPD  Call 911  Call 911 if any of these occur:  ??? Wheezing or shortness or breath does not get better with treatment  ??? Chest pain or chest tightness  ??? Feeling lightheaded or dizzy  ??? You have trouble breathing  ??? You feel confused or it???s hard to wake you up  ??? You faint or lose consciousness  ??? You have a rapid heart rate  ??? You have new pain in your chest, arm, shoulder, neck, or upper back  When to seek medical advice  Call your healthcare provider right away???if???any of these occur:  ??? Fever of 100.4???F???(38???C) or higher, or as directed by your healthcare provider  ??? Coughing up lots of dark-colored or bloody mucus (sputum)  ??? You don't start to get better within 24 hours notified of any new findings that may affect your care.  When to seek medical advice  Call your healthcare provider right away if any of these occur:  ??? Mild wheezing or difficulty breathing  ??? Fever of 100.4???F (38???C) or higher, or as directed by your healthcare provider  ??? Unexpected weight loss  ??? Coughing up large amounts of colored sputum or blood-tinged sputum  ??? Night sweats (sheets and pajamas get soaking wet)  Call 911  Call 911 if any of these occur:  ??? Coughing up blood  ??? Moderate to severe trouble breathing or wheezing  [employer] last reviewed this educational content on 09/22/2016  ??? 2000-2019 [employer], [place]. [address] [place], [place] [address]. All rights reserved. This information is not intended as a substitute for professional medical care. Always follow your healthcare professional's instructions.

## 2018-07-23 ENCOUNTER — Encounter: Admit: 2018-07-23 | Discharge: 2018-07-23

## 2018-07-31 ENCOUNTER — Encounter: Admit: 2018-07-31 | Discharge: 2018-07-31

## 2018-08-06 ENCOUNTER — Encounter: Admit: 2018-08-06 | Discharge: 2018-08-06

## 2018-08-07 ENCOUNTER — Encounter: Admit: 2018-08-07 | Discharge: 2018-08-07

## 2018-08-08 ENCOUNTER — Encounter: Admit: 2018-08-08 | Discharge: 2018-08-08

## 2018-08-08 ENCOUNTER — Ambulatory Visit: Admit: 2018-08-08 | Discharge: 2018-08-09 | Payer: MEDICAID

## 2018-08-08 DIAGNOSIS — I739 Peripheral vascular disease, unspecified: ICD-10-CM

## 2018-08-08 DIAGNOSIS — N183 Chronic kidney disease, stage 3 (moderate): ICD-10-CM

## 2018-08-08 DIAGNOSIS — I2510 Atherosclerotic heart disease of native coronary artery without angina pectoris: Secondary | ICD-10-CM

## 2018-08-08 DIAGNOSIS — G4733 Obstructive sleep apnea (adult) (pediatric): ICD-10-CM

## 2018-08-08 DIAGNOSIS — C61 Malignant neoplasm of prostate: ICD-10-CM

## 2018-08-08 DIAGNOSIS — E119 Type 2 diabetes mellitus without complications: ICD-10-CM

## 2018-08-08 DIAGNOSIS — F17200 Nicotine dependence, unspecified, uncomplicated: Secondary | ICD-10-CM

## 2018-08-08 DIAGNOSIS — E785 Hyperlipidemia, unspecified: ICD-10-CM

## 2018-08-08 DIAGNOSIS — I10 Essential (primary) hypertension: Principal | ICD-10-CM

## 2018-08-08 DIAGNOSIS — J449 Chronic obstructive pulmonary disease, unspecified: ICD-10-CM

## 2018-08-08 DIAGNOSIS — Z720 Tobacco use: ICD-10-CM

## 2018-08-08 DIAGNOSIS — R0602 Shortness of breath: Secondary | ICD-10-CM

## 2018-08-08 MED ORDER — HYDRALAZINE 100 MG PO TAB
100 mg | ORAL_TABLET | Freq: Three times a day (TID) | ORAL | 3 refills | 30.00000 days | Status: AC
Start: 2018-08-08 — End: ?

## 2018-08-08 NOTE — Progress Notes
B. 1/05 total 98 trig 397 HDL 30 LDL 54 Lipitor 40>> 10/06- start Vytorin 10-20  C. 6/07- total 104, trig 91, HDL 27, LDL 67- Vytorin 10-20  E. 27/0/62- total 160, trig 89, HDL 21, LDL 118 Myalgias:DC vytorin, Rx Crestor 10, niaspan 1000  G. 03/24/09- total 102, trig 154, HDL 25, LDL 60- simvastatin 40, niaspan 1gm  I. 09/12/10- total 106, trig 79,HDL 29, LDL 64- simvastatin 20, niaspan 1gm     ??? CAD (coronary artery disease), native coronary artery 12/09/2007     A.2/04 Normal coronaries TMC EF 15%   B. 2/07- Chest pain, admit [place]: Bare metal stent to 90% mLAD, EF 45%  C. 12/09/07 Angina, Cath [place]: BareMetalStent 80% mLAD, Kissing fashion to 80% 2nd Dx   D. 06/17/08- dobutamine stress echo:  LV 5.2. EF 60%. No ischemia.   E. 08/25/09- Dobutamine stress echo: Mild concentric LVH. EF 60%. LA is mildly enlarged. No ischemia.  F.  12/01/10 - Dobut   echo:  Terminated w/ HTN, HR<85%   EF - 60%  LVEDD 6.3 cm.  G  5/13 Reg [place]all EF 52%, no ischemia    12/08/14: Cath by Dr. Micheline Rough - patent prior stent, no obstructive CAD  02/14/17 [facility] 2018: LAD/2nd Dx stents patent: LCX free of dz, OM has 3 branches, 2 w/ 60% stenosis; RCA 30% proximal followed by 30% mid stenosis. PDA 60% proximal & 50% mid stenosis.  ~ no significant change from cath in 2016.     ??? 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 Normal coronaries.   B. 10/04- Echo: LVIDD 8.1 , EF 25% Valves OK  C. 4/05- Echo: EF 45%. LV 5.5 cm>>> BNP 24. tolerates hydralazine, headaches w/ imdur  D. 2/06, Echo: LV 5.6, EF 50%   E. 2/07- Chest pain, admit [place]: Bare metal stent to 90% mLAD, EF 45%  F. 02/19/07- Dobut echo: LV 6.4, EF 40%, No ischemia, Hypertensive BP response.   G. 12/09/07 Angina, Cath [place]: BareMetalStent for 80% mLAD, Kissing balloon 80% Dx2   H. 2/10 Echo EF 60%  I.  8/11 Dobutamine echo EF 60% no ischemia post stress by EKG or Echo. ??? nitroglycerin (NITROSTAT) 0.4 mg tablet Place one tab under tongue every 5 mins not to exceed 3 tabs;as needed for chest pain. If chest pain persists go to the ER or call 911.   ??? sacubitril-valsartan (ENTRESTO) 97-103 mg tablet Take one tablet by mouth twice daily.   ??? spironolactone (ALDACTONE) 25 mg tablet Take 25 mg by mouth daily. Take with food.   ??? traMADol (ULTRAM) 50 mg tablet TK 1 T PO QID PRN FOR PAIN

## 2018-08-09 DIAGNOSIS — I428 Other cardiomyopathies: ICD-10-CM

## 2018-08-09 DIAGNOSIS — I2510 Atherosclerotic heart disease of native coronary artery without angina pectoris: Secondary | ICD-10-CM

## 2018-08-09 DIAGNOSIS — E782 Mixed hyperlipidemia: Secondary | ICD-10-CM

## 2018-08-09 DIAGNOSIS — I10 Essential (primary) hypertension: ICD-10-CM

## 2018-10-21 ENCOUNTER — Encounter: Admit: 2018-10-21 | Discharge: 2018-10-21

## 2018-10-28 ENCOUNTER — Encounter: Admit: 2018-10-28 | Discharge: 2018-10-28

## 2019-03-07 ENCOUNTER — Encounter: Admit: 2019-03-07 | Discharge: 2019-03-07

## 2019-03-07 ENCOUNTER — Ambulatory Visit: Admit: 2019-03-07 | Discharge: 2019-03-07 | Payer: MEDICAID

## 2019-03-07 ENCOUNTER — Ambulatory Visit: Admit: 2019-03-07 | Discharge: 2019-03-07

## 2019-03-07 DIAGNOSIS — I428 Other cardiomyopathies: Secondary | ICD-10-CM

## 2019-03-07 DIAGNOSIS — E782 Mixed hyperlipidemia: Secondary | ICD-10-CM

## 2019-03-07 DIAGNOSIS — I10 Essential (primary) hypertension: Secondary | ICD-10-CM

## 2019-03-07 DIAGNOSIS — J449 Chronic obstructive pulmonary disease, unspecified: Secondary | ICD-10-CM

## 2019-03-07 DIAGNOSIS — I2510 Atherosclerotic heart disease of native coronary artery without angina pectoris: Secondary | ICD-10-CM

## 2019-03-07 DIAGNOSIS — G4733 Obstructive sleep apnea (adult) (pediatric): Secondary | ICD-10-CM

## 2019-03-07 DIAGNOSIS — F17200 Nicotine dependence, unspecified, uncomplicated: Secondary | ICD-10-CM

## 2019-03-07 LAB — LIPID PROFILE
Lab: 124 mg/dL (ref <150)
Lab: 166 mg/dL — ABNORMAL HIGH (ref <200)
Lab: 25 mg/dL (ref 24–44)
Lab: 25 mg/dL — ABNORMAL LOW (ref >40)

## 2019-03-07 LAB — COMPREHENSIVE METABOLIC PANEL
Lab: 0.5 mg/dL (ref 0.3–1.2)
Lab: 1.6 mg/dL — ABNORMAL HIGH (ref 0.4–1.24)
Lab: 10 U/L (ref 7–56)
Lab: 108 MMOL/L (ref 98–110)
Lab: 143 MMOL/L (ref 137–147)
Lab: 15 U/L (ref 7–40)
Lab: 21 mg/dL (ref 7–25)
Lab: 25 MMOL/L (ref 21–30)
Lab: 3.6 MMOL/L — ABNORMAL HIGH (ref 3.5–5.1)
Lab: 4.3 g/dL (ref 3.5–5.0)
Lab: 43 mL/min — ABNORMAL LOW (ref >60)
Lab: 52 mL/min — ABNORMAL LOW (ref >60)
Lab: 7.2 g/dL (ref 6.0–8.0)
Lab: 80 mg/dL (ref 70–100)
Lab: 88 U/L (ref 25–110)
Lab: 9.5 mg/dL — ABNORMAL HIGH (ref 8.5–10.6)
Lab: 10 (ref 3–12)

## 2019-03-07 LAB — BNP (B-TYPE NATRIURETIC PEPTI): Lab: 201 pg/mL — ABNORMAL HIGH (ref 0–100)

## 2019-03-07 LAB — CBC AND DIFF
Lab: 27 pg — ABNORMAL LOW (ref 26–34)
Lab: 32 g/dL — ABNORMAL HIGH (ref 32.0–36.0)
Lab: 5.9 M/UL — ABNORMAL HIGH (ref 4.4–5.5)
Lab: 50 % — ABNORMAL HIGH (ref 40–50)
Lab: 6.3 10*3/uL — ABNORMAL LOW (ref 4.5–11.0)

## 2019-03-07 LAB — CREATINE KINASE-CPK: Lab: 94 U/L — ABNORMAL HIGH (ref 35–232)

## 2019-03-07 LAB — TSH WITH FREE T4 REFLEX: Lab: 0.8 uU/mL — ABNORMAL HIGH (ref <100)

## 2019-03-07 MED ORDER — ISOSORBIDE MONONITRATE 30 MG PO TB24
30 mg | ORAL_TABLET | Freq: Every morning | ORAL | 3 refills | 30.00000 days | Status: DC
Start: 2019-03-07 — End: 2019-07-07

## 2019-03-07 MED ORDER — NITROGLYCERIN 0.4 MG SL SUBL
ORAL_TABLET | SUBLINGUAL | 3 refills | 9.00000 days | Status: AC | PRN
Start: 2019-03-07 — End: ?

## 2019-03-07 MED ORDER — SPIRONOLACTONE 25 MG PO TAB
25 mg | ORAL_TABLET | Freq: Every day | ORAL | 3 refills | 90.00000 days | Status: DC
Start: 2019-03-07 — End: 2019-08-18

## 2019-03-07 MED ORDER — ALBUTEROL SULFATE 90 MCG/ACTUATION IN HFAA
1-2 | RESPIRATORY_TRACT | 3 refills | Status: DC | PRN
Start: 2019-03-07 — End: 2019-10-01

## 2019-03-07 NOTE — Patient Instructions
-   It was nice to see you today!    - Let's check some labs today and I'd like to repeat an echocardiogram.     - Please resume spironolactone 25 mg daily. I sent in a new prescription. I think it will help with the congestion you are noting.     - I'd like you to see one of the pulmonologists regarding your ongoing cough. I also entered a referral to dermatology regarding the nonhealing sore on your right ankle.     - I am going to start you on Imdur (isosorbide mononitrate) 30 mg daily. Take it first thing in the morning. It is an anti-anginal medication. It is a nitroglycerin pill. A common side effect is headache.     - I have requested a 6 week follow up visit. Please send [place] a MyChart message or call our nurse line at [phone] with any questions or concerns in the meantime.

## 2019-03-10 ENCOUNTER — Encounter: Admit: 2019-03-10 | Discharge: 2019-03-10

## 2019-03-10 DIAGNOSIS — J449 Chronic obstructive pulmonary disease, unspecified: Secondary | ICD-10-CM

## 2019-03-10 DIAGNOSIS — I2510 Atherosclerotic heart disease of native coronary artery without angina pectoris: Secondary | ICD-10-CM

## 2019-03-10 DIAGNOSIS — Z720 Tobacco use: Secondary | ICD-10-CM

## 2019-03-10 DIAGNOSIS — G4733 Obstructive sleep apnea (adult) (pediatric): Secondary | ICD-10-CM

## 2019-03-10 DIAGNOSIS — E785 Hyperlipidemia, unspecified: Secondary | ICD-10-CM

## 2019-03-10 DIAGNOSIS — I10 Essential (primary) hypertension: Secondary | ICD-10-CM

## 2019-03-10 DIAGNOSIS — I739 Peripheral vascular disease, unspecified: Secondary | ICD-10-CM

## 2019-03-10 DIAGNOSIS — N183 CKD (chronic kidney disease) stage 3, GFR 30-59 ml/min: Secondary | ICD-10-CM

## 2019-03-10 DIAGNOSIS — E119 Type 2 diabetes mellitus without complications: Secondary | ICD-10-CM

## 2019-03-10 DIAGNOSIS — C61 Malignant neoplasm of prostate: Secondary | ICD-10-CM

## 2019-03-11 ENCOUNTER — Encounter: Admit: 2019-03-11 | Discharge: 2019-03-11

## 2019-03-11 DIAGNOSIS — R05 Cough: Secondary | ICD-10-CM

## 2019-03-12 ENCOUNTER — Encounter: Admit: 2019-03-12 | Discharge: 2019-03-12

## 2019-03-12 ENCOUNTER — Ambulatory Visit: Admit: 2019-03-12 | Discharge: 2019-03-12 | Payer: MEDICAID

## 2019-03-12 ENCOUNTER — Ambulatory Visit: Admit: 2019-03-12 | Discharge: 2019-03-12

## 2019-03-12 DIAGNOSIS — R05 Cough: Secondary | ICD-10-CM

## 2019-03-12 DIAGNOSIS — J449 Chronic obstructive pulmonary disease, unspecified: Secondary | ICD-10-CM

## 2019-03-12 DIAGNOSIS — I739 Peripheral vascular disease, unspecified: Secondary | ICD-10-CM

## 2019-03-12 DIAGNOSIS — Z720 Tobacco use: Secondary | ICD-10-CM

## 2019-03-12 DIAGNOSIS — N183 CKD (chronic kidney disease) stage 3, GFR 30-59 ml/min: Secondary | ICD-10-CM

## 2019-03-12 DIAGNOSIS — F17200 Nicotine dependence, unspecified, uncomplicated: Secondary | ICD-10-CM

## 2019-03-12 DIAGNOSIS — R0602 Shortness of breath: Secondary | ICD-10-CM

## 2019-03-12 DIAGNOSIS — G4733 Obstructive sleep apnea (adult) (pediatric): Secondary | ICD-10-CM

## 2019-03-12 DIAGNOSIS — C61 Malignant neoplasm of prostate: Secondary | ICD-10-CM

## 2019-03-12 DIAGNOSIS — I10 Essential (primary) hypertension: Secondary | ICD-10-CM

## 2019-03-12 DIAGNOSIS — E785 Hyperlipidemia, unspecified: Secondary | ICD-10-CM

## 2019-03-12 DIAGNOSIS — R911 Solitary pulmonary nodule: Secondary | ICD-10-CM

## 2019-03-12 DIAGNOSIS — E119 Type 2 diabetes mellitus without complications: Secondary | ICD-10-CM

## 2019-03-12 DIAGNOSIS — I2510 Atherosclerotic heart disease of native coronary artery without angina pectoris: Secondary | ICD-10-CM

## 2019-03-12 MED ORDER — BREZTRI AEROSPHERE 160-9-4.8 MCG/ACTUATION IN HFAA
2 | Freq: Two times a day (BID) | RESPIRATORY_TRACT | 3 refills | Status: DC
Start: 2019-03-12 — End: 2019-10-01

## 2019-03-12 MED ORDER — ALBUTEROL SULFATE 90 MCG/ACTUATION IN HFAA
2 | RESPIRATORY_TRACT | 11 refills | Status: DC | PRN
Start: 2019-03-12 — End: 2019-04-07

## 2019-03-23 ENCOUNTER — Encounter: Admit: 2019-03-23 | Discharge: 2019-03-23

## 2019-04-04 ENCOUNTER — Encounter: Admit: 2019-04-04 | Discharge: 2019-04-04

## 2019-04-04 NOTE — Telephone Encounter
PA received from patient's pharmacy for Twin 160-9-4.81mcg/act aerosol.     PA completed and submitted to [place] [employer]: [employer] via [email or web address]. Awaiting determination.     KEY: [id number]    Routing to Estanislado Spire, RN  [place], [place]

## 2019-04-07 ENCOUNTER — Encounter: Admit: 2019-04-07 | Discharge: 2019-04-07

## 2019-04-07 ENCOUNTER — Ambulatory Visit: Admit: 2019-04-07 | Discharge: 2019-04-07

## 2019-04-07 ENCOUNTER — Ambulatory Visit: Admit: 2019-04-07 | Discharge: 2019-04-07 | Payer: MEDICAID

## 2019-04-07 DIAGNOSIS — E1151 Type 2 diabetes mellitus with diabetic peripheral angiopathy without gangrene: Secondary | ICD-10-CM

## 2019-04-07 DIAGNOSIS — I2510 Atherosclerotic heart disease of native coronary artery without angina pectoris: Secondary | ICD-10-CM

## 2019-04-07 DIAGNOSIS — J449 Chronic obstructive pulmonary disease, unspecified: Secondary | ICD-10-CM

## 2019-04-07 DIAGNOSIS — E785 Hyperlipidemia, unspecified: Secondary | ICD-10-CM

## 2019-04-07 DIAGNOSIS — Z720 Tobacco use: Secondary | ICD-10-CM

## 2019-04-07 DIAGNOSIS — E119 Type 2 diabetes mellitus without complications: Secondary | ICD-10-CM

## 2019-04-07 DIAGNOSIS — I739 Peripheral vascular disease, unspecified: Secondary | ICD-10-CM

## 2019-04-07 DIAGNOSIS — I209 Angina pectoris, unspecified: Secondary | ICD-10-CM

## 2019-04-07 DIAGNOSIS — I10 Essential (primary) hypertension: Secondary | ICD-10-CM

## 2019-04-07 DIAGNOSIS — G4733 Obstructive sleep apnea (adult) (pediatric): Secondary | ICD-10-CM

## 2019-04-07 DIAGNOSIS — N183 CKD (chronic kidney disease) stage 3, GFR 30-59 ml/min: Secondary | ICD-10-CM

## 2019-04-07 DIAGNOSIS — C61 Malignant neoplasm of prostate: Secondary | ICD-10-CM

## 2019-04-07 MED ORDER — AMLODIPINE 5 MG PO TAB
5 mg | ORAL_TABLET | Freq: Every day | ORAL | 3 refills | Status: AC
Start: 2019-04-07 — End: ?

## 2019-04-07 MED ORDER — EZETIMIBE 10 MG PO TAB
10 mg | ORAL_TABLET | Freq: Every day | ORAL | 3 refills | Status: DC
Start: 2019-04-07 — End: 2019-04-10

## 2019-04-10 ENCOUNTER — Encounter: Admit: 2019-04-10 | Discharge: 2019-04-10

## 2019-04-10 MED ORDER — ATORVASTATIN 40 MG PO TAB
40 mg | ORAL_TABLET | Freq: Every day | ORAL | 3 refills | Status: AC
Start: 2019-04-10 — End: ?

## 2019-04-10 MED ORDER — EZETIMIBE 10 MG PO TAB
10 mg | ORAL_TABLET | Freq: Every day | ORAL | 3 refills | Status: AC
Start: 2019-04-10 — End: ?

## 2019-05-28 ENCOUNTER — Encounter: Admit: 2019-05-28 | Discharge: 2019-05-28

## 2019-05-28 ENCOUNTER — Ambulatory Visit: Admit: 2019-05-28 | Discharge: 2019-05-29 | Payer: MEDICAID

## 2019-05-28 DIAGNOSIS — Z720 Tobacco use: Secondary | ICD-10-CM

## 2019-05-28 DIAGNOSIS — I2510 Atherosclerotic heart disease of native coronary artery without angina pectoris: Secondary | ICD-10-CM

## 2019-05-28 DIAGNOSIS — F17200 Nicotine dependence, unspecified, uncomplicated: Secondary | ICD-10-CM

## 2019-05-28 DIAGNOSIS — R0602 Shortness of breath: Secondary | ICD-10-CM

## 2019-05-28 DIAGNOSIS — N183 CKD (chronic kidney disease) stage 3, GFR 30-59 ml/min (HCC): Secondary | ICD-10-CM

## 2019-05-28 DIAGNOSIS — I739 Peripheral vascular disease, unspecified: Secondary | ICD-10-CM

## 2019-05-28 DIAGNOSIS — I10 Essential (primary) hypertension: Secondary | ICD-10-CM

## 2019-05-28 DIAGNOSIS — C61 Malignant neoplasm of prostate: Secondary | ICD-10-CM

## 2019-05-28 DIAGNOSIS — G4733 Obstructive sleep apnea (adult) (pediatric): Secondary | ICD-10-CM

## 2019-05-28 DIAGNOSIS — E119 Type 2 diabetes mellitus without complications: Secondary | ICD-10-CM

## 2019-05-28 DIAGNOSIS — J449 Chronic obstructive pulmonary disease, unspecified: Secondary | ICD-10-CM

## 2019-05-28 DIAGNOSIS — E785 Hyperlipidemia, unspecified: Secondary | ICD-10-CM

## 2019-05-28 MED ORDER — NICOTINE 10 MG IN CRTG
1 | RESPIRATORY_TRACT | 1 refills | Status: DC | PRN
Start: 2019-05-28 — End: 2019-10-01

## 2019-05-28 NOTE — Patient Instructions
Clinic Visit Summary:     It was a pleasure seeing you in clinic today.     We are glad things are improving. We have several things to help you continue to get better:  - we are sending a referral for pulmonary rehab. They will call you to schedule this  - we will order Nicotrol inhaler to help get you off cigarettes   - we will schedule you for CT scans of your chest starting in December  - we recommend discussing your concerns with your BiPAP machine with your sleep providers at [facility]   - continue your Breztri inhaler and albuterol as needed      Please contact my nurse Estanislado Spire RN with signs and symptoms of worsening condition:  Increased shortness of breath  Increased coughing  New or increased cough with thick or abnormal sputum  Bloody sputum  Chest Tightness  Chest Pain  Wheezing  Fevers, chills, or new night sweats    Or with any other questions or concerns.    Pulmonary Clinic Nurse Coordinator- Estanislado Spire, RN  Office) [phone]  Fax) [phone]    For URGENT issues after business hours, weekends, or holidays, please call [phone] and request for the Pulmonary Fellow to be paged    For refills on medications: Ask for your pharmacy to send an electronic refill request. Please allow at least 3 business days for refill requests.    For equipment issues, please contact your [place] directly.     To cancel, change, or schedule a clinic appointment, call our Pulmonary Schedulers at [phone].

## 2019-05-28 NOTE — Progress Notes
Pulmonary Clinic Progress Note    Name: Sean Henderson   Date: 05/28/19  Routine follow-up of COPD  History:   Sean Henderson is 64 y.o. male who presents to clinic today for COPD and OSA.    Patient's pulmonary medical history is significant for the following:   Sean Henderson was initially referred to me on 03/12/2019 for new patient evaluation of chronic cough and shortness of breath.  He had a established diagnosis of COPD with severe obstruction and had been intermittently adherent with his inhaler therapies.  At the time he had progressive symptoms and difficulty managing multiple inhalers.  We simplified his regimen to [facility] and as needed albuterol.  We obtained a CT chest for pulmonary nodule follow-up which showed resolution of the previously identified pulmonary nodule.    Since then, patient states he is doing so-so.  He does feel like he gets overexerted whenever he is walking long distances or carrying heavy objects.  He has some productive cough, particularly when he is in the hot shower.  He does not have substantial productive cough throughout the course of the day.  He does not have shortness of breath outside of exertion.  He has had no hospitalizations, ER visits, urgent care visits, or outpatient exacerbations since her last visit.    He has been using the Breztri samples we gave him which he thinks is much better than symbicort.  We are still awaiting insurance authorization for outpatient use.    Has not been using his PAP therapy much because he feels sick when he uses it. Follows with sleep physicians at [facility] center.     He continues to actively smoke half a pack per day.  This is down from his maximum use.  He feels like he is improving with use of nicotine patches.  He is open to additional smoking cessation modalities.    Review of Systems [facility] denotes positive, otherwise negative)  Gen: fatigue, fevers, chills, drenching night sweats, unintentional weight change HEENT: changes in hearing, changes in vision, rhinorrhea, sinus congestion   CV: Chest pain, palpations, orthopnea, PND, edema  Respiratory: see HPI  GI: nausea, vomiting, constipation, diarrhea  GU: changes in urinary frequency, changes in urine quality  Msk: muscle aches/pains, joint pains  Lymphatics: lymphadenopathy  Skin: rashes, bruises, bleeding  Neuro: weakness, headaches, numbness, tingling  Psych: anxiety, depression     Past medical, surgical, social and family histories have been reviewed in Epic and updated as appropriate.     Objective   Labs reviewed in Epic. Notable for as follows:  Absolute eosinophils 670 (03/07/19), 500 (06/28/18)    Imaging reviewed in Epic. Notable for as follows:   CT chest w/o 04/07/19    1. ?Resolution of small groundglass opacity in the anterior right lung   apex. No new or enlarging pulmonary nodule.   2. ?Mild emphysema with mild diffuse bronchial wall thickening, which may   reflect bronchitis.   3. ?Mild cardiomegaly and trace bilateral pleural effusions.     PFTs:  03/12/2019  FVC      2.07      52%  FEV1    1.50      49%  FEV1/FVC         0.72  FEV1/SVC         0.62  RV        2.61      124%  TLC       4.88  76%  DLCO                82%  - consistent with severe obstructive defect with mild air trapping ano no response to bronchodilators.     Exam:  Vitals:    05/28/19 1321   BP: 137/71   Pulse: 74   Resp: 18   SpO2: 97%     Gen: well appearing man, NAD  HEENT: PERRL, sclera anicteric  CV: RRR no m/r/g  Pulm: CTAB, no wheezing or crackles. No increased work of breathing  GI: NTND  Ext: no c/c/e  Neuro: Cns grossly intact, no focal neurologic defects  Psych: awake, alert and appropriately interactive    Assessment and Recommendations: Sean Henderson is a 64 y.o. male who presents to clinic today for routine follow-up of COPD, OSA, history of pulmonary nodule.  Overall his COPD is under good control.  We are achieving our goal of no exacerbations and minimal rescue inhaler use.  He does like Breztri, and we will continue prior authorization work-up for this.    We will send referral for pulmonary rehab.  He does not require oxygen therapy at this time.  I defer to his outpatient sleep physician for management of his nocturnal PAP therapy.  He is up-to-date on his immunizations.  He is looking forward to getting his Covid vaccine whenever it becomes available to him.    Problems:  # COPD with severe obstructive defect  # Obstructive sleep apnea on PAP  # Tobacco use  # pulmonary nodule - resolved    Plan:   - continue Breztri. Will provide additional sample today while we await prior auth.  - continue albuterol prn  - enroll in LDCT screening starting in December of this year  - referral for pulmonary rehab  - encouraged improved compliance with PAP therapy. Recommend patient reach out to his sleep physician at [facility]   - encouraged tobacco cessation. Continue NRT patches. Will also add on Nicotrol inhaler.     Total time spent on this encounter including chart review, was 42 minutes. Time included:  ? review of medical records, laboratory findings, and personal review of images  ? obtaining and-/or reviewing separately obtained history  ? performing a medically appropriate examination and/or evaluation  ? counseling and educating the patient/family/caregiver  ? ordering medications, tests, or procedures  ? referring and communicating with other health care professionals (when not separately reported)  ? documenting clinical information in the electronic or other health record  ? independently interpreting results (not separately reported) and communicating results to the  patient/family/caregiver  ? care coordination      Lynwood Dawley, MD Pulmonary and Critical Care Fellow  05/28/19  12:55 PM

## 2019-05-29 DIAGNOSIS — J449 Chronic obstructive pulmonary disease, unspecified: Secondary | ICD-10-CM

## 2019-06-03 ENCOUNTER — Encounter: Admit: 2019-06-03 | Discharge: 2019-06-03

## 2019-06-03 NOTE — Telephone Encounter
Contacted patient regarding outpatient pulmonary rehab. Informed him that our benefits department stated that he is technically out of network for pulmonary rehab here at [facility]. He is interested in having his referral sent to [facility]. Will send referral there today.

## 2019-06-24 ENCOUNTER — Encounter: Admit: 2019-06-24 | Discharge: 2019-06-24

## 2019-06-24 MED ORDER — ENTRESTO 97-103 MG PO TAB
ORAL_TABLET | Freq: Two times a day (BID) | 11 refills | Status: AC
Start: 2019-06-24 — End: ?

## 2019-07-06 ENCOUNTER — Encounter: Admit: 2019-07-06 | Discharge: 2019-07-06

## 2019-07-07 ENCOUNTER — Ambulatory Visit: Admit: 2019-07-07 | Discharge: 2019-07-07 | Payer: MEDICAID

## 2019-07-07 ENCOUNTER — Encounter: Admit: 2019-07-07 | Discharge: 2019-07-07

## 2019-07-07 DIAGNOSIS — G4733 Obstructive sleep apnea (adult) (pediatric): Secondary | ICD-10-CM

## 2019-07-07 DIAGNOSIS — F17200 Nicotine dependence, unspecified, uncomplicated: Secondary | ICD-10-CM

## 2019-07-07 DIAGNOSIS — J449 Chronic obstructive pulmonary disease, unspecified: Secondary | ICD-10-CM

## 2019-07-07 DIAGNOSIS — E782 Mixed hyperlipidemia: Secondary | ICD-10-CM

## 2019-07-07 DIAGNOSIS — I428 Other cardiomyopathies: Secondary | ICD-10-CM

## 2019-07-07 DIAGNOSIS — I10 Essential (primary) hypertension: Secondary | ICD-10-CM

## 2019-07-07 DIAGNOSIS — I2510 Atherosclerotic heart disease of native coronary artery without angina pectoris: Secondary | ICD-10-CM

## 2019-07-07 MED ORDER — PERFLUTREN LIPID MICROSPHERES 1.1 MG/ML IV SUSP
1-20 mL | Freq: Once | INTRAVENOUS | 0 refills | Status: CP | PRN
Start: 2019-07-07 — End: ?
  Administered 2019-07-07: 16:00:00 5 mL via INTRAVENOUS

## 2019-07-07 MED ORDER — ISOSORBIDE MONONITRATE 30 MG PO TB24
ORAL_TABLET | Freq: Every morning | ORAL | 11 refills | 30.00000 days | Status: AC
Start: 2019-07-07 — End: ?

## 2019-07-17 ENCOUNTER — Encounter: Admit: 2019-07-17 | Discharge: 2019-07-17

## 2019-08-18 MED ORDER — SPIRONOLACTONE 50 MG PO TAB
50 mg | ORAL_TABLET | Freq: Every day | ORAL | 3 refills | 90.00000 days | Status: AC
Start: 2019-08-18 — End: ?

## 2019-10-01 ENCOUNTER — Encounter: Admit: 2019-10-01 | Discharge: 2019-10-01

## 2019-10-01 ENCOUNTER — Ambulatory Visit: Admit: 2019-10-01 | Discharge: 2019-10-02 | Payer: MEDICAID

## 2019-10-01 DIAGNOSIS — E119 Type 2 diabetes mellitus without complications: Secondary | ICD-10-CM

## 2019-10-01 DIAGNOSIS — J449 Chronic obstructive pulmonary disease, unspecified: Secondary | ICD-10-CM

## 2019-10-01 DIAGNOSIS — Z720 Tobacco use: Secondary | ICD-10-CM

## 2019-10-01 DIAGNOSIS — N183 CKD (chronic kidney disease) stage 3, GFR 30-59 ml/min (HCC): Secondary | ICD-10-CM

## 2019-10-01 DIAGNOSIS — G4733 Obstructive sleep apnea (adult) (pediatric): Secondary | ICD-10-CM

## 2019-10-01 DIAGNOSIS — I739 Peripheral vascular disease, unspecified: Secondary | ICD-10-CM

## 2019-10-01 DIAGNOSIS — F17200 Nicotine dependence, unspecified, uncomplicated: Secondary | ICD-10-CM

## 2019-10-01 DIAGNOSIS — I2510 Atherosclerotic heart disease of native coronary artery without angina pectoris: Secondary | ICD-10-CM

## 2019-10-01 DIAGNOSIS — E785 Hyperlipidemia, unspecified: Secondary | ICD-10-CM

## 2019-10-01 DIAGNOSIS — I10 Essential (primary) hypertension: Secondary | ICD-10-CM

## 2019-10-01 DIAGNOSIS — C61 Malignant neoplasm of prostate: Secondary | ICD-10-CM

## 2019-10-01 MED ORDER — BUDESONIDE-FORMOTEROL 160-4.5 MCG/ACTUATION IN HFAA
2 | Freq: Two times a day (BID) | RESPIRATORY_TRACT | 11 refills | 30.00000 days | Status: AC
Start: 2019-10-01 — End: ?

## 2019-10-01 MED ORDER — SPIRIVA RESPIMAT 2.5 MCG/ACTUATION IN MIST
2 | Freq: Every day | RESPIRATORY_TRACT | 11 refills | Status: AC
Start: 2019-10-01 — End: ?

## 2019-10-01 MED ORDER — NICOTINE 10 MG IN CRTG
1 | RESPIRATORY_TRACT | 1 refills | Status: AC | PRN
Start: 2019-10-01 — End: ?

## 2019-10-01 MED ORDER — ALBUTEROL SULFATE 90 MCG/ACTUATION IN HFAA
1-2 | RESPIRATORY_TRACT | 3 refills | Status: AC | PRN
Start: 2019-10-01 — End: ?

## 2019-10-01 MED ORDER — CHANTIX STARTING MONTH BOX 0.5 MG (11)- 1 MG (42) PO DSPK
ORAL_TABLET | Freq: Every day | 0 refills | Status: AC
Start: 2019-10-01 — End: ?

## 2019-10-04 ENCOUNTER — Encounter: Admit: 2019-10-04 | Discharge: 2019-10-04

## 2019-10-04 DIAGNOSIS — I2510 Atherosclerotic heart disease of native coronary artery without angina pectoris: Secondary | ICD-10-CM

## 2019-10-04 DIAGNOSIS — G4733 Obstructive sleep apnea (adult) (pediatric): Secondary | ICD-10-CM

## 2019-10-04 DIAGNOSIS — C61 Malignant neoplasm of prostate: Secondary | ICD-10-CM

## 2019-10-04 DIAGNOSIS — Z720 Tobacco use: Secondary | ICD-10-CM

## 2019-10-04 DIAGNOSIS — I10 Essential (primary) hypertension: Secondary | ICD-10-CM

## 2019-10-04 DIAGNOSIS — N183 CKD (chronic kidney disease) stage 3, GFR 30-59 ml/min (HCC): Secondary | ICD-10-CM

## 2019-10-04 DIAGNOSIS — E785 Hyperlipidemia, unspecified: Secondary | ICD-10-CM

## 2019-10-04 DIAGNOSIS — I739 Peripheral vascular disease, unspecified: Secondary | ICD-10-CM

## 2019-10-04 DIAGNOSIS — J449 Chronic obstructive pulmonary disease, unspecified: Secondary | ICD-10-CM

## 2019-10-04 DIAGNOSIS — E119 Type 2 diabetes mellitus without complications: Secondary | ICD-10-CM

## 2019-10-17 MED ORDER — FLUTICASONE PROPION-SALMETEROL 250-50 MCG/DOSE IN DSDV
1 | Freq: Two times a day (BID) | RESPIRATORY_TRACT | 0 refills
Start: 2019-10-17 — End: ?

## 2019-10-29 ENCOUNTER — Encounter: Admit: 2019-10-29 | Discharge: 2019-10-29

## 2019-12-22 ENCOUNTER — Ambulatory Visit: Admit: 2019-12-22 | Discharge: 2019-12-23 | Payer: MEDICAID

## 2019-12-22 ENCOUNTER — Encounter: Admit: 2019-12-22 | Discharge: 2019-12-22

## 2019-12-22 DIAGNOSIS — N183 CKD (chronic kidney disease) stage 3, GFR 30-59 ml/min (HCC): Secondary | ICD-10-CM

## 2019-12-22 DIAGNOSIS — I739 Peripheral vascular disease, unspecified: Secondary | ICD-10-CM

## 2019-12-22 DIAGNOSIS — I2510 Atherosclerotic heart disease of native coronary artery without angina pectoris: Secondary | ICD-10-CM

## 2019-12-22 DIAGNOSIS — J449 Chronic obstructive pulmonary disease, unspecified: Secondary | ICD-10-CM

## 2019-12-22 DIAGNOSIS — I10 Essential (primary) hypertension: Secondary | ICD-10-CM

## 2019-12-22 DIAGNOSIS — G4733 Obstructive sleep apnea (adult) (pediatric): Secondary | ICD-10-CM

## 2019-12-22 DIAGNOSIS — C61 Malignant neoplasm of prostate: Secondary | ICD-10-CM

## 2019-12-22 DIAGNOSIS — E119 Type 2 diabetes mellitus without complications: Secondary | ICD-10-CM

## 2019-12-22 DIAGNOSIS — Z720 Tobacco use: Secondary | ICD-10-CM

## 2019-12-22 DIAGNOSIS — E785 Hyperlipidemia, unspecified: Secondary | ICD-10-CM

## 2019-12-22 NOTE — Patient Instructions
Mr. Basnett your heart failure meds are state-of-the-art.  You do not have findings of heart failure and if that were the only issue you are in great shape.  However coronary disease raises a couple of questions first of all you have the chest tightness with exertion that is difficult to fully assess when you are not as active as you were in the last year.  We need to do the chemical stress test to see if the blood flow pattern to your heart is worse than it was back in 2019.  The orthopedic surgeon does not want to do surgery if your blood flow looks worse than it ever has been without [place] going forward with other test to see if we can improve the blood flow.  I do not know whether were going to find worse blood flow or not.  I just need to do the test to see if there is anything worse or whether it is looking the same.    Your last cholesterol reading was far higher than the one before that.  That was late last year.  We need to get another cholesterol profile when you are fasting.  You should be fasting when you come in for the heart test so get your blood drawn then.    My nurse or I will be in contact with you about the stress test results.  One of the things that we need to do is sort out whether if we think you can have your shoulder surgery whether would be smarter to have that done at [facility] where there is a cardiologist around and if he did have something go haywire they can deal with it versus being at [facility] were basically gets an outpatient center with out full service heart care.    I think it would be good for you to return for a recheck in six months. I can see you sooner if needed.   Call in if you have problems or questions.   Marissa Nestle, MD

## 2019-12-23 ENCOUNTER — Encounter: Admit: 2019-12-23 | Discharge: 2019-12-23

## 2019-12-23 DIAGNOSIS — I10 Essential (primary) hypertension: Secondary | ICD-10-CM

## 2019-12-23 DIAGNOSIS — C61 Malignant neoplasm of prostate: Secondary | ICD-10-CM

## 2019-12-23 DIAGNOSIS — I2510 Atherosclerotic heart disease of native coronary artery without angina pectoris: Secondary | ICD-10-CM

## 2019-12-23 DIAGNOSIS — J449 Chronic obstructive pulmonary disease, unspecified: Secondary | ICD-10-CM

## 2019-12-23 DIAGNOSIS — G4733 Obstructive sleep apnea (adult) (pediatric): Secondary | ICD-10-CM

## 2019-12-23 DIAGNOSIS — N183 CKD (chronic kidney disease) stage 3, GFR 30-59 ml/min (HCC): Secondary | ICD-10-CM

## 2019-12-23 DIAGNOSIS — E785 Hyperlipidemia, unspecified: Secondary | ICD-10-CM

## 2019-12-23 DIAGNOSIS — E119 Type 2 diabetes mellitus without complications: Secondary | ICD-10-CM

## 2019-12-23 DIAGNOSIS — I739 Peripheral vascular disease, unspecified: Secondary | ICD-10-CM

## 2019-12-23 DIAGNOSIS — Z720 Tobacco use: Secondary | ICD-10-CM

## 2019-12-25 ENCOUNTER — Encounter: Admit: 2019-12-25 | Discharge: 2019-12-25

## 2019-12-30 ENCOUNTER — Ambulatory Visit: Admit: 2019-12-30 | Discharge: 2019-12-30 | Payer: MEDICARE

## 2019-12-30 ENCOUNTER — Encounter: Admit: 2019-12-30 | Discharge: 2019-12-30

## 2019-12-30 DIAGNOSIS — I10 Essential (primary) hypertension: Secondary | ICD-10-CM

## 2019-12-30 MED ORDER — EUCALYPTUS-MENTHOL MM LOZG
1 | Freq: Once | ORAL | 0 refills | Status: AC | PRN
Start: 2019-12-30 — End: ?

## 2019-12-30 MED ORDER — SODIUM CHLORIDE 0.9 % IV SOLP
250 mL | INTRAVENOUS | 0 refills | Status: AC | PRN
Start: 2019-12-30 — End: ?

## 2019-12-30 MED ORDER — AMINOPHYLLINE 500 MG/20 ML IV SOLN
50 mg | INTRAVENOUS | 0 refills | Status: AC | PRN
Start: 2019-12-30 — End: ?

## 2019-12-30 MED ORDER — NITROGLYCERIN 0.4 MG SL SUBL
.4 mg | SUBLINGUAL | 0 refills | Status: AC | PRN
Start: 2019-12-30 — End: ?

## 2019-12-30 MED ORDER — REGADENOSON 0.4 MG/5 ML IV SYRG
.4 mg | Freq: Once | INTRAVENOUS | 0 refills | Status: CP
Start: 2019-12-30 — End: ?
  Administered 2019-12-30: 13:00:00 0.4 mg via INTRAVENOUS

## 2019-12-30 MED ORDER — ALBUTEROL SULFATE 90 MCG/ACTUATION IN HFAA
2 | RESPIRATORY_TRACT | 0 refills | Status: AC | PRN
Start: 2019-12-30 — End: ?
  Administered 2019-12-30: 13:00:00 2 via RESPIRATORY_TRACT

## 2020-03-24 ENCOUNTER — Ambulatory Visit: Admit: 2020-03-24 | Discharge: 2020-03-25 | Payer: MEDICARE

## 2020-03-24 ENCOUNTER — Encounter: Admit: 2020-03-24 | Discharge: 2020-03-24

## 2020-03-24 DIAGNOSIS — G4733 Obstructive sleep apnea (adult) (pediatric): Secondary | ICD-10-CM

## 2020-03-24 DIAGNOSIS — J329 Chronic sinusitis, unspecified: Secondary | ICD-10-CM

## 2020-03-24 DIAGNOSIS — N183 CKD (chronic kidney disease) stage 3, GFR 30-59 ml/min (HCC): Secondary | ICD-10-CM

## 2020-03-24 DIAGNOSIS — I2510 Atherosclerotic heart disease of native coronary artery without angina pectoris: Secondary | ICD-10-CM

## 2020-03-24 DIAGNOSIS — Z720 Tobacco use: Secondary | ICD-10-CM

## 2020-03-24 DIAGNOSIS — I10 Essential (primary) hypertension: Secondary | ICD-10-CM

## 2020-03-24 DIAGNOSIS — J449 Chronic obstructive pulmonary disease, unspecified: Secondary | ICD-10-CM

## 2020-03-24 DIAGNOSIS — I739 Peripheral vascular disease, unspecified: Secondary | ICD-10-CM

## 2020-03-24 DIAGNOSIS — C61 Malignant neoplasm of prostate: Secondary | ICD-10-CM

## 2020-03-24 DIAGNOSIS — E119 Type 2 diabetes mellitus without complications: Secondary | ICD-10-CM

## 2020-03-24 DIAGNOSIS — E785 Hyperlipidemia, unspecified: Secondary | ICD-10-CM

## 2020-03-24 MED ORDER — AZELASTINE 137 MCG (0.1 %) NA SPRA
2 | Freq: Two times a day (BID) | NASAL | 11 refills | 50.00000 days | Status: AC
Start: 2020-03-24 — End: ?

## 2020-03-25 DIAGNOSIS — F17200 Nicotine dependence, unspecified, uncomplicated: Principal | ICD-10-CM

## 2020-04-12 ENCOUNTER — Encounter: Admit: 2020-04-12 | Discharge: 2020-04-12

## 2020-04-12 MED ORDER — AMLODIPINE 5 MG PO TAB
ORAL_TABLET | Freq: Every day | 11 refills | Status: AC
Start: 2020-04-12 — End: ?

## 2020-05-13 ENCOUNTER — Encounter: Admit: 2020-05-13 | Discharge: 2020-05-13

## 2020-05-13 DIAGNOSIS — M25512 Pain in left shoulder: Secondary | ICD-10-CM

## 2020-05-13 DIAGNOSIS — M25511 Pain in right shoulder: Secondary | ICD-10-CM

## 2020-05-14 ENCOUNTER — Encounter: Admit: 2020-05-14 | Discharge: 2020-05-14

## 2020-05-14 ENCOUNTER — Ambulatory Visit: Admit: 2020-05-14 | Discharge: 2020-05-14 | Payer: MEDICARE

## 2020-05-14 ENCOUNTER — Ambulatory Visit: Admit: 2020-05-14 | Discharge: 2020-05-14

## 2020-05-14 DIAGNOSIS — E119 Type 2 diabetes mellitus without complications: Secondary | ICD-10-CM

## 2020-05-14 DIAGNOSIS — I739 Peripheral vascular disease, unspecified: Secondary | ICD-10-CM

## 2020-05-14 DIAGNOSIS — I2510 Atherosclerotic heart disease of native coronary artery without angina pectoris: Secondary | ICD-10-CM

## 2020-05-14 DIAGNOSIS — M25512 Pain in left shoulder: Secondary | ICD-10-CM

## 2020-05-14 DIAGNOSIS — J449 Chronic obstructive pulmonary disease, unspecified: Secondary | ICD-10-CM

## 2020-05-14 DIAGNOSIS — E785 Hyperlipidemia, unspecified: Secondary | ICD-10-CM

## 2020-05-14 DIAGNOSIS — I10 Essential (primary) hypertension: Secondary | ICD-10-CM

## 2020-05-14 DIAGNOSIS — G4733 Obstructive sleep apnea (adult) (pediatric): Secondary | ICD-10-CM

## 2020-05-14 DIAGNOSIS — C61 Malignant neoplasm of prostate: Secondary | ICD-10-CM

## 2020-05-14 DIAGNOSIS — M19019 Primary osteoarthritis, unspecified shoulder: Secondary | ICD-10-CM

## 2020-05-14 DIAGNOSIS — Z720 Tobacco use: Secondary | ICD-10-CM

## 2020-05-14 DIAGNOSIS — N183 CKD (chronic kidney disease) stage 3, GFR 30-59 ml/min (HCC): Secondary | ICD-10-CM

## 2020-05-19 ENCOUNTER — Encounter: Admit: 2020-05-19 | Discharge: 2020-05-19

## 2020-05-19 ENCOUNTER — Ambulatory Visit: Admit: 2020-05-19 | Discharge: 2020-05-19 | Payer: MEDICARE

## 2020-05-19 DIAGNOSIS — E119 Type 2 diabetes mellitus without complications: Secondary | ICD-10-CM

## 2020-05-19 DIAGNOSIS — J449 Chronic obstructive pulmonary disease, unspecified: Secondary | ICD-10-CM

## 2020-05-19 DIAGNOSIS — I2510 Atherosclerotic heart disease of native coronary artery without angina pectoris: Secondary | ICD-10-CM

## 2020-05-19 DIAGNOSIS — C61 Malignant neoplasm of prostate: Secondary | ICD-10-CM

## 2020-05-19 DIAGNOSIS — N183 CKD (chronic kidney disease) stage 3, GFR 30-59 ml/min (HCC): Secondary | ICD-10-CM

## 2020-05-19 DIAGNOSIS — G4733 Obstructive sleep apnea (adult) (pediatric): Secondary | ICD-10-CM

## 2020-05-19 DIAGNOSIS — I739 Peripheral vascular disease, unspecified: Secondary | ICD-10-CM

## 2020-05-19 DIAGNOSIS — I10 Essential (primary) hypertension: Secondary | ICD-10-CM

## 2020-05-19 DIAGNOSIS — E785 Hyperlipidemia, unspecified: Secondary | ICD-10-CM

## 2020-05-19 DIAGNOSIS — Z720 Tobacco use: Secondary | ICD-10-CM

## 2020-05-19 MED ORDER — MUPIROCIN 2 % TP OINT
Freq: Three times a day (TID) | TOPICAL | 1 refills | 11.00000 days | Status: AC
Start: 2020-05-19 — End: ?

## 2020-05-28 ENCOUNTER — Encounter: Admit: 2020-05-28 | Discharge: 2020-05-28

## 2020-07-05 ENCOUNTER — Encounter: Admit: 2020-07-05 | Discharge: 2020-07-05

## 2020-07-05 MED ORDER — ENTRESTO 97-103 MG PO TAB
ORAL_TABLET | Freq: Two times a day (BID) | 11 refills | Status: AC
Start: 2020-07-05 — End: ?

## 2020-07-27 ENCOUNTER — Ambulatory Visit: Admit: 2020-07-27 | Discharge: 2020-07-27 | Payer: MEDICARE

## 2020-07-27 ENCOUNTER — Encounter: Admit: 2020-07-27 | Discharge: 2020-07-27

## 2020-07-27 DIAGNOSIS — C61 Malignant neoplasm of prostate: Secondary | ICD-10-CM

## 2020-07-27 DIAGNOSIS — J449 Chronic obstructive pulmonary disease, unspecified: Secondary | ICD-10-CM

## 2020-07-27 DIAGNOSIS — I428 Other cardiomyopathies: Secondary | ICD-10-CM

## 2020-07-27 DIAGNOSIS — I10 Essential (primary) hypertension: Secondary | ICD-10-CM

## 2020-07-27 DIAGNOSIS — N183 CKD (chronic kidney disease) stage 3, GFR 30-59 ml/min (HCC): Secondary | ICD-10-CM

## 2020-07-27 DIAGNOSIS — G4733 Obstructive sleep apnea (adult) (pediatric): Secondary | ICD-10-CM

## 2020-07-27 DIAGNOSIS — E785 Hyperlipidemia, unspecified: Secondary | ICD-10-CM

## 2020-07-27 DIAGNOSIS — Z720 Tobacco use: Secondary | ICD-10-CM

## 2020-07-27 DIAGNOSIS — I2510 Atherosclerotic heart disease of native coronary artery without angina pectoris: Secondary | ICD-10-CM

## 2020-07-27 DIAGNOSIS — E119 Type 2 diabetes mellitus without complications: Secondary | ICD-10-CM

## 2020-07-27 DIAGNOSIS — E7800 Pure hypercholesterolemia, unspecified: Secondary | ICD-10-CM

## 2020-07-27 DIAGNOSIS — I5042 Chronic combined systolic (congestive) and diastolic (congestive) heart failure: Secondary | ICD-10-CM

## 2020-07-27 DIAGNOSIS — I739 Peripheral vascular disease, unspecified: Secondary | ICD-10-CM

## 2020-07-27 MED ORDER — MUPIROCIN 2 % TP OINT
Freq: Three times a day (TID) | TOPICAL | 1 refills | 11.00000 days | Status: AC
Start: 2020-07-27 — End: ?

## 2020-07-27 MED ORDER — JARDIANCE 10 MG PO TAB
10 mg | ORAL_TABLET | Freq: Every day | ORAL | 11 refills | Status: AC
Start: 2020-07-27 — End: ?

## 2020-07-27 NOTE — Progress Notes
Date of Service: 07/27/2020    Sean Henderson is a 65 y.o. male.       HPI     Sean Henderson returns for follow-up of his chronic LV systolic dysfunction and has a variable EF's over the years.  His lowest was about 25% slowest EF was in 2004 reported at [place] at 15%.  He had bare-metal stent to the LAD with post stenting EF 45% [place] in 2007.  Since then he has had EF as high as 60%.  His last echo in March 2021 showed EF 50% with no significant structural valve disease.  His EF then seemed improved compared to prior studies such as EF 35 to 40% in October 2019.    Today reports that he has stable exertional dyspnea.  This limits his enthusiasm for walking he also states that he lacks energy.  He does not have a regular exercise program.  He has chronic left shoulder pain has been evaluated at [facility] and [place] for shoulder replacement.  His last consultation with Dr. Hart Robinsons at [facility] indicated Dr. Caryn Section thought there was not sufficient anatomic abnormality in the shoulder to warrant replacement that would have some issues with safety related to his heart failure and other issues related to strength of indication due to the lack of potential surgical benefit.  At this point Sean Henderson prefers not to proceed with surgery when the benefits are speculative and the risks are significant.  He states that he has been cut on enough and at this point does not want to do anything elective unless the benefits are clearly defined and safely attainable.         Vitals:    07/27/20 1628   BP: 128/78   BP Source: Arm, Right Upper   Patient Position: Sitting   Pulse: 68   SpO2: 97%   Weight: 109.9 kg (242 lb 3.2 oz)   Height: 180.3 cm (5' 11)   PainSc: Zero     Body mass index is 33.78 kg/m?Marland Kitchen     Past Medical History  Patient Active Problem List    Diagnosis Date Noted   ? Shoulder arthritis 05/14/2020   ? Diabetes mellitus type 2 with peripheral artery disease (HCC) 04/07/2019   ? Bilateral renal cysts 03/04/2018   ? Grief reaction 03/04/2018   ? Influenza A 05/13/2017   ? Prostate cancer (HCC) 03/20/2017   ? CKD (chronic kidney disease) stage 3, GFR 30-59 ml/min (HCC) 02/14/2017   ? Peripheral artery disease (HCC) with repeat stenting left leg 11/25/2014     2014 Stent left leg [facility] claudication and distal perfusion improved.   2016 Repeat stent L leg [facility]      ? Right flank pain 10/15/2013   ? SOB (shortness of breath) 04/24/2011   ? Angina, class II [facility]) 04/24/2011     December 2020 reports occasional chest pain with marked overexertion     ? History of repair of left rotator cuff 06/20/2010     2010     ? Tobacco dependence 03/02/2010     Peak tobacco consumption 4 ppd     ? COPD (chronic obstructive pulmonary disease) (HCC) 04/01/2009     A. 2008 PFT FEV1/FEC 68%, FEV 56%, TLC 86%  B. Mod to severe not on  inhalers followed by [facility] Dr Lowella Petties     ? Obstructive sleep apnea  10/28/2008     A. 07/22/08- Sleep study: [place] Mild obstructive breathing overall,moderate degree  supine and during REM sleep non-supine.Mild Desats & mild sleep disturbance.   B. 10/06/08- CPAP titration: to 14cm      ? Essential hypertension 12/09/2007   ? HLD (hyperlipidemia) 12/09/2007     A. 11/04 total 189 trig 167 HDL 29 LDL 144   B. 1/05 total 98 trig 397 HDL 30 LDL 54 Lipitor 40>> 10/06- start Vytorin 10-20  C. 6/07- total 104, trig 91, HDL 27, LDL 67- Vytorin 10-20  E. 16/1/09- total 160, trig 89, HDL 21, LDL 118 Myalgias:DC vytorin, Rx Crestor 10, niaspan 1000  G. 03/24/09- total 102, trig 154, HDL 25, LDL 60- simvastatin 40, niaspan 1gm  I. 09/12/10- total 106, trig 79,HDL 29, LDL 64- simvastatin 20, niaspan 1gm     ? CAD (coronary artery disease), native coronary artery 12/09/2007     A.2/04 Normal coronaries [facility] EF 15%   B. 2/07- Chest pain, admit [place]: Bare metal stent to 90% mLAD, EF 45%  C. 12/09/07 Angina, Cath [place]: BareMetalStent 80% mLAD, Kissing fashion to 80% 2nd Dx   D. 06/17/08- dobutamine stress echo:  LV 5.2. EF 60%. No ischemia.   E. 08/25/09- Dobutamine stress echo: Mild concentric LVH. EF 60%. LA is mildly enlarged. No ischemia.  F.  12/01/10 - Dobut   echo:  Terminated w/ HTN, HR<85%   EF - 60%  LVEDD 6.3 cm.  G  5/13 Reg [place] EF 52%, no ischemia  12/08/14: Cath- patent prior stent, no obstructive CAD  Dr. Micheline Rough   02/14/17 Cath D/T EF decline: : LAD/ Dx2 stents patent: Mod OM, RCA & PDA disease~ no change from  2016.     ? 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 Normal coronaries.   B. 10/04- Echo: LVIDD 8.1 , EF 25% Valves OK  C. 4/05- Echo: EF 45%. LV 5.5 cm>>> BNP 24. tolerates hydralazine, headaches w/ imdur  D. 2/06, Echo: LV 5.6, EF 50%   E. 2/07- Chest pain, admit [place]: Bare metal stent to 90% mLAD, EF 45%  F. 02/19/07- Dobut echo: LV 6.4, EF 40%, No ischemia, Hypertensive BP response.   G. 12/09/07 Angina, Cath [place]: BareMetalStent for 80% mLAD, Kissing balloon 80% Dx2   H. 2/10 Echo EF 60%  I.  8/11 Dobutamine echo EF 60% no ischemia post stress by EKG or Echo.   J 5/13 Reg [place]all EF 52%, no ischemia     ? Encounter for long-term (current) use of other medications 09/11/2006         Review of Systems   Constitutional: Positive for malaise/fatigue.   HENT: Negative.    Eyes: Negative.    Cardiovascular: Positive for dyspnea on exertion.   Respiratory: Negative.    Endocrine: Negative.    Hematologic/Lymphatic: Negative.    Skin: Negative.    Musculoskeletal: Positive for back pain.   Gastrointestinal: Negative.    Genitourinary: Negative.    Neurological: Negative.    Psychiatric/Behavioral: Negative.    Allergic/Immunologic: Negative.        Physical Exam  General Appearance: Overweight,   no distress.  Wearing a cloth face mask   Skin: Skin warm and dry. Eyes: pupils equal and round, sclera injected Carotids:  no bruits   Neck Veins: CVP less than 6 cm, no hepatojugular reflux, No V wave  Lungs: breathing comfortably, lungs clear to  percussion and auscultation., no cough, no rales or  rhonchi, Scattered expiratory wheezes  Cardiac: Rhythm regular. PMI not palpable. S1 and S2 normal. Fourth heart sound with no rub or third sound. No murmur  Abdomen soft,. Liver not enlarged. No abdominal bruit, aorta not palpable. + bowel sounds.   Normal PT pulses with no  edema. No femoral bruits.   DP and PT pulses 2-3+.  Normal strength and gross neurologic function   Good insight, clear historian, no depression    Cardiovascular Studies  His LDL when last checked was over 120.  We have not seen lipid profile on him in a couple of years.  He is at LDLs on statins as low as 60.  He is now on atorvastatin 40 and Zetia 10.  We need to get a fasting cholesterol profile to verify that his LDL is in a target range.  Cardiovascular Health Factors template inserted into encounter by administrative mandate was reviewed, factors managed as appropriate and template deleted to reduce chart bloat.         Problems Addressed Today  No diagnosis found.    Assessment and Plan      Sean Henderson may be having exercise intolerance related to residual effects of his heart failure even though his ejection fraction has improved.  Given the recent data on Jardiance improving outcomes and heart failure with all degrees of ejection fraction I am starting him on Jardiance 10 mg daily with warnings about urinary tract infection and perineal infection.  I am not sure that he will need to stay on hydralazine and nitrates which she is only taking twice a day if he gets on next generation heart failure therapies Jardiance and Entresto.  I suspect that the benefits seen in the a half try with hydralazine and nitrates would not have been definable population pretreated with Sherryll Burger and Jardiance.    Hopefully when I see him again his exercise tolerance will of improved.  He talked about his maximum longevity as he like to stay alive and see his children and grandchildren.  I encouraged him to try to stick with these new medicines as they are survival benefit may be additive and quite significant for him despite the costs.    I had like to see him again in 3 months.    He has not had a fasting cholesterol in quite some time.  I want him to get a fasting cholesterol and BNP at  which is close to his home and easily accessible.  We will see him again in 3 months sooner if the need arises.  I told him that he may not need as much as needed furosemide once he gets on the Jardiance    30 minutes were expended as the total time for this encounter.  The time was spent reviewing records,  interviewing patient, doing exam, developing diagnosis, creating treatment plan written in patient oriented  terminology  for the AVS, explaining it to the patient and entering further information in the EMR.      NB: The free text in this document was generated through Dragon(TM) software with editing and proofreading  done by the author of this document Dr. Mable Paris MD, [facility] principally at the point of care. Some errors may persist.  If there are questions about content in this document please contact Dr. Hale Bogus.    The written information I provided Sean Henderson at the conclusion of today's encounter is as  follows:    Patient Instructions   I think London Pepper  may help your exercise capacity and help protect your heart.  It is 10 mg once a day.  There is no dose adjustment.  It might clear a little fluid the main thing is it runs more blood sugar more blood sugar and in your urine.  When you have more sugar in your urine there is more chance of an infection.  If you start to get burning or discoloration of your urine you may have a urinary tract infection.  You can call your urologist or more likely your primary care physician to get a urinary analysis to see if there is white cells in the urine.  That is only about 1 and 20 with [place] Pepper is not a sure thing but I want to mention it to you.    The stroke and heart help your heart function and make you a little better with regard to energy and breathing.  There is no dose adjustment.  Like to see you back in about 3 months to see how you are doing.    I do not know how old Duke going to get.  When you medicines come out we are helping people live longer all the time.  The [place] Pepper is a step in the right direction.  You are on that and you are on the [facility].  Those are the 2 best new drugs for heart failure.  The old line Coreg still works.  Once again on the Jardiance I think you could consider stopping the hydralazine and Imdur because those do not do much good compared to the [place].  With [place] Pepper and Entresto both on board I think the benefits of hydralazine and nitrates will be overwhelmed by the benefits of those 2 new drugs.    See me in 3 months.  I do not think you need an echo or anything.  I like you to get a blood test called BNP you also need a fasting cholesterol.  Find a time even going in the morning before you eat you have eaten and get blood work done.    I think it would be good for you to return for a recheck in three months.     I can see you sooner if needed.   Use MyChart to message me or if necessary call in if you have problems or questions  Marissa Nestle, MD                      Current Medications (including today's revisions)  ? albuterol sulfate (PROAIR HFA) 90 mcg/actuation HFA aerosol inhaler Inhale one puff to two puffs by mouth into the lungs every 4 hours as needed for Wheezing (or shortness of breath).   ? amLODIPine (NORVASC) 5 mg tablet TAKE 1 TABLET BY MOUTH EVERY DAY   ? ascorbic acid/multivit-min (EMERGEN-C PO) Take 1 tablet by mouth daily.   ? aspirin EC 81 mg tablet Take 1 Tab by mouth daily.   ? atorvastatin (LIPITOR) 40 mg tablet Take one tablet by mouth daily.   ? azelastine (ASTELIN) 137 mcg (0.1 %) nasal spray Apply two sprays to each nostril as directed twice daily. Use in each nostril as directed   ? benzonatate (TESSALON) 200 mg capsule Take one capsule by mouth three times daily as needed for Cough for up to 30 doses.   ? budesonide-formoterol (SYMBICORT HFA) 160-4.5 mcg/actuation inhalation Inhale two puffs by mouth into the lungs twice daily.   ? carvedilol (COREG) 25 mg  tablet Take 25 mg by mouth twice daily with meals. Take with food.   ? clopiDOGrel (PLAVIX) 75 mg tablet Take 75 mg by mouth daily. Indications: For peripheral stents - managed and to be filled by [facility] physician   ? ENTRESTO 97-103 mg tablet TAKE 1 TABLET BY MOUTH TWICE A DAY   ? ezetimibe (ZETIA) 10 mg tablet Take one tablet by mouth daily.   ? furosemide (LASIX) 20 mg tablet Take 20 mg by mouth every morning.   ? gabapentin (NEURONTIN) 100 mg PO capsule Take 1 Cap by mouth three times daily.   ? hydrALAZINE (APRESOLINE) 100 mg tablet Take one tablet by mouth three times daily.   ? HYDROcodone/acetaminophen (NORCO) 5/325 mg tablet Take 1 tablet by mouth twice daily as needed for Pain   ? isosorbide mononitrate ER (IMDUR) 30 mg tablet TAKE 1 TABLET BY MOUTH EVERY MORNING   ? multivit-min/folic/vit K/lycop (MEN'S 50 PLUS DAILY FORMULA PO) Take 1 tablet by mouth daily.   ? mupirocin (CENTANY) 2 % topical ointment Apply  topically to affected area three times daily. Apply to the front of the nose as needed up to three times per day   ? nicotine (NICODERM CQ) 21 mg/day patch Apply one patch to top of skin as directed every 24 hours. Rotate patch location.  Indications: Stop Smoking   ? nicotine(+) (NICOTROL) 10 mg inhaler Inhale one puff by mouth into the lungs as Needed. Puff by mouth as needed. May use 6-16 cartridges per day as needed for up to 6 months.   ? nitroglycerin (NITROSTAT) 0.4 mg tablet Place one tab under tongue every 5 mins not to exceed 3 tabs;as needed for chest pain. If chest pain persists go to the ER or call 911.   ? spironolactone (ALDACTONE) 50 mg tablet Take one tablet by mouth daily. Take with food.   ? tiotropium bromide (SPIRIVA RESPIMAT) 2.5 mcg/actuation inhaler Inhale two puffs by mouth into the lungs daily.   ? traZODone (DESYREL) 100 mg tablet Take 2 tablets by mouth at bedtime as needed.

## 2020-08-03 ENCOUNTER — Encounter: Admit: 2020-08-03 | Discharge: 2020-08-03

## 2020-08-03 MED ORDER — SPIRONOLACTONE 50 MG PO TAB
ORAL_TABLET | Freq: Every day | ORAL | 11 refills | 46.00000 days | Status: AC
Start: 2020-08-03 — End: ?

## 2020-09-02 ENCOUNTER — Encounter: Admit: 2020-09-02 | Discharge: 2020-09-02

## 2020-09-02 MED ORDER — SPIRONOLACTONE 50 MG PO TAB
ORAL_TABLET | Freq: Every day | 11 refills
Start: 2020-09-02 — End: ?

## 2020-09-08 ENCOUNTER — Encounter: Admit: 2020-09-08 | Discharge: 2020-09-08

## 2020-09-08 MED ORDER — JARDIANCE 10 MG PO TAB
10 mg | ORAL_TABLET | Freq: Every day | ORAL | 3 refills | Status: AC
Start: 2020-09-08 — End: ?

## 2020-09-08 MED ORDER — SPIRONOLACTONE 50 MG PO TAB
50 mg | ORAL_TABLET | Freq: Every day | ORAL | 3 refills | 46.00000 days | Status: AC
Start: 2020-09-08 — End: ?

## 2020-09-15 ENCOUNTER — Ambulatory Visit: Admit: 2020-09-15 | Discharge: 2020-09-16 | Payer: MEDICARE

## 2020-09-15 ENCOUNTER — Encounter: Admit: 2020-09-15 | Discharge: 2020-09-15

## 2020-09-15 DIAGNOSIS — I10 Essential (primary) hypertension: Secondary | ICD-10-CM

## 2020-09-15 DIAGNOSIS — E785 Hyperlipidemia, unspecified: Secondary | ICD-10-CM

## 2020-09-15 DIAGNOSIS — G4733 Obstructive sleep apnea (adult) (pediatric): Secondary | ICD-10-CM

## 2020-09-15 DIAGNOSIS — C61 Malignant neoplasm of prostate: Secondary | ICD-10-CM

## 2020-09-15 DIAGNOSIS — I739 Peripheral vascular disease, unspecified: Secondary | ICD-10-CM

## 2020-09-15 DIAGNOSIS — I2510 Atherosclerotic heart disease of native coronary artery without angina pectoris: Secondary | ICD-10-CM

## 2020-09-15 DIAGNOSIS — Z720 Tobacco use: Secondary | ICD-10-CM

## 2020-09-15 DIAGNOSIS — N183 CKD (chronic kidney disease) stage 3, GFR 30-59 ml/min (HCC): Secondary | ICD-10-CM

## 2020-09-15 DIAGNOSIS — E119 Type 2 diabetes mellitus without complications: Secondary | ICD-10-CM

## 2020-09-15 DIAGNOSIS — J449 Chronic obstructive pulmonary disease, unspecified: Secondary | ICD-10-CM

## 2020-09-15 MED ORDER — NEBULIZER ACCESSORIES MISC KIT
PACK | 0 refills | 30.00000 days | Status: AC
Start: 2020-09-15 — End: ?

## 2020-09-15 MED ORDER — ALBUTEROL SULFATE 2.5 MG /3 ML (0.083 %) IN NEBU
2.5 mg | RESPIRATORY_TRACT | 11 refills | Status: AC
Start: 2020-09-15 — End: ?

## 2020-09-15 MED ORDER — BUDESONIDE-GLYCOPYR-FORMOTEROL 160-9-4.8 MCG/ACTUATION IN HFAA
2 | Freq: Two times a day (BID) | RESPIRATORY_TRACT | 3 refills | 30.00000 days | Status: AC
Start: 2020-09-15 — End: ?

## 2020-09-15 MED ORDER — ALBUTEROL SULFATE 90 MCG/ACTUATION IN HFAA
1-2 | RESPIRATORY_TRACT | 3 refills | Status: AC | PRN
Start: 2020-09-15 — End: ?

## 2020-09-15 NOTE — Patient Instructions
It was a pleasure seeing you today.     My nurse is Kendra Sewell, RN.  She can be reached at [phone].    Please contact my nurse with any signs and symptoms of worsening productive cough with thick secretions, blood in sputum, chest pain or tightness, shortness of breath, fever, chills, night sweats, or any questions or concerns.    For refills on medications, please have your pharmacy request a refill authorization either electronically or via fax to our office at [phone]. Please allow at least 3 business days for refill requests.    For urgent issues after business hours, weekends, or holidays please call [phone] and request for the pulmonary fellow to be paged.    If you need to cancel or reschedule an appointment, please call [phone].

## 2020-09-16 ENCOUNTER — Encounter: Admit: 2020-09-16 | Discharge: 2020-09-16

## 2020-09-16 DIAGNOSIS — C61 Malignant neoplasm of prostate: Secondary | ICD-10-CM

## 2020-09-16 DIAGNOSIS — F17200 Nicotine dependence, unspecified, uncomplicated: Secondary | ICD-10-CM

## 2020-09-16 DIAGNOSIS — J449 Chronic obstructive pulmonary disease, unspecified: Principal | ICD-10-CM

## 2020-09-16 DIAGNOSIS — Z87891 Personal history of nicotine dependence: Secondary | ICD-10-CM

## 2020-09-16 DIAGNOSIS — E785 Hyperlipidemia, unspecified: Secondary | ICD-10-CM

## 2020-09-16 DIAGNOSIS — I739 Peripheral vascular disease, unspecified: Secondary | ICD-10-CM

## 2020-09-16 DIAGNOSIS — E119 Type 2 diabetes mellitus without complications: Secondary | ICD-10-CM

## 2020-09-16 DIAGNOSIS — I10 Essential (primary) hypertension: Secondary | ICD-10-CM

## 2020-09-16 DIAGNOSIS — G4733 Obstructive sleep apnea (adult) (pediatric): Secondary | ICD-10-CM

## 2020-09-16 DIAGNOSIS — N183 CKD (chronic kidney disease) stage 3, GFR 30-59 ml/min (HCC): Secondary | ICD-10-CM

## 2020-09-16 DIAGNOSIS — Z720 Tobacco use: Secondary | ICD-10-CM

## 2020-09-16 DIAGNOSIS — I2510 Atherosclerotic heart disease of native coronary artery without angina pectoris: Secondary | ICD-10-CM

## 2020-09-16 NOTE — Telephone Encounter
Patient referred for low-dose lung screening CT scan.    Contacted patient to complete screening for high -risk criteria as follows:      Patient contacted: Yes   The patient is currently: 65 y.o.    Current Smoker?  Yes     Smoking history: 48 years at 5 packs per day     Pack years: 240 pack years    Quit smoking within the past 15 years if not currently smoking?  No    Years since quit: NA    Do you have any concerning symptoms:  Asymptomatic    Personal history of lung cancer or other malignancy: Yes     Prior CT CHEST within the past year?   No    If yes, images and report requested:  NA    High Risk Criteria:   Lung Screen met: Patient scheduled as follows:  APP Visit: 11/23/2020  CT Lung Screening Scan: 11/23/2020  Follow-up Results Phone Call: 11/23/2020  Patient provided appointment information and verbalized understanding to all instructions.    Referring Provider: Alden Benjamin     Preferred location [facility] or [employer]): [facility]

## 2020-09-17 ENCOUNTER — Encounter: Admit: 2020-09-17 | Discharge: 2020-09-17

## 2020-09-17 DIAGNOSIS — E785 Hyperlipidemia, unspecified: Secondary | ICD-10-CM

## 2020-09-17 DIAGNOSIS — I10 Essential (primary) hypertension: Secondary | ICD-10-CM

## 2020-09-17 DIAGNOSIS — I739 Peripheral vascular disease, unspecified: Secondary | ICD-10-CM

## 2020-09-17 DIAGNOSIS — E119 Type 2 diabetes mellitus without complications: Secondary | ICD-10-CM

## 2020-09-17 DIAGNOSIS — J449 Chronic obstructive pulmonary disease, unspecified: Secondary | ICD-10-CM

## 2020-09-17 DIAGNOSIS — G4733 Obstructive sleep apnea (adult) (pediatric): Secondary | ICD-10-CM

## 2020-09-17 DIAGNOSIS — C61 Malignant neoplasm of prostate: Secondary | ICD-10-CM

## 2020-09-17 DIAGNOSIS — Z720 Tobacco use: Secondary | ICD-10-CM

## 2020-09-17 DIAGNOSIS — N183 CKD (chronic kidney disease) stage 3, GFR 30-59 ml/min (HCC): Secondary | ICD-10-CM

## 2020-09-17 DIAGNOSIS — I2510 Atherosclerotic heart disease of native coronary artery without angina pectoris: Secondary | ICD-10-CM

## 2020-09-17 MED ORDER — ENTRESTO 97-103 MG PO TAB
ORAL_TABLET | Freq: Two times a day (BID) | 3 refills | Status: AC
Start: 2020-09-17 — End: ?

## 2020-09-19 ENCOUNTER — Encounter: Admit: 2020-09-19 | Discharge: 2020-09-19

## 2020-09-19 MED ORDER — JARDIANCE 10 MG PO TAB
ORAL_TABLET | Freq: Every day | 11 refills
Start: 2020-09-19 — End: ?

## 2020-09-19 MED ORDER — AMLODIPINE 5 MG PO TAB
ORAL_TABLET | Freq: Every day | 11 refills
Start: 2020-09-19 — End: ?

## 2020-09-19 MED ORDER — SYMBICORT 160-4.5 MCG/ACTUATION IN HFAA
Freq: Two times a day (BID) | 11 refills
Start: 2020-09-19 — End: ?

## 2020-09-22 ENCOUNTER — Ambulatory Visit: Admit: 2020-09-22 | Discharge: 2020-09-22 | Payer: MEDICARE

## 2020-09-22 ENCOUNTER — Encounter: Admit: 2020-09-22 | Discharge: 2020-09-22

## 2020-09-22 ENCOUNTER — Ambulatory Visit: Admit: 2020-09-22 | Discharge: 2020-09-22

## 2020-09-22 DIAGNOSIS — J449 Chronic obstructive pulmonary disease, unspecified: Secondary | ICD-10-CM

## 2020-09-26 ENCOUNTER — Encounter: Admit: 2020-09-26 | Discharge: 2020-09-26

## 2020-09-26 NOTE — Telephone Encounter
Called patient to relay results of PFTs which are almost identical to the testing in 2020. We are still pending an exercise oximetry

## 2020-10-27 ENCOUNTER — Encounter: Admit: 2020-10-27 | Discharge: 2020-10-27

## 2020-10-27 MED ORDER — AZELASTINE 137 MCG (0.1 %) NA SPRA
2 | Freq: Two times a day (BID) | NASAL | 11 refills | 50.00000 days | Status: AC
Start: 2020-10-27 — End: ?

## 2020-10-27 NOTE — Telephone Encounter
Received refill request from pharmacy for [place]. Pt is to continue on med and has f/u scheduled. Okay to refill per protocol.

## 2020-11-23 ENCOUNTER — Encounter: Admit: 2020-11-23 | Discharge: 2020-11-23

## 2020-11-23 ENCOUNTER — Ambulatory Visit: Admit: 2020-11-23 | Discharge: 2020-11-23 | Payer: MEDICARE

## 2020-11-23 ENCOUNTER — Ambulatory Visit: Admit: 2020-11-23 | Discharge: 2020-11-23

## 2020-11-23 DIAGNOSIS — Z122 Encounter for screening for malignant neoplasm of respiratory organs: Secondary | ICD-10-CM

## 2020-11-23 DIAGNOSIS — Z87891 Personal history of nicotine dependence: Secondary | ICD-10-CM

## 2020-11-24 ENCOUNTER — Encounter: Admit: 2020-11-24 | Discharge: 2020-11-24

## 2020-12-31 ENCOUNTER — Encounter: Admit: 2020-12-31 | Discharge: 2020-12-31

## 2020-12-31 ENCOUNTER — Ambulatory Visit: Admit: 2020-12-31 | Discharge: 2020-12-31 | Payer: MEDICARE

## 2020-12-31 DIAGNOSIS — E785 Hyperlipidemia, unspecified: Secondary | ICD-10-CM

## 2020-12-31 DIAGNOSIS — I739 Peripheral vascular disease, unspecified: Secondary | ICD-10-CM

## 2020-12-31 DIAGNOSIS — J449 Chronic obstructive pulmonary disease, unspecified: Secondary | ICD-10-CM

## 2020-12-31 DIAGNOSIS — C61 Malignant neoplasm of prostate: Secondary | ICD-10-CM

## 2020-12-31 DIAGNOSIS — E119 Type 2 diabetes mellitus without complications: Secondary | ICD-10-CM

## 2020-12-31 DIAGNOSIS — N183 CKD (chronic kidney disease) stage 3, GFR 30-59 ml/min (HCC): Secondary | ICD-10-CM

## 2020-12-31 DIAGNOSIS — I10 Essential (primary) hypertension: Secondary | ICD-10-CM

## 2020-12-31 DIAGNOSIS — I2510 Atherosclerotic heart disease of native coronary artery without angina pectoris: Secondary | ICD-10-CM

## 2020-12-31 DIAGNOSIS — Z720 Tobacco use: Secondary | ICD-10-CM

## 2020-12-31 DIAGNOSIS — G4733 Obstructive sleep apnea (adult) (pediatric): Secondary | ICD-10-CM

## 2020-12-31 LAB — PROTEIN/CR RATIO,UR RAN
PROT CREAT RAT/CAL: 0.3 — ABNORMAL HIGH (ref <0.15)
UR CREATININE, RAN: 228 mg/dL
UR TOTAL PROTEIN,RAN: 75 mg/dL

## 2021-01-03 ENCOUNTER — Encounter: Admit: 2021-01-03 | Discharge: 2021-01-03

## 2021-01-03 ENCOUNTER — Ambulatory Visit: Admit: 2021-01-03 | Discharge: 2021-01-03 | Payer: MEDICARE

## 2021-01-03 DIAGNOSIS — N183 Stage 3 chronic kidney disease, unspecified whether stage 3a or 3b CKD (HCC): Secondary | ICD-10-CM

## 2021-01-03 LAB — PHOSPHORUS: PHOSPHORUS: 3.1 mg/dL (ref 2.0–4.5)

## 2021-01-03 LAB — CBC AND DIFF
HEMATOCRIT: 45 % (ref 40–50)
RBC COUNT: 5.3 M/UL (ref 4.4–5.5)
WBC COUNT: 6.1 K/UL (ref 4.5–11.0)

## 2021-01-03 LAB — COMPREHENSIVE METABOLIC PANEL
ALBUMIN: 4.2 g/dL — ABNORMAL HIGH (ref 3.5–5.0)
ALK PHOSPHATASE: 100 U/L (ref 25–110)
ALT: 16 U/L (ref 7–56)
ANION GAP: 9 K/UL — ABNORMAL HIGH (ref 3–12)
AST: 13 U/L (ref 7–40)
BLD UREA NITROGEN: 26 mg/dL — ABNORMAL HIGH (ref 7–25)
CALCIUM: 9 mg/dL (ref 8.5–10.6)
CHLORIDE: 107 MMOL/L (ref 98–110)
CO2: 25 MMOL/L (ref 21–30)
EGFR: 45 mL/min — ABNORMAL LOW (ref >60)
GLUCOSE,PANEL: 121 mg/dL — ABNORMAL HIGH (ref 70–100)
POTASSIUM: 4 MMOL/L (ref 3.5–5.1)
SODIUM: 141 MMOL/L (ref 137–147)
TOTAL BILIRUBIN: 0.6 mg/dL (ref 0.3–1.2)
TOTAL PROTEIN: 6.5 g/dL (ref 6.0–8.0)

## 2021-01-09 ENCOUNTER — Encounter: Admit: 2021-01-09 | Discharge: 2021-01-09

## 2021-01-10 ENCOUNTER — Encounter: Admit: 2021-01-10 | Discharge: 2021-01-10

## 2021-01-12 ENCOUNTER — Encounter: Admit: 2021-01-12 | Discharge: 2021-01-12

## 2021-01-25 ENCOUNTER — Encounter: Admit: 2021-01-25 | Discharge: 2021-01-25

## 2021-01-25 NOTE — Telephone Encounter
Notified pt of lab results. Reminded pt to get renal US. A few times last week his BP was 200/100 - denies having any sxs at that time. Reports BP ranging 150s-160s/85-90s but some days BP is OK, checks BP at least 2 hours after meds. This AM his BP was much better at 130/77. Taking amlodipine 5mg , carvedilol 25mg  BID, lasix 20mg  QD, hydralazine 100mg  TID, imdur 30mg  QD, and spironolactone 50mg  QD. Routing to Dr. Kirby Funk to advise.

## 2021-01-25 NOTE — Telephone Encounter
Attempted to contact pt regarding results Dr. Kirby Funk sent to pt via [employer]. LVM to return call.

## 2021-04-06 ENCOUNTER — Encounter: Admit: 2021-04-06 | Discharge: 2021-04-06

## 2021-04-06 ENCOUNTER — Ambulatory Visit: Admit: 2021-04-06 | Discharge: 2021-04-06 | Payer: MEDICARE

## 2021-04-06 DIAGNOSIS — F17200 Nicotine dependence, unspecified, uncomplicated: Secondary | ICD-10-CM

## 2021-04-06 DIAGNOSIS — Z720 Tobacco use: Secondary | ICD-10-CM

## 2021-04-06 DIAGNOSIS — N183 CKD (chronic kidney disease) stage 3, GFR 30-59 ml/min (HCC): Secondary | ICD-10-CM

## 2021-04-06 DIAGNOSIS — J449 Chronic obstructive pulmonary disease, unspecified: Secondary | ICD-10-CM

## 2021-04-06 DIAGNOSIS — C61 Malignant neoplasm of prostate: Secondary | ICD-10-CM

## 2021-04-06 DIAGNOSIS — E785 Hyperlipidemia, unspecified: Secondary | ICD-10-CM

## 2021-04-06 DIAGNOSIS — I10 Essential (primary) hypertension: Secondary | ICD-10-CM

## 2021-04-06 DIAGNOSIS — I2510 Atherosclerotic heart disease of native coronary artery without angina pectoris: Secondary | ICD-10-CM

## 2021-04-06 DIAGNOSIS — G4733 Obstructive sleep apnea (adult) (pediatric): Secondary | ICD-10-CM

## 2021-04-06 DIAGNOSIS — E119 Type 2 diabetes mellitus without complications: Secondary | ICD-10-CM

## 2021-04-06 DIAGNOSIS — I739 Peripheral vascular disease, unspecified: Secondary | ICD-10-CM

## 2021-04-06 MED ORDER — BUDESONIDE-GLYCOPYR-FORMOTEROL 160-9-4.8 MCG/ACTUATION IN HFAA
2 | Freq: Two times a day (BID) | RESPIRATORY_TRACT | 3 refills | 30.00000 days | Status: AC
Start: 2021-04-06 — End: ?

## 2021-04-06 MED ORDER — ALBUTEROL SULFATE 2.5 MG /3 ML (0.083 %) IN NEBU
2.5 mg | RESPIRATORY_TRACT | 11 refills | Status: AC
Start: 2021-04-06 — End: ?

## 2021-04-06 NOTE — Patient Instructions
It was a pleasure seeing you today.      My nurse is Kendra Sewell, RN.  She can be reached at [phone].    Please contact my nurse with any signs and symptoms of worsening productive cough with thick secretions, blood in sputum, chest pain or tightness, shortness of breath, fever, chills, night sweats, or any questions or concerns.    For refills on medications, please have your pharmacy request a refill authorization either electronically or via fax to our office at [phone]. Please allow at least 3 business days for refill requests.    For urgent issues after business hours, weekends, or holidays please call [phone] and request for the pulmonary fellow to be paged.    If you need to cancel or reschedule an appointment, please call [phone].

## 2021-04-08 ENCOUNTER — Encounter: Admit: 2021-04-08 | Discharge: 2021-04-08

## 2021-04-08 DIAGNOSIS — I10 Essential (primary) hypertension: Secondary | ICD-10-CM

## 2021-04-08 DIAGNOSIS — I739 Peripheral vascular disease, unspecified: Secondary | ICD-10-CM

## 2021-04-08 DIAGNOSIS — Z720 Tobacco use: Secondary | ICD-10-CM

## 2021-04-08 DIAGNOSIS — N183 CKD (chronic kidney disease) stage 3, GFR 30-59 ml/min (HCC): Secondary | ICD-10-CM

## 2021-04-08 DIAGNOSIS — C61 Malignant neoplasm of prostate: Secondary | ICD-10-CM

## 2021-04-08 DIAGNOSIS — E119 Type 2 diabetes mellitus without complications: Secondary | ICD-10-CM

## 2021-04-08 DIAGNOSIS — I2510 Atherosclerotic heart disease of native coronary artery without angina pectoris: Secondary | ICD-10-CM

## 2021-04-08 DIAGNOSIS — E785 Hyperlipidemia, unspecified: Secondary | ICD-10-CM

## 2021-04-08 DIAGNOSIS — J449 Chronic obstructive pulmonary disease, unspecified: Secondary | ICD-10-CM

## 2021-04-08 DIAGNOSIS — G4733 Obstructive sleep apnea (adult) (pediatric): Secondary | ICD-10-CM

## 2021-04-20 ENCOUNTER — Encounter: Admit: 2021-04-20 | Discharge: 2021-04-20

## 2021-04-20 NOTE — Telephone Encounter
1st  attempt to contact/schedule. Today I attempted to contact the patient to schedule an initial orientation appointment. The patient did not answer and the call went to voicemail. A message was left explaining the purpose of the phone call, with the request the patient contact our department at their convenience. Cardiopulmonary Rehab will follow-up at a later date.

## 2021-05-12 ENCOUNTER — Encounter: Admit: 2021-05-12 | Discharge: 2021-05-12

## 2021-05-12 NOTE — Telephone Encounter
2nd  attempt to contact/schedule. Today I contacted the patient to schedule an orientation appointment. The patient expressed interest in participating but due to receiving home health, would not like to schedule at this time. Before ending the call with the patient I shared with them the department's phone number [phone]) and requested they contact us when they are ready to schedule their initial orientation appointment. Cardiopulmonary Rehab will follow up with patient at a later date. All questions answered at this time.

## 2021-05-24 ENCOUNTER — Encounter: Admit: 2021-05-24 | Discharge: 2021-05-24

## 2021-05-24 NOTE — Telephone Encounter
3rd attempt to contact/schedule. Today I contacted the patient to schedule an orientation appointment. The patient expressed interest in participating but due to wanting to know if pulmonary rehab will be covered while they participate in home health, would not like to schedule at this time. Before ending the call with the patient I shared with them the department's phone number [phone]) and requested they contact us when they are ready to schedule their initial orientation appointment.

## 2021-07-01 ENCOUNTER — Encounter: Admit: 2021-07-01 | Discharge: 2021-07-01 | Payer: Medicare Other

## 2021-07-01 ENCOUNTER — Ambulatory Visit: Admit: 2021-07-01 | Discharge: 2021-07-01 | Payer: Medicare Other

## 2021-07-01 ENCOUNTER — Ambulatory Visit: Admit: 2021-07-01 | Discharge: 2021-07-02 | Payer: Medicare Other

## 2021-07-01 VITALS — BP 133/79 | HR 67

## 2021-07-01 VITALS — BP 143/77 | HR 60 | Temp 97.90000°F | Resp 20 | Ht 70.984 in | Wt 241.2 lb

## 2021-07-01 DIAGNOSIS — N183 CKD (chronic kidney disease) stage 3, GFR 30-59 ml/min (HCC): Secondary | ICD-10-CM

## 2021-07-01 DIAGNOSIS — I739 Peripheral vascular disease, unspecified: Secondary | ICD-10-CM

## 2021-07-01 DIAGNOSIS — E785 Hyperlipidemia, unspecified: Secondary | ICD-10-CM

## 2021-07-01 DIAGNOSIS — J449 Chronic obstructive pulmonary disease, unspecified: Secondary | ICD-10-CM

## 2021-07-01 DIAGNOSIS — N1832 Chronic kidney disease, stage 3b (HCC): Secondary | ICD-10-CM

## 2021-07-01 DIAGNOSIS — E119 Type 2 diabetes mellitus without complications: Secondary | ICD-10-CM

## 2021-07-01 DIAGNOSIS — N189 Chronic kidney disease, unspecified: Secondary | ICD-10-CM

## 2021-07-01 DIAGNOSIS — Z720 Tobacco use: Secondary | ICD-10-CM

## 2021-07-01 DIAGNOSIS — C61 Malignant neoplasm of prostate: Secondary | ICD-10-CM

## 2021-07-01 DIAGNOSIS — I10 Essential (primary) hypertension: Secondary | ICD-10-CM

## 2021-07-01 DIAGNOSIS — I2510 Atherosclerotic heart disease of native coronary artery without angina pectoris: Secondary | ICD-10-CM

## 2021-07-01 DIAGNOSIS — G4733 Obstructive sleep apnea (adult) (pediatric): Secondary | ICD-10-CM

## 2021-07-01 LAB — CBC
HEMATOCRIT: 45 % (ref 40–50)
HEMOGLOBIN: 15 g/dL (ref 13.5–16.5)
MCH: 28 pg — ABNORMAL HIGH (ref 26–34)
MCHC: 33 g/dL (ref 32.0–36.0)
MCV: 85 FL (ref 80–100)
MPV: 8.2 FL (ref 7–11)
PLATELET COUNT: 234 K/UL (ref 150–400)
RBC COUNT: 5.3 M/UL (ref 4.4–5.5)
RDW: 15 % — ABNORMAL HIGH (ref >60)
WBC COUNT: 7 K/UL (ref 4.5–11.0)

## 2021-07-01 LAB — PARATHYROID HORMONE: PTH HORMONE: 150 pg/mL — ABNORMAL HIGH (ref 10–65)

## 2021-07-01 LAB — PHOSPHORUS: PHOSPHORUS: 2.8 mg/dL (ref 2.0–4.5)

## 2021-07-01 LAB — BASIC METABOLIC PANEL
POTASSIUM: 4.1 MMOL/L (ref 3.5–5.1)
SODIUM: 142 MMOL/L — ABNORMAL LOW (ref 137–147)

## 2021-07-01 LAB — IRON + BINDING CAPACITY + %SAT+ FERRITIN
IRON BINDING: 298 ug/dL (ref 270–380)
IRON: 49 ug/dL — ABNORMAL LOW (ref 50–185)

## 2021-07-01 NOTE — Progress Notes
History        CC: Evaluation of chronic kidney disease.      HPI: Sean Henderson is a 66 y.o. male  past medical history of obstructive sleep apnea, DM, COPD, hypertension, CAD, heart failure, hx of prostate cancer s/p radiation therapy and CKD.He was evaluated in 2013 by Dr. Rocco Serene, for cystic kidney disease.  This was diagnosed as acquired cystic kidney disease.  He was again seen in 2015 with similar complaints and the imaging findings were reassuring.    I first saw the patient on 12/31/2020 and at that time he stated that his PMD repeated his BMP and he was told that his serum creatinine was higher than previously. He also completed a renal USG about a month ago.  He denied abdominal discomfort/pain.   Denies hematuria and dysuria. He reported increased urinary frequency, nocturia and urgency which he attributed to his prior radiation therapy. He denied straining when urinating.  Home BP ranged up to  120/70- 190/42mmHg. He reported that he is compliant with his antihypertensives. He denied taking NSAIDS.     Interval history:Poor sleep and tiredness. He has episodes of hypoglycemia, down to 39g/dl  Home BP 161-096/04'V to        Past Medical History   Medical History:   Diagnosis Date   ? CAD (coronary artery disease), native coronary artery 12/09/2007    A.2/04 Normal coronaries [facility] EF 15%  B. 2/07- Chest pain, admit [place]: Bare metal stent to 90% mLAD, EF 45% C. 12/09/07 Angina, Cath [place]: BareMetalStent for 80% mLAD, Kissing balloon 80% Dx2  D. 06/17/08- dobutamine stress echo:  LV 5.2. EF 60%. No ischemia.  E. 08/25/09- Dobutamine stress echo: Mild concentric LVH. EF 60%. LA is mildly enlarged. No ischemia. F.  12/01/10 - Dobut   echo:  Terminated w/ HT   ? Cardiomyopathy    ? CKD (chronic kidney disease) stage 3, GFR 30-59 ml/min (HCC) 02/14/2017   ? COPD (chronic obstructive pulmonary disease) (HCC)    ? Coronary artery disease    ? DM (diabetes mellitus) (HCC)    ? HLD (hyperlipidemia)    ? Hypertension    ? OSA (obstructive sleep apnea)    ? PAD (peripheral artery disease) (HCC)     LLE, s/p 3 stents (last one on 08/2016 at [facility])   ? Prostate cancer (HCC) 2017    s/p radiation (last treatment late 2018. No surgery nor chemo   ? Tobacco abuse         Family History  Family History   Problem Relation Age of Onset   ? Heart Attack Mother    ? Hypertension Mother    ? Heart Attack Father    ? Hypertension Father    ? Heart Attack Sister    ? Hypertension Sister    ? Heart Attack Brother    ? Hypertension Brother         Social History  Social History     Socioeconomic History   ? Marital status: Separated   ? Number of children: 7   Occupational History   ? Occupation: [employer] DRIVER     Employer: DISABLED   Tobacco Use   ? Smoking status: Some Days     Packs/day: 5.00     Years: 48.00     Pack years: 240.00     Types: Cigarettes     Start date: 04/24/1972   ? Smokeless tobacco: Never   ? Tobacco comments:  Patient states he had somked up to 5 ppd at his peak    Substance and Sexual Activity   ? Alcohol use: Not Currently     Comment: 3 times yearly    ? Drug use: Yes     Frequency: 2.0 times per week     Types: Marijuana     Comment: Smokes marijuana 1-2x/week at bedtime.Quit cocaine 1991        Medication    HOME MEDS  ? albuterol 0.083% (PROVENTIL) 2.5 mg /3 mL (0.083 %) nebulizer solution Inhale 3 mL solution by nebulizer as directed every 4 hours.   ? albuterol sulfate (PROAIR HFA) 90 mcg/actuation HFA aerosol inhaler Inhale one puff to two puffs by mouth into the lungs every 4 hours as needed for Wheezing (or shortness of breath).   ? amLODIPine (NORVASC) 10 mg tablet Take one tablet by mouth daily.   ? ascorbic acid/multivit-min (EMERGEN-C PO) Take 1 tablet by mouth daily.   ? aspirin EC 81 mg tablet Take 1 Tab by mouth daily.   ? atorvastatin (LIPITOR) 40 mg tablet Take one tablet by mouth daily.   ? azelastine (ASTELIN) 137 mcg (0.1 %) nasal spray APPLY TWO SPRAYS TO EACH NOSTRIL AS DIRECTED TWICE DAILY. USE IN EACH NOSTRIL AS DIRECTED   ? budesonide-formoterol (SYMBICORT HFA) 160-4.5 mcg/actuation inhalation Inhale two puffs by mouth into the lungs twice daily.   ? budesonide-glycopyr-formoterol (BREZTRI AEROSPHERE) 160-9-4.8 mcg/actuation inhaler Inhale two puffs by mouth into the lungs twice daily.   ? carvedilol (COREG) 25 mg tablet Take one tablet by mouth twice daily with meals. Take with food.   ? clopiDOGrel (PLAVIX) 75 mg tablet Take one tablet by mouth daily. Indications: For peripheral stents - managed and to be filled by [facility] physician   ? ENTRESTO 97-103 mg tablet TAKE 1 TABLET BY MOUTH TWICE A DAY   ? ezetimibe (ZETIA) 10 mg tablet Take one tablet by mouth daily.   ? furosemide (LASIX) 20 mg tablet Take one tablet by mouth every morning.   ? gabapentin (NEURONTIN) 100 mg PO capsule Take 1 Cap by mouth three times daily.   ? hydrALAZINE (APRESOLINE) 100 mg tablet Take one tablet by mouth three times daily.   ? HYDROcodone/acetaminophen (NORCO) 5/325 mg tablet Take one tablet by mouth twice daily as needed for Pain.   ? isosorbide mononitrate ER (IMDUR) 30 mg tablet TAKE 1 TABLET BY MOUTH EVERY MORNING   ? JARDIANCE 10 mg tablet TAKE 1 TABLET BY MOUTH DAILY   ? multivit-min/folic/vit K/lycop (MEN'S 50 PLUS DAILY FORMULA PO) Take 1 tablet by mouth daily.   ? mupirocin (CENTANY) 2 % topical ointment Apply  topically to affected area three times daily. Apply to the front of the nose as needed up to three times per day   ? Nebulizer Accessories kit Please dispense one nebulizer kit   ? nicotine (NICODERM CQ) 21 mg/day patch Apply one patch to top of skin as directed every 24 hours. Rotate patch location.  Indications: Stop Smoking   ? nicotine(+) (NICOTROL) 10 mg inhaler Inhale one puff by mouth into the lungs as Needed. Puff by mouth as needed. May use 6-16 cartridges per day as needed for up to 6 months.   ? nitroglycerin (NITROSTAT) 0.4 mg tablet Place one tab under tongue every 5 mins not to exceed 3 tabs;as needed for chest pain. If chest pain persists go to the ER or call 911.   ? spironolactone (ALDACTONE) 50 mg  tablet Take one tablet by mouth daily. Take with food.   ? tiotropium bromide (SPIRIVA RESPIMAT) 2.5 mcg/actuation inhaler Inhale two puffs by mouth into the lungs daily.   ? traZODone (DESYREL) 100 mg tablet Take two tablets by mouth at bedtime as needed.          Review of Systems  Constitutional: negative  Eyes: negative  Ears, nose, mouth, throat, and face: negative  Respiratory: negative  Cardiovascular: negative  Gastrointestinal: negative  Genitourinary:negative  Integument/breast: negative  Hematologic/lymphatic: negative  Musculoskeletal:negative  Neurological: negative  Endocrine: negative      Physical Exam        Vitals:    07/01/21 0853 07/01/21 0857   BP: (!) 143/77 133/79   BP Source: Arm, Left Upper Arm, Left Upper   Pulse: 60 67   Temp: 36.6 ?C (97.9 ?F)    Resp: 20    SpO2: 98%    TempSrc: Oral    PainSc: Six    Weight: 109.4 kg (241 lb 3.2 oz)    Height: 180.3 cm (5' 10.98)      Body mass index is 33.66 kg/m?Marland Kitchen  Repeat BP: 140/74mmhg    Gen: Alert and Oriented, No Acute Distress   HEENT: Sclera normal; MMM  CV:  S1 and S2 normal, no rubs, murmurs or gallops   Pulm: Clear to Auscultation bilateral   GI: BS+ x4, non-tender to palpation  Neuro: Grossly normal, moving all extremities, speech intact  Ext: no edema, clubbing or cyanosis   Skin: no rash     Assessment and Plan        Sean Henderson is a 66 y.o. male     -  Chronic kidney disease 3B  Patient has baseline CKD 2 to CKD 3A.  Serum creatinine and EGFR have been stable over the past couple of years with largely bland urinalysis.  He has history of hypertension diabetes as well as acquired cystic kidney disease.   He has subnephrotic range proteinuria.  Follow up BMP. Genetic testing.  ?  -   Cystic kidney disease  This is likely acquired cystic disease; Will request genetic testing.   The features on ultrasound have been largely stable over the years with internal features attributed to prior hemorrhage.  We will repeat renal ultrasound today  I emphasized the importance of blood pressure control.  ?  -.  Heart failure  His last echocardiogram on 07/07/2019 showed estimated LVEF of about 50%.  He was started on empagliflozin in April 2022.  ?  -.  Coronary artery disease  Patient has had multiple stenting of his LAD in 2007, 2009, and 2018.  Follow up with cardiology.  ?  -.  Obstructive sleep apnea  Patient is on CPAP but has not been using it consistently.  He is concerned that he may have the wrong settings on his CPAP.  Referral to sleep medicine.    ?  -.  Essential hypertension  The patient's blood pressure control is highly variable at home.  We discussed monitor trend his blood pressure the same time taking his medication consistently.  He will do this for about a week and send me the measurements in my chart.  At that point we can decide whether or not to adjust his medication.  We also discussed low-salt diet.    --Diabetes mellitus:  Unclear reason for hypoglycemia.   ?Patient will follow up with his primary doctor.  ?  Doran Durand, MD  Doran Durand, MD      Patient Instructions   Use your CPAP machine as instructed every night.  I have placed a referral for you to see a sleep specialist.    The following are some measures which may help to slow down the progression of kidney disease:    Avoid medications known as  NSAIDs which  include Advil, Motrin,Aleve, ibuprofen, naproxen.    Maintain adequate fluid intake.    Acute illnesses like the flu and COVID which may end up in hospitalization may impact on  kidney function and may cause the kidneys to fail.    It is important to keep a up with your regular vaccinations for these diseases and for the pneumonia vaccine as well.    If you develop diarrhea or vomiting and unable to keep up with your fluid intake, please report to the nearest ED since it may affect blood pressure and affect the blood flow to the kidney    Some medications used in patients with kidney disease including diuretics [water pill], some blood pressure medications [ACE inhibitors and ARB] like lisinopril, losartan may need to be held if you develop severe diarrhea or vomiting.  Under these conditions these medications may worsen kidney function.    Visit the following web site for information on diet in patients with kidney disease:  https://[email or web address].org/nutrition                 Follow up in 4months

## 2021-07-02 ENCOUNTER — Encounter: Admit: 2021-07-02 | Discharge: 2021-07-02 | Payer: Medicare Other

## 2021-07-02 DIAGNOSIS — G4733 Obstructive sleep apnea (adult) (pediatric): Secondary | ICD-10-CM

## 2021-07-02 DIAGNOSIS — N189 Chronic kidney disease, unspecified: Principal | ICD-10-CM

## 2021-07-02 DIAGNOSIS — N1832 Chronic kidney disease, stage 3b (HCC): Secondary | ICD-10-CM

## 2021-07-02 MED ORDER — ERGOCALCIFEROL (VITAMIN D2) 1,250 MCG (50,000 UNIT) PO CAP
1 | ORAL_CAPSULE | ORAL | 0 refills | 56.00000 days | Status: AC
Start: 2021-07-02 — End: ?

## 2021-07-04 ENCOUNTER — Encounter: Admit: 2021-07-04 | Discharge: 2021-07-04 | Payer: Medicare Other

## 2021-07-04 NOTE — Telephone Encounter
Notified pt of all information noted below. Pt expressed understanding with no questions or concerns. Pt also aware of genetic testing from [facility] and that they will contact him with more information for billing and setting up specimen collection.   Order submitted via [employer], [place] # [id number].

## 2021-07-04 NOTE — Telephone Encounter
-----   Message from Julious Payer, MD sent at 07/02/2021  8:13 PM CST -----  Smitty Pluck,  Please inform Mr. Poland that I have sent a prescription for ergocalciferol 50000units weekly for 12 weeks.  Thank you.    Julious Payer, MD    ----- Message -----  From: Carlos American, RN  Sent: 07/01/2021   9:32 AM CST  To: Julious Payer, MD

## 2021-07-26 ENCOUNTER — Encounter: Admit: 2021-07-26 | Discharge: 2021-07-26 | Payer: Medicare Other

## 2021-07-26 MED ORDER — ERGOCALCIFEROL (VITAMIN D2) 1,250 MCG (50,000 UNIT) PO CAP
ORAL_CAPSULE | 1 refills
Start: 2021-07-26 — End: ?

## 2021-08-08 ENCOUNTER — Encounter: Admit: 2021-08-08 | Discharge: 2021-08-08 | Payer: Medicare Other

## 2021-08-15 ENCOUNTER — Encounter: Admit: 2021-08-15 | Discharge: 2021-08-15 | Payer: Medicare Other

## 2021-08-15 NOTE — Telephone Encounter
I called the patient to discuss his recent genetic testing .No genetic causes of cystic disease found.  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  Will refer patient for genetic counseling by Johnsie Cancel.    Julious Payer, MD

## 2021-08-16 ENCOUNTER — Encounter: Admit: 2021-08-16 | Discharge: 2021-08-16 | Payer: Medicare Other

## 2021-08-16 NOTE — Telephone Encounter
Provided pt with number to [facility] to call and schedule an appt to talk with a genetic counselor regarding his results.

## 2021-08-16 NOTE — Telephone Encounter
-----   Message from Julious Payer, MD sent at 08/15/2021  3:43 PM CDT -----  Regarding: Genetic counseling  Hello Jarrett Soho,  Are you able to reach out to [facility] to offer Mr. Holzmann genetic counselling?    Thank you.    ----- Message -----  From: Syliva Overman, LPN  Sent: 09/26/5407   9:06 AM CDT  To: Julious Payer, MD

## 2021-08-18 ENCOUNTER — Encounter: Admit: 2021-08-18 | Discharge: 2021-08-18 | Payer: Medicare Other

## 2021-08-22 ENCOUNTER — Encounter: Admit: 2021-08-22 | Discharge: 2021-08-22 | Payer: Medicare Other

## 2021-08-22 ENCOUNTER — Ambulatory Visit: Admit: 2021-08-22 | Discharge: 2021-08-22 | Payer: Medicare Other

## 2021-08-22 DIAGNOSIS — E119 Type 2 diabetes mellitus without complications: Secondary | ICD-10-CM

## 2021-08-22 DIAGNOSIS — E7800 Pure hypercholesterolemia, unspecified: Secondary | ICD-10-CM

## 2021-08-22 DIAGNOSIS — I739 Peripheral vascular disease, unspecified: Secondary | ICD-10-CM

## 2021-08-22 DIAGNOSIS — N1832 Stage 3b chronic kidney disease (HCC): Secondary | ICD-10-CM

## 2021-08-22 DIAGNOSIS — I428 Other cardiomyopathies: Secondary | ICD-10-CM

## 2021-08-22 DIAGNOSIS — R0602 Shortness of breath: Secondary | ICD-10-CM

## 2021-08-22 DIAGNOSIS — E785 Hyperlipidemia, unspecified: Secondary | ICD-10-CM

## 2021-08-22 DIAGNOSIS — C61 Malignant neoplasm of prostate: Secondary | ICD-10-CM

## 2021-08-22 DIAGNOSIS — J449 Chronic obstructive pulmonary disease, unspecified: Secondary | ICD-10-CM

## 2021-08-22 DIAGNOSIS — I10 Essential (primary) hypertension: Secondary | ICD-10-CM

## 2021-08-22 DIAGNOSIS — Z720 Tobacco use: Secondary | ICD-10-CM

## 2021-08-22 DIAGNOSIS — I2510 Atherosclerotic heart disease of native coronary artery without angina pectoris: Secondary | ICD-10-CM

## 2021-08-22 DIAGNOSIS — N183 CKD (chronic kidney disease) stage 3, GFR 30-59 ml/min (HCC): Secondary | ICD-10-CM

## 2021-08-22 DIAGNOSIS — Z136 Encounter for screening for cardiovascular disorders: Secondary | ICD-10-CM

## 2021-08-22 DIAGNOSIS — G4733 Obstructive sleep apnea (adult) (pediatric): Secondary | ICD-10-CM

## 2021-08-22 DIAGNOSIS — I5042 Chronic combined systolic (congestive) and diastolic (congestive) heart failure: Secondary | ICD-10-CM

## 2021-08-22 LAB — BASIC METABOLIC PANEL
ANION GAP: 9 (ref 3–12)
BLD UREA NITROGEN: 22 mg/dL (ref 7–25)
CALCIUM: 9.7 mg/dL (ref 8.5–10.6)
CO2: 29 MMOL/L (ref 21–30)
CREATININE: 1.9 mg/dL — ABNORMAL HIGH (ref 0.4–1.24)
EGFR: 37 mL/min — ABNORMAL LOW (ref >60)
GLUCOSE,PANEL: 102 mg/dL — ABNORMAL HIGH (ref 70–100)
POTASSIUM: 3.8 MMOL/L (ref 3.5–5.1)
SODIUM: 144 MMOL/L (ref 137–147)

## 2021-08-22 LAB — MAGNESIUM: MAGNESIUM: 2 mg/dL (ref 1.6–2.6)

## 2021-08-22 LAB — BNP (B-TYPE NATRIURETIC PEPTI): BNP: 154 pg/mL — ABNORMAL HIGH (ref 0–100)

## 2021-08-22 MED ORDER — CLONIDINE HCL 0.1 MG PO TAB
.1 mg | ORAL_TABLET | Freq: Two times a day (BID) | ORAL | 3 refills | Status: AC
Start: 2021-08-22 — End: ?

## 2021-08-23 ENCOUNTER — Encounter: Admit: 2021-08-23 | Discharge: 2021-08-23 | Payer: Medicare Other

## 2021-08-23 DIAGNOSIS — N189 Chronic kidney disease, unspecified: Secondary | ICD-10-CM

## 2021-08-23 MED ORDER — AMLODIPINE 10 MG PO TAB
ORAL_TABLET | 1 refills | Status: AC
Start: 2021-08-23 — End: ?

## 2021-08-23 MED ORDER — ENTRESTO 97-103 MG PO TAB
ORAL_TABLET | 3 refills | Status: AC
Start: 2021-08-23 — End: ?

## 2021-08-23 NOTE — Telephone Encounter
Rec'd refill request for amlodipine. Refilled per protocol.

## 2021-08-23 NOTE — Telephone Encounter
I called and spoke to the patient regarding his recent labs which show progressively worsening sCr. He denies any ongoing symptoms and he has not started on any new medications. I have asked him to complete a renal ultrasound and follow up with me in the office.  Julious Payer, MD

## 2021-09-12 ENCOUNTER — Encounter: Admit: 2021-09-12 | Discharge: 2021-09-12 | Payer: Medicare Other

## 2021-09-12 ENCOUNTER — Ambulatory Visit: Admit: 2021-09-12 | Discharge: 2021-09-12 | Payer: Medicare Other

## 2021-09-12 DIAGNOSIS — I10 Essential (primary) hypertension: Secondary | ICD-10-CM

## 2021-09-12 DIAGNOSIS — J310 Chronic rhinitis: Secondary | ICD-10-CM

## 2021-09-12 DIAGNOSIS — E119 Type 2 diabetes mellitus without complications: Secondary | ICD-10-CM

## 2021-09-12 DIAGNOSIS — I739 Peripheral vascular disease, unspecified: Secondary | ICD-10-CM

## 2021-09-12 DIAGNOSIS — I2510 Atherosclerotic heart disease of native coronary artery without angina pectoris: Secondary | ICD-10-CM

## 2021-09-12 DIAGNOSIS — N183 CKD (chronic kidney disease) stage 3, GFR 30-59 ml/min (HCC): Secondary | ICD-10-CM

## 2021-09-12 DIAGNOSIS — R0982 Postnasal drip: Secondary | ICD-10-CM

## 2021-09-12 DIAGNOSIS — C61 Malignant neoplasm of prostate: Secondary | ICD-10-CM

## 2021-09-12 DIAGNOSIS — E785 Hyperlipidemia, unspecified: Secondary | ICD-10-CM

## 2021-09-12 DIAGNOSIS — J449 Chronic obstructive pulmonary disease, unspecified: Secondary | ICD-10-CM

## 2021-09-12 DIAGNOSIS — G4733 Obstructive sleep apnea (adult) (pediatric): Secondary | ICD-10-CM

## 2021-09-12 DIAGNOSIS — Z720 Tobacco use: Secondary | ICD-10-CM

## 2021-09-12 DIAGNOSIS — R442 Other hallucinations: Secondary | ICD-10-CM

## 2021-09-12 NOTE — Progress Notes
Date of Service: 09/12/2021    Subjective:             Sean Henderson is a 66 y.o. male.    History of Present Illness    Last seen 05/19/20 for nasal obstruction, rhinitis sicca, pnd  Started on azelastine spray and mupirocin    States intermittent smell in the nose  States smells like flowers or something but not quite good  Happened after having a bad cold about 3 months ago  Others cannot smell it  Otherwise can smell good/bad smells normally    No pain in the nose  Pain in the shoulders/back    States occasional blurry vision  No nose bleeding  No wt loss  Sometimes feels difficulty swallowing and cough due to mucous    2019 may had trouble breathing, swallowing, and it was found to be heart attach and heart failure    Has been using saline sprays, azelastine, flonase       Review of Systems   Constitutional: Negative.    HENT: Negative.    Eyes: Negative.    Respiratory: Negative.    Cardiovascular: Negative.    Gastrointestinal: Negative.    Endocrine: Negative.    Genitourinary: Negative.    Musculoskeletal: Positive for back pain and neck stiffness.   Skin: Negative.    Neurological: Negative.    Hematological: Negative.    Psychiatric/Behavioral: Negative.          Objective:         ? albuterol 0.083% (PROVENTIL) 2.5 mg /3 mL (0.083 %) nebulizer solution Inhale 3 mL solution by nebulizer as directed every 4 hours.   ? albuterol sulfate (PROAIR HFA) 90 mcg/actuation HFA aerosol inhaler Inhale one puff to two puffs by mouth into the lungs every 4 hours as needed for Wheezing (or shortness of breath).   ? amLODIPine (NORVASC) 10 mg tablet TAKE 1 TABLET BY MOUTH DAILY   ? ascorbic acid/multivit-min (EMERGEN-C PO) Take 1 tablet by mouth daily.   ? aspirin EC 81 mg tablet Take 1 Tab by mouth daily.   ? atorvastatin (LIPITOR) 40 mg tablet Take one tablet by mouth daily.   ? azelastine (ASTELIN) 137 mcg (0.1 %) nasal spray APPLY TWO SPRAYS TO EACH NOSTRIL AS DIRECTED TWICE DAILY. USE IN EACH NOSTRIL AS DIRECTED   ? budesonide-formoterol (SYMBICORT HFA) 160-4.5 mcg/actuation inhalation Inhale two puffs by mouth into the lungs twice daily.   ? budesonide-glycopyr-formoterol (BREZTRI AEROSPHERE) 160-9-4.8 mcg/actuation inhaler Inhale two puffs by mouth into the lungs twice daily.   ? carvedilol (COREG) 25 mg tablet Take one tablet by mouth twice daily with meals. Take with food.   ? cloNIDine HCL (CATAPRES) 0.1 mg tablet Take one tablet by mouth twice daily. Start with 1 pill at bedtime and advance after 10 days to 1 twice daily   ? clopiDOGrel (PLAVIX) 75 mg tablet Take one tablet by mouth daily. Indications: For peripheral stents - managed and to be filled by [facility] physician   ? ENTRESTO 97-103 mg tablet TAKE 1 TABLET BY MOUTH TWICE A DAY   ? ERGOcalciferoL (vitamin D2) (DRISDOL) 1,250 mcg (50,000 unit) capsule Take one capsule by mouth every 7 days.   ? ezetimibe (ZETIA) 10 mg tablet Take one tablet by mouth daily.   ? furosemide (LASIX) 20 mg tablet Take one tablet by mouth every morning.   ? gabapentin (NEURONTIN) 100 mg PO capsule Take 1 Cap by mouth three times daily.   ?  hydrALAZINE (APRESOLINE) 100 mg tablet Take one tablet by mouth three times daily.   ? HYDROcodone/acetaminophen (NORCO) 5/325 mg tablet Take one tablet by mouth twice daily as needed for Pain.   ? isosorbide mononitrate ER (IMDUR) 30 mg tablet TAKE 1 TABLET BY MOUTH EVERY MORNING   ? JARDIANCE 10 mg tablet TAKE 1 TABLET BY MOUTH DAILY   ? multivit-min/folic/vit K/lycop (MEN'S 50 PLUS DAILY FORMULA PO) Take 1 tablet by mouth daily.   ? mupirocin (CENTANY) 2 % topical ointment Apply  topically to affected area three times daily. Apply to the front of the nose as needed up to three times per day   ? Nebulizer Accessories kit Please dispense one nebulizer kit   ? nicotine (NICODERM CQ) 21 mg/day patch Apply one patch to top of skin as directed every 24 hours. Rotate patch location.  Indications: Stop Smoking   ? nicotine(+) (NICOTROL) 10 mg inhaler Inhale one puff by mouth into the lungs as Needed. Puff by mouth as needed. May use 6-16 cartridges per day as needed for up to 6 months.   ? nitroglycerin (NITROSTAT) 0.4 mg tablet Place one tab under tongue every 5 mins not to exceed 3 tabs;as needed for chest pain. If chest pain persists go to the ER or call 911.   ? spironolactone (ALDACTONE) 50 mg tablet Take one tablet by mouth daily. Take with food.   ? tiotropium bromide (SPIRIVA RESPIMAT) 2.5 mcg/actuation inhaler Inhale two puffs by mouth into the lungs daily.   ? tralokinumab-ldrm (ADBRY) 150 mg/mL injection syringe Inject 2 mL under the skin every 7 days. Mondays   ? traZODone (DESYREL) 100 mg tablet Take two tablets by mouth at bedtime as needed.     Vitals:    09/12/21 1111   BP: (!) 173/92   Pulse: 74   PainSc: Eight   Weight: 108 kg (238 lb)   Height: 180.3 cm (5' 11)     Body mass index is 33.19 kg/m?Marland Kitchen     Physical Exam    General:  Well-developed, well-nourished   Inspection:  Normocephalic and atraumatic without mass or lesion  Palpation:  Facial skeleton intact without bony stepoffs  Facial Strength:  Facial motility symmetric and full bilaterally  Tympanic Membrane:  Normal bilaterally  External nose:  No scar or anatomic deformity  Internal Nose:  Septum intact with mild S-shaped septal deviation with spur.  No edema, polyp, or rhinorrhea.  Oral cavity, Lips, Teeth, and Gums:  Mucosa and teeth intact and viable, No lesions, masses or ulcers  Oropharynx: No erythema or exudate, no masses or ulcerations, non-obstructive tonsils  Larynx:  Normal voice, no stridor or stertor.    Neck, Trachea, Lymphatics:  Midline trachea without mass or lesion, no lymphadenopathy  Eyes: No nystagmus with equal extraocular motion bilaterally  Cranial nerves I-XII are intact      Nasal Endoscopic Examination (Separate Procedure):    Indications: Anterior Rhinoscopy Insufficient to Account for Symptoms    Procedure in detail:   Patient is anesthetized with 4% lidocaine and decongested with neosynephrine.  After obtaining verbal consent, a rigid 30 degree nasal endoscope was inserted into the bilateral nares to inspect the bilateral sinonasal cavities.  The following findings were noted:    Nasal septum:  The septum is deviated with spur    Right Sinonasal Cavity: There is no evidence of prior surgery.  The mucosa is not inflamed and there is no rhinorrhea present. The inferior turbinate is normal.  The inferior meatus is normal.  The middle turbinate is normal.  The middle meatus is normal with no drainage.  The superior turbinate is normal.  The sphenoethmoidal recess and superior meatus is normal with no drainage.  There are no visualized masses or polyps.  cannot visualize olfactory cleft on the right due to anatomy of patient    Left Sinonasal Cavity: There is no evidence of prior surgery.  The mucosa is not inflamed and there is no rhinorrhea present. The inferior turbinate is normal.  The inferior meatus is normal.  The middle turbinate is normal.  The middle meatus is normal with no drainage.  The superior turbinate is normal.  The sphenoethmoidal recess and superior meatus is normal with no drainage.  There are no visualized masses or polyps.      Naspharynx:  The nasopharynx is normal in appearance.     Cultures are not obtained today    The patient tolerated the procedure well without complications.                 Assessment and Plan:  Encounter Diagnoses   Name Primary?   ? Rhinitis sicca Yes   ? PND (post-nasal drip)    ? Phantosmia      No pathology on scope exam today but cannot visualize olfactory cleft on the right due to anatomy of patient  Discussed options today  Will get CT sinus to evaluate  Discussed stopping the nasal sprays for now  When restarting, recommend getting new Rx in case the smell is due to medication/sprays

## 2021-09-16 ENCOUNTER — Encounter: Admit: 2021-09-16 | Discharge: 2021-09-16 | Payer: Medicare Other

## 2021-09-16 ENCOUNTER — Ambulatory Visit: Admit: 2021-09-16 | Discharge: 2021-09-16 | Payer: Medicare Other

## 2021-09-16 DIAGNOSIS — R442 Other hallucinations: Secondary | ICD-10-CM

## 2021-09-22 ENCOUNTER — Encounter: Admit: 2021-09-22 | Discharge: 2021-09-22 | Payer: Medicare Other

## 2021-09-23 ENCOUNTER — Encounter: Admit: 2021-09-23 | Discharge: 2021-09-23 | Payer: Medicare Other

## 2021-09-23 MED ORDER — JARDIANCE 10 MG PO TAB
ORAL_TABLET | 3 refills | Status: AC
Start: 2021-09-23 — End: ?

## 2021-09-28 ENCOUNTER — Encounter: Admit: 2021-09-28 | Discharge: 2021-09-28 | Payer: Medicare Other

## 2021-09-28 ENCOUNTER — Ambulatory Visit: Admit: 2021-09-28 | Discharge: 2021-09-29 | Payer: Medicare Other

## 2021-09-28 DIAGNOSIS — I739 Peripheral vascular disease, unspecified: Secondary | ICD-10-CM

## 2021-09-28 DIAGNOSIS — C61 Malignant neoplasm of prostate: Secondary | ICD-10-CM

## 2021-09-28 DIAGNOSIS — E785 Hyperlipidemia, unspecified: Secondary | ICD-10-CM

## 2021-09-28 DIAGNOSIS — Z720 Tobacco use: Secondary | ICD-10-CM

## 2021-09-28 DIAGNOSIS — I2510 Atherosclerotic heart disease of native coronary artery without angina pectoris: Secondary | ICD-10-CM

## 2021-09-28 DIAGNOSIS — G4733 Obstructive sleep apnea (adult) (pediatric): Secondary | ICD-10-CM

## 2021-09-28 DIAGNOSIS — N183 CKD (chronic kidney disease) stage 3, GFR 30-59 ml/min (HCC): Secondary | ICD-10-CM

## 2021-09-28 DIAGNOSIS — I10 Essential (primary) hypertension: Secondary | ICD-10-CM

## 2021-09-28 DIAGNOSIS — E119 Type 2 diabetes mellitus without complications: Secondary | ICD-10-CM

## 2021-09-28 DIAGNOSIS — J449 Chronic obstructive pulmonary disease, unspecified: Secondary | ICD-10-CM

## 2021-09-28 MED ORDER — BUPROPION XL 150 MG PO TB24
ORAL_TABLET | ORAL | 3 refills | Status: AC
Start: 2021-09-28 — End: ?

## 2021-09-28 MED ORDER — NICOTINE (POLACRILEX) 2 MG BU GUM
2 mg | ORAL | 3 refills | Status: AC | PRN
Start: 2021-09-28 — End: ?

## 2021-09-28 NOTE — Patient Instructions
It was a pleasure seeing you today.      My nurse is Estanislado Spire, Therapist, sports.  She can be reached at [phone].    Please contact my nurse with any signs and symptoms of worsening productive cough with thick secretions, blood in sputum, chest pain or tightness, shortness of breath, fever, chills, night sweats, or any questions or concerns.    For refills on medications, please have your pharmacy request a refill authorization either electronically or via fax to our office at [phone]. Please allow at least 3 business days for refill requests.    For urgent issues after business hours, weekends, or holidays please call [phone] and request for the pulmonary fellow to be paged.    If you need to cancel or reschedule an appointment, please call [phone].    - We're starting Wellbutrin. 150 mg for 3 days then 300 mg from then on  - Please take Breztri two puffs twice daily  - I will talk to my nurse about the spacer and the nebulizer

## 2021-09-29 ENCOUNTER — Encounter: Admit: 2021-09-29 | Discharge: 2021-09-29 | Payer: Medicare Other

## 2021-09-29 ENCOUNTER — Ambulatory Visit: Admit: 2021-09-29 | Discharge: 2021-09-29 | Payer: Medicare Other

## 2021-09-29 DIAGNOSIS — G4733 Obstructive sleep apnea (adult) (pediatric): Secondary | ICD-10-CM

## 2021-09-29 DIAGNOSIS — C61 Malignant neoplasm of prostate: Secondary | ICD-10-CM

## 2021-09-29 DIAGNOSIS — N183 Stage 3 chronic kidney disease, unspecified whether stage 3a or 3b CKD (HCC): Secondary | ICD-10-CM

## 2021-09-29 DIAGNOSIS — J449 Chronic obstructive pulmonary disease, unspecified: Secondary | ICD-10-CM

## 2021-09-29 DIAGNOSIS — I739 Peripheral vascular disease, unspecified: Secondary | ICD-10-CM

## 2021-09-29 DIAGNOSIS — Z720 Tobacco use: Secondary | ICD-10-CM

## 2021-09-29 DIAGNOSIS — E119 Type 2 diabetes mellitus without complications: Secondary | ICD-10-CM

## 2021-09-29 DIAGNOSIS — I10 Essential (primary) hypertension: Secondary | ICD-10-CM

## 2021-09-29 DIAGNOSIS — E785 Hyperlipidemia, unspecified: Secondary | ICD-10-CM

## 2021-09-29 DIAGNOSIS — I2510 Atherosclerotic heart disease of native coronary artery without angina pectoris: Secondary | ICD-10-CM

## 2021-09-30 ENCOUNTER — Encounter: Admit: 2021-09-30 | Discharge: 2021-09-30 | Payer: Medicare Other

## 2021-09-30 ENCOUNTER — Ambulatory Visit: Admit: 2021-09-30 | Discharge: 2021-09-30 | Payer: Medicare Other

## 2021-09-30 DIAGNOSIS — I739 Peripheral vascular disease, unspecified: Secondary | ICD-10-CM

## 2021-09-30 DIAGNOSIS — G4733 Obstructive sleep apnea (adult) (pediatric): Secondary | ICD-10-CM

## 2021-09-30 DIAGNOSIS — J449 Chronic obstructive pulmonary disease, unspecified: Secondary | ICD-10-CM

## 2021-09-30 DIAGNOSIS — C61 Malignant neoplasm of prostate: Secondary | ICD-10-CM

## 2021-09-30 DIAGNOSIS — E785 Hyperlipidemia, unspecified: Secondary | ICD-10-CM

## 2021-09-30 DIAGNOSIS — N183 Stage 3 chronic kidney disease, unspecified whether stage 3a or 3b CKD (HCC): Secondary | ICD-10-CM

## 2021-09-30 DIAGNOSIS — I10 Essential (primary) hypertension: Secondary | ICD-10-CM

## 2021-09-30 DIAGNOSIS — E119 Type 2 diabetes mellitus without complications: Secondary | ICD-10-CM

## 2021-09-30 DIAGNOSIS — Z720 Tobacco use: Secondary | ICD-10-CM

## 2021-09-30 DIAGNOSIS — I2510 Atherosclerotic heart disease of native coronary artery without angina pectoris: Secondary | ICD-10-CM

## 2021-09-30 LAB — BASIC METABOLIC PANEL
ANION GAP: 9 (ref 3–12)
BLD UREA NITROGEN: 30 mg/dL — ABNORMAL HIGH (ref 7–25)
CALCIUM: 9.2 mg/dL (ref 8.5–10.6)
CHLORIDE: 110 MMOL/L (ref 98–110)
CO2: 27 MMOL/L (ref 21–30)
CREATININE: 1.6 mg/dL — ABNORMAL HIGH (ref 0.4–1.24)
EGFR: 45 mL/min — ABNORMAL LOW (ref >60)
GLUCOSE,PANEL: 116 mg/dL — ABNORMAL HIGH (ref 70–100)
POTASSIUM: 4.1 MMOL/L (ref 3.5–5.1)
SODIUM: 146 MMOL/L (ref 137–147)

## 2021-09-30 LAB — URINALYSIS, MICROSCOPIC

## 2021-09-30 LAB — CBC
HEMATOCRIT: 49 % (ref 40–50)
HEMOGLOBIN: 15 g/dL — ABNORMAL LOW (ref 13.5–16.5)
MCHC: 31 g/dL — ABNORMAL LOW (ref 32.0–36.0)
MCV: 87 FL (ref 80–100)
PLATELET COUNT: 239 K/UL (ref 150–400)
RBC COUNT: 5.6 M/UL — ABNORMAL HIGH (ref 4.4–5.5)
RDW: 15 % — ABNORMAL HIGH (ref 11–15)
WBC COUNT: 8.8 K/UL (ref 4.5–11.0)

## 2021-09-30 LAB — PARATHYROID HORMONE: PTH HORMONE: 126 pg/mL — ABNORMAL HIGH (ref 10–65)

## 2021-09-30 LAB — PROTEIN/CR RATIO,UR RAN
PROT CREAT RAT/CAL: 0.7 /HPF — ABNORMAL HIGH (ref <0.15)
UR CREATININE, RAN: 138 mg/dL
UR TOTAL PROTEIN,RAN: 95 mg/dL

## 2021-09-30 LAB — IRON + BINDING CAPACITY + %SAT+ FERRITIN
% SATURATION: 30 % (ref 28–42)
IRON BINDING: 288 ug/dL (ref 270–380)
IRON: 85 ug/dL (ref 50–185)

## 2021-09-30 NOTE — Patient Instructions
Please monitor your blood pressure 2-3 times a week.  Keep a log of your blood pressure measurements and bring along this log to your clinic appointment.  Your goal blood pressure is to get the top number below 188mHg.  Let [place] if your blood pressure persistently remains above 1361mg.  Hold your blood pressure medications  when the top number is below 10039m.  Let us [place] if this happens frequently.  Keep to a low-salt diet.  Please take the blood pressure medications as prescribed .    The following are some measures which may help to slow down the progression of kidney disease:    Avoid medications known as  NSAIDs which  include Advil, Motrin,Aleve, ibuprofen, naproxen.    Maintain adequate fluid intake.    Acute illnesses like the flu and COVID which may end up in hospitalization may impact on  kidney function and may cause the kidneys to fail.    It is important to keep a up with your regular vaccinations for these diseases and for the pneumonia vaccine as well.    If you develop diarrhea or vomiting and unable to keep up with your fluid intake, please report to the nearest ED since it may affect blood pressure and affect the blood flow to the kidney    Some medications used in patients with kidney disease including diuretics [water pill], some blood pressure medications [ACE inhibitors and ARB] like lisinopril, losartan may need to be held if you develop severe diarrhea or vomiting.  Under these conditions these medications may worsen kidney function.    Visit the following web site for information on diet in patients with kidney disease:  https://www.kidney.org/nutrition

## 2021-10-05 ENCOUNTER — Encounter: Admit: 2021-10-05 | Discharge: 2021-10-05 | Payer: Medicare Other

## 2021-10-05 NOTE — Telephone Encounter
Nurse spoke with patient and informed him of his lab results and the providers recommendations.    Patient verbalized understanding and had no questions or concerns.    Jaiyana Canale, RN

## 2021-10-05 NOTE — Telephone Encounter
-----   Message from Julious Payer, MD sent at 10/05/2021  8:21 AM CDT -----  Regarding: Vitamin D  Good morning,  Please inform Sean Henderson that his vitamin D level is low. I recommend he starts taking over the counter Vitamin D3 1000 to 2000units daily.    Thank you.    Julious Payer, MD    ----- Message -----  From: Claudius Sis  Sent: 09/30/2021  11:42 AM CDT  To: Julious Payer, MD

## 2021-10-19 ENCOUNTER — Encounter: Admit: 2021-10-19 | Discharge: 2021-10-19 | Payer: Medicare Other

## 2021-10-19 NOTE — Telephone Encounter
-----   Message from Gillian Shields, MD sent at 10/19/2021 10:19 AM CDT -----  Regarding: FW: CPAP Management  Can you order another home sleep study please? Sounds like we weren't able to reach him before    ----- Message -----  From: Ulyess Blossom, LPN  Sent: 06/16/3610  11:19 AM CDT  To: Gillian Shields, MD  Subject: CPAP Management                                  Dr. Katrinka Blazing -        This patient was scheduled with [place] which is a duplication of therapy.  We see that you seen patient on 09/28/2021.  Due to his Pulmonary history you will need to manage anything CPAP related or refer him over to one of the physicians in your department that does sleep.  I contacted the patient and advised him that we were cancelling the appt due to duplication of therapy.  It looks like you had placed a order for a sleep study in December but it was placed incorrectly.  I then transferred it to Pulmonary and they attempted to reach the patient but could not reach him according the message that is documented.  You will need to place orders to Pulmonary Sleep Department so he can have the sleep study completed in your department.      Thank you,   Cherre Blanc, LPN   Lead Sleep Nurse   Dr. Gwendalyn Ege, MD   Severiano Gilbert, APRN-NP

## 2021-10-19 NOTE — Telephone Encounter
Staff message sent to Dr. Tamala Julian to confirm if pt should have in lab CPAP titration.  Will await response to place orders.

## 2021-10-25 ENCOUNTER — Encounter: Admit: 2021-10-25 | Discharge: 2021-10-25 | Payer: Medicare Other

## 2021-10-25 MED ORDER — BUPROPION XL 150 MG PO TB24
ORAL_TABLET | ORAL | 2 refills
Start: 2021-10-25 — End: ?

## 2021-11-09 ENCOUNTER — Encounter: Admit: 2021-11-09 | Discharge: 2021-11-09 | Payer: Medicare Other

## 2021-11-09 DIAGNOSIS — G4733 Obstructive sleep apnea (adult) (pediatric): Secondary | ICD-10-CM

## 2021-11-10 ENCOUNTER — Ambulatory Visit: Admit: 2021-11-10 | Discharge: 2021-11-10 | Payer: Medicare Other

## 2021-11-10 ENCOUNTER — Ambulatory Visit: Admit: 2021-11-10 | Discharge: 2021-11-09 | Payer: Medicare Other

## 2021-11-10 ENCOUNTER — Encounter: Admit: 2021-11-10 | Discharge: 2021-11-10 | Payer: Medicare Other

## 2021-11-10 DIAGNOSIS — I10 Essential (primary) hypertension: Secondary | ICD-10-CM

## 2021-11-10 DIAGNOSIS — C61 Malignant neoplasm of prostate: Secondary | ICD-10-CM

## 2021-11-10 DIAGNOSIS — I739 Peripheral vascular disease, unspecified: Secondary | ICD-10-CM

## 2021-11-10 DIAGNOSIS — Z720 Tobacco use: Secondary | ICD-10-CM

## 2021-11-10 DIAGNOSIS — E7800 Pure hypercholesterolemia, unspecified: Secondary | ICD-10-CM

## 2021-11-10 DIAGNOSIS — G4733 Obstructive sleep apnea (adult) (pediatric): Secondary | ICD-10-CM

## 2021-11-10 DIAGNOSIS — J449 Chronic obstructive pulmonary disease, unspecified: Secondary | ICD-10-CM

## 2021-11-10 DIAGNOSIS — E119 Type 2 diabetes mellitus without complications: Secondary | ICD-10-CM

## 2021-11-10 DIAGNOSIS — E785 Hyperlipidemia, unspecified: Secondary | ICD-10-CM

## 2021-11-10 DIAGNOSIS — N183 CKD (chronic kidney disease) stage 3, GFR 30-59 ml/min (HCC): Secondary | ICD-10-CM

## 2021-11-10 DIAGNOSIS — I25119 Atherosclerotic heart disease of native coronary artery with unspecified angina pectoris: Secondary | ICD-10-CM

## 2021-11-10 DIAGNOSIS — I2510 Atherosclerotic heart disease of native coronary artery without angina pectoris: Secondary | ICD-10-CM

## 2021-11-10 NOTE — Patient Instructions
-   It was nice to see you today!    - Let's continue your current BP medications. Your BP looks good today.     - I am glad you are working on smoking cessation. You can do it!    - Let's repeat a stress test to make sure everything looks ok.    - I'd like to repeat a lipid profile with your next lab draw.     - I have requested a 2 month follow up visit with Dr. Starleen Blue. Please send [place] a MyChart message or call our nurse line at [phone] with any questions or concerns in the meantime.

## 2021-11-11 ENCOUNTER — Encounter: Admit: 2021-11-11 | Discharge: 2021-11-11 | Payer: Medicare Other

## 2021-11-11 DIAGNOSIS — G4733 Obstructive sleep apnea (adult) (pediatric): Secondary | ICD-10-CM

## 2021-11-11 DIAGNOSIS — E785 Hyperlipidemia, unspecified: Secondary | ICD-10-CM

## 2021-11-11 DIAGNOSIS — E119 Type 2 diabetes mellitus without complications: Secondary | ICD-10-CM

## 2021-11-11 DIAGNOSIS — I10 Essential (primary) hypertension: Secondary | ICD-10-CM

## 2021-11-11 DIAGNOSIS — N183 CKD (chronic kidney disease) stage 3, GFR 30-59 ml/min (HCC): Secondary | ICD-10-CM

## 2021-11-11 DIAGNOSIS — J449 Chronic obstructive pulmonary disease, unspecified: Secondary | ICD-10-CM

## 2021-11-11 DIAGNOSIS — Z720 Tobacco use: Secondary | ICD-10-CM

## 2021-11-11 DIAGNOSIS — I739 Peripheral vascular disease, unspecified: Secondary | ICD-10-CM

## 2021-11-11 DIAGNOSIS — I2510 Atherosclerotic heart disease of native coronary artery without angina pectoris: Secondary | ICD-10-CM

## 2021-11-11 DIAGNOSIS — C61 Malignant neoplasm of prostate: Secondary | ICD-10-CM

## 2021-11-11 NOTE — Telephone Encounter
-----   Message from Satira Anis, MD sent at 11/11/2021  8:33 AM CDT -----  Tillie Rung,    Can you increase his CPAP pressure to 12 cm H2O?  We will need a download in 30 days.    Thanks,  LT  ----- Message -----  From: Dorita Fray, MD  Sent: 11/10/2021  11:20 PM CDT  To: Satira Anis, MD    Titration study showed sub-optimal final CPAP pressure at 10 cmH2O. Recommend trying a higher pressure of 12 cmH2O.

## 2021-11-11 NOTE — Telephone Encounter
Contacted pt and advised of recommendations to increase CPAP pressure to 12 cm H2O.  Pt voiced understanding.    Pt state he does not currently have a working CPAP and would like to change DME's.  Pt has been with [facility] in the past, but has had difficulty working with them.  Advised pt RN will have to obtain his original sleep study which pt advised was completed at [facility] in 2009.  Once the report is received, will send order to [facility].  Pt voiced understanding and appreciation of call.    Email sent to [facility] to request sleep study report.

## 2021-11-15 ENCOUNTER — Encounter: Admit: 2021-11-15 | Discharge: 2021-11-15 | Payer: Medicare Other

## 2021-11-15 ENCOUNTER — Ambulatory Visit: Admit: 2021-11-15 | Discharge: 2021-11-15 | Payer: Medicare Other

## 2021-11-15 DIAGNOSIS — I10 Essential (primary) hypertension: Secondary | ICD-10-CM

## 2021-11-15 DIAGNOSIS — I25119 Atherosclerotic heart disease of native coronary artery with unspecified angina pectoris: Secondary | ICD-10-CM

## 2021-11-15 DIAGNOSIS — E7800 Pure hypercholesterolemia, unspecified: Secondary | ICD-10-CM

## 2021-11-15 MED ORDER — SODIUM CHLORIDE 0.9 % IV SOLP
250 mL | INTRAVENOUS | 0 refills | Status: AC | PRN
Start: 2021-11-15 — End: ?

## 2021-11-15 MED ORDER — ALBUTEROL SULFATE 90 MCG/ACTUATION IN HFAA
2 | RESPIRATORY_TRACT | 0 refills | Status: AC | PRN
Start: 2021-11-15 — End: ?

## 2021-11-15 MED ORDER — EUCALYPTUS-MENTHOL MM LOZG
1 | Freq: Once | ORAL | 0 refills | Status: AC | PRN
Start: 2021-11-15 — End: ?

## 2021-11-15 MED ORDER — RP DX TC-99M TETROFOSMIN MCI
7 | Freq: Once | INTRAVENOUS | 0 refills | Status: CP
Start: 2021-11-15 — End: ?
  Administered 2021-11-15: 14:00:00 7.7 via INTRAVENOUS

## 2021-11-15 MED ORDER — NITROGLYCERIN 0.4 MG SL SUBL
.4 mg | SUBLINGUAL | 0 refills | Status: AC | PRN
Start: 2021-11-15 — End: ?

## 2021-11-15 MED ORDER — REGADENOSON 0.4 MG/5 ML IV SYRG
.4 mg | Freq: Once | INTRAVENOUS | 0 refills | Status: CP
Start: 2021-11-15 — End: ?
  Administered 2021-11-15: 14:00:00 0.4 mg via INTRAVENOUS

## 2021-11-15 MED ORDER — RP DX TC-99M TETROFOSMIN MCI
21 | Freq: Once | INTRAVENOUS | 0 refills | Status: CP
Start: 2021-11-15 — End: ?
  Administered 2021-11-15: 16:00:00 21.9 via INTRAVENOUS

## 2021-11-15 MED ORDER — AMINOPHYLLINE 25 MG/ML IV SOLN
50 mg | INTRAVENOUS | 0 refills | Status: AC | PRN
Start: 2021-11-15 — End: ?
  Administered 2021-11-15: 14:00:00 50 mg via INTRAVENOUS

## 2021-12-12 NOTE — Progress Notes
Telephone Visit  Counseling and Shared Decision Making Documentation for Screening for Lung Cancer with Low Dose Computed Tomography        Beneficiary eligibility criteria were verified to include:   Age 67-63 years old-65 y.o.  Symptoms- None.     Social History     Tobacco Use   Smoking Status Some Days   ? Packs/day: 5.00   ? Years: 48.00   ? Additional pack years: 0.00   ? Total pack years: 240.00   ? Types: Cigarettes   ? 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 Recommend continued   annual low-dose CT lung screening in one year.   Coronary artery calcification and stents.       A discussion of the LDCT was provided to include:   Benefits and harm of screening   Possible follow-up diagnostic testing   Over-diagnosis   False positivity   Radiation exposure   Importance of adherence to annual lung cancer screening   Smoking cessation or continued abstinence     The patient meets criteria for a LDCT, questions were answered, and patient has agreed to proceed.      Medical History:   Diagnosis Date   ? CAD (coronary artery disease), native coronary artery 12/09/2007    A.2/04 Normal coronaries [facility] EF 15%  B. 2/07- Chest pain, admit [facility]: Bare metal stent to 90% mLAD, EF 45% C. 12/09/07 Angina, Cath [facility]: BareMetalStent for 80% mLAD, Kissing balloon 80% Dx2  D. 06/17/08- dobutamine stress echo:  LV 5.2. EF 60%. No ischemia.  E. 08/25/09- Dobutamine stress echo: Mild concentric LVH. EF 60%. LA is mildly enlarged. No ischemia. F.  12/01/10 - Dobut   echo:  Terminated w/ HT   ? Cardiomyopathy    ? CKD (chronic kidney disease) stage 3, GFR 30-59 ml/min (HCC) 02/14/2017   ? COPD (chronic obstructive pulmonary disease) (HCC)    ? Coronary artery disease    ? DM (diabetes mellitus) (HCC)    ? HLD (hyperlipidemia)    ? Hypertension ? OSA (obstructive sleep apnea)    ? PAD (peripheral artery disease) (HCC)     LLE, s/p 3 stents (last one on 08/2016 at [facility])   ? Prostate cancer (HCC) 2017    s/p radiation (last treatment late 2018. No surgery nor chemo   ? 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 Recommend continued   annual low-dose CT lung screening in one year.     Coronary artery calcification and stents.       NAVIGATOR NOTES (check all that apply)     [ ]  ? There are no findings which require close attention. Continue yearly   screening according to protocol. (Lung-Rads 1)   [ ]  ? There are non-urgent findings which are likely to be clinically   significant, as described in impression number(s) . (Lung-Rads S)   [ ]  ? There are urgent findings which require attention as described in   impression number(s) . (Lung-Rads S)   [X]  ? Lung  nodule(s) present with follow-up low dose ct recommendations as   described in impression number(s) 1. (Lung-Rads 2)   [ ]  ? Lung nodule(s) or mass(es) with diagnostic recommendations as   described in impression number(s) . (Lung-Rads )       ?Finalized by Elfredia Nevins, M.D. on 12/14/2021 2:03 PM. Dictated by   Elfredia Nevins, M.D. on 12/14/2021 1:54 PM.      We will plan to follow up with the patient in 12 months' time with a LDCT scan of the chest. We discussed additional findings of emphysema and he will plan to follow up with his primary care provider regarding these findings. Pt in agreement with plan and recommendations.        PCP- Dr. Alycia Rossetti, Gilford Rile Crane-McCallister, APRN, FNP-C  Lung Cancer Screening and Thoracic Surgery   [phone]    7 minutes time spent on this encounter including review of chart, review of test results, communication and education, and documentation.

## 2021-12-14 ENCOUNTER — Ambulatory Visit: Admit: 2021-12-14 | Discharge: 2021-12-14 | Payer: Medicare Other

## 2021-12-14 ENCOUNTER — Encounter: Admit: 2021-12-14 | Discharge: 2021-12-14 | Payer: Medicare Other

## 2021-12-14 DIAGNOSIS — Z720 Tobacco use: Secondary | ICD-10-CM

## 2021-12-14 DIAGNOSIS — N183 CKD (chronic kidney disease) stage 3, GFR 30-59 ml/min (HCC): Secondary | ICD-10-CM

## 2021-12-14 DIAGNOSIS — E785 Hyperlipidemia, unspecified: Secondary | ICD-10-CM

## 2021-12-14 DIAGNOSIS — E119 Type 2 diabetes mellitus without complications: Secondary | ICD-10-CM

## 2021-12-14 DIAGNOSIS — G4733 Obstructive sleep apnea (adult) (pediatric): Secondary | ICD-10-CM

## 2021-12-14 DIAGNOSIS — I10 Essential (primary) hypertension: Secondary | ICD-10-CM

## 2021-12-14 DIAGNOSIS — J449 Chronic obstructive pulmonary disease, unspecified: Secondary | ICD-10-CM

## 2021-12-14 DIAGNOSIS — I2510 Atherosclerotic heart disease of native coronary artery without angina pectoris: Secondary | ICD-10-CM

## 2021-12-14 DIAGNOSIS — Z87891 Personal history of nicotine dependence: Secondary | ICD-10-CM

## 2021-12-14 DIAGNOSIS — Z122 Encounter for screening for malignant neoplasm of respiratory organs: Secondary | ICD-10-CM

## 2021-12-14 DIAGNOSIS — C61 Malignant neoplasm of prostate: Secondary | ICD-10-CM

## 2021-12-14 DIAGNOSIS — I739 Peripheral vascular disease, unspecified: Secondary | ICD-10-CM

## 2021-12-20 ENCOUNTER — Encounter: Admit: 2021-12-20 | Discharge: 2021-12-20 | Payer: Medicare Other

## 2021-12-20 MED ORDER — AZELASTINE 137 MCG (0.1 %) NA SPRA
3 refills
Start: 2021-12-20 — End: ?

## 2021-12-21 ENCOUNTER — Ambulatory Visit: Admit: 2021-12-21 | Discharge: 2021-12-21 | Payer: Medicare Other

## 2021-12-21 ENCOUNTER — Encounter: Admit: 2021-12-21 | Discharge: 2021-12-21 | Payer: Medicare Other

## 2021-12-21 DIAGNOSIS — E785 Hyperlipidemia, unspecified: Secondary | ICD-10-CM

## 2021-12-21 DIAGNOSIS — I10 Essential (primary) hypertension: Secondary | ICD-10-CM

## 2021-12-21 DIAGNOSIS — Z720 Tobacco use: Secondary | ICD-10-CM

## 2021-12-21 DIAGNOSIS — N183 Stage 3 chronic kidney disease, unspecified whether stage 3a or 3b CKD (HCC): Secondary | ICD-10-CM

## 2021-12-21 DIAGNOSIS — J449 Chronic obstructive pulmonary disease, unspecified: Secondary | ICD-10-CM

## 2021-12-21 DIAGNOSIS — G4733 Obstructive sleep apnea (adult) (pediatric): Secondary | ICD-10-CM

## 2021-12-21 DIAGNOSIS — I25119 Atherosclerotic heart disease of native coronary artery with unspecified angina pectoris: Secondary | ICD-10-CM

## 2021-12-21 DIAGNOSIS — E7800 Pure hypercholesterolemia, unspecified: Secondary | ICD-10-CM

## 2021-12-21 DIAGNOSIS — E119 Type 2 diabetes mellitus without complications: Secondary | ICD-10-CM

## 2021-12-21 DIAGNOSIS — I739 Peripheral vascular disease, unspecified: Secondary | ICD-10-CM

## 2021-12-21 DIAGNOSIS — I2510 Atherosclerotic heart disease of native coronary artery without angina pectoris: Secondary | ICD-10-CM

## 2021-12-21 DIAGNOSIS — C61 Malignant neoplasm of prostate: Secondary | ICD-10-CM

## 2021-12-21 LAB — BASIC METABOLIC PANEL
CALCIUM: 9.2 mg/dL (ref 8.5–10.6)
EGFR: 57 mL/min — ABNORMAL LOW (ref >60)
POTASSIUM: 3.8 MMOL/L (ref 3.5–5.1)
SODIUM: 143 MMOL/L (ref 137–147)

## 2021-12-21 LAB — CBC
HEMOGLOBIN: 15 g/dL — ABNORMAL HIGH (ref 13.5–16.5)
MCHC: 33 g/dL (ref 32.0–36.0)
MCV: 85 FL (ref 80–100)
PLATELET COUNT: 231 K/UL (ref >60)
RBC COUNT: 5.5 M/UL — ABNORMAL HIGH (ref 4.4–5.5)
WBC COUNT: 7.3 K/UL — ABNORMAL LOW (ref 4.5–11.0)

## 2021-12-21 LAB — 25-OH VITAMIN D (D2 + D3): VITAMIN D (25-OH) TOTAL: 15 ng/mL — ABNORMAL LOW (ref 30–80)

## 2021-12-21 LAB — LIPID PROFILE
CHOLESTEROL: 138 mg/dL (ref <200)
HDL: 25 mg/dL — ABNORMAL LOW (ref >40)
LDL: 91 mg/dL (ref <100)
NON HDL CHOLESTEROL: 113 mg/dL — ABNORMAL HIGH (ref 0.4–1.24)
TRIGLYCERIDES: 158 mg/dL — ABNORMAL HIGH (ref <150)
VLDL: 32 mg/dL (ref 7–25)

## 2021-12-21 LAB — PARATHYROID HORMONE: PTH HORMONE: 189 pg/mL — ABNORMAL HIGH (ref 10–65)

## 2021-12-21 LAB — PHOSPHORUS: PHOSPHORUS: 2.6 mg/dL — ABNORMAL HIGH (ref 2.0–4.5)

## 2021-12-21 LAB — IRON + BINDING CAPACITY + %SAT+ FERRITIN
IRON BINDING: 289 ug/dL (ref 270–380)
IRON: 55 ug/dL (ref 50–185)

## 2021-12-21 LAB — URINALYSIS, MICROSCOPIC

## 2021-12-21 LAB — PROTEIN/CR RATIO,UR RAN: UR TOTAL PROTEIN,RAN: 96 mg/dL — ABNORMAL LOW (ref 21–30)

## 2021-12-21 MED ORDER — CHLORTHALIDONE 25 MG PO TAB
12.5 mg | ORAL_TABLET | Freq: Every day | ORAL | 3 refills | Status: AC
Start: 2021-12-21 — End: ?

## 2021-12-21 MED ORDER — ERGOCALCIFEROL (VITAMIN D2) 1,250 MCG (50,000 UNIT) PO CAP
1 | ORAL_CAPSULE | ORAL | 0 refills | 56.00000 days | Status: AC
Start: 2021-12-21 — End: ?

## 2021-12-21 NOTE — Progress Notes
History        CC: Evaluation of chronic kidney disease.      HPI: Sean Henderson is a 66 y.o. male  past medical history of obstructive sleep apnea, DM, COPD, hypertension, CAD, heart failure, hx of prostate cancer s/p radiation therapy and CKD.He was evaluated in 2013 by Dr. Rocco Henderson, for cystic kidney disease.  This was diagnosed as acquired cystic kidney disease.  He was again seen in 2015 with similar complaints and the imaging findings were reassuring.    I first saw the patient on 12/31/2020 and at that time he stated that his PMD repeated his BMP and he was told that his serum creatinine was higher than previously. He also completed a renal USG about a month ago.  He denied abdominal discomfort/pain.   Denies hematuria and dysuria. He reported increased urinary frequency, nocturia and urgency which he attributed to his prior radiation therapy. He denied straining when urinating.  Home BP ranged up to  120/70- 190/68mmHg. He reported that he is compliant with his antihypertensives. He denied taking NSAIDS.   Interval history:Poor sleep and tiredness. He has episodes of hypoglycemia, down to 39g/dl  Home BP 409-811/91'Y to .  Interval history 09-30-2021 : Worsening cystic disease on renal ultrasound.He reports an abnormal olfactory sensation and saw ENT but it has not improved.   Symptoms have been present for about 3 months. Denies diarrhea/vomiting.   Denies taking NSAIDS.Marland Kitchen Denies straining during urination. Home BP is variable with BP up to 180/90's and other times SBP is down in the 120's.    -- Interval history 12/21/2021: He underwent regadenoson scan on 11/15/2021 which showed EF of 35% with equivocal findings suggesting single-vessel RCA disease.  He states he has been experiencing some good days and some bad days. On the bad days she has low energy. These tend to coincide with episodes of hypoglycemia with BG as low as 39.  Of note he is not on insulin or antidiabetic.  Home BP  130/70's . Highest SBP . He thinks elevated BP may be due to should pain.      Past Medical History   Medical History:   Diagnosis Date   ? CAD (coronary artery disease), native coronary artery 12/09/2007    A.2/04 Normal coronaries [facility] EF 15%  B. 2/07- Chest pain, admit [place]: Bare metal stent to 90% mLAD, EF 45% C. 12/09/07 Angina, Cath [place]: BareMetalStent for 80% mLAD, Kissing balloon 80% Dx2  D. 06/17/08- dobutamine stress echo:  LV 5.2. EF 60%. No ischemia.  E. 08/25/09- Dobutamine stress echo: Mild concentric LVH. EF 60%. LA is mildly enlarged. No ischemia. F.  12/01/10 - Dobut   echo:  Terminated w/ HT   ? Cardiomyopathy    ? CKD (chronic kidney disease) stage 3, GFR 30-59 ml/min (HCC) 02/14/2017   ? COPD (chronic obstructive pulmonary disease) (HCC)    ? Coronary artery disease    ? DM (diabetes mellitus) (HCC)    ? HLD (hyperlipidemia)    ? Hypertension    ? OSA (obstructive sleep apnea)    ? PAD (peripheral artery disease) (HCC)     LLE, s/p 3 stents (last one on 08/2016 at [facility])   ? Prostate cancer (HCC) 2017    s/p radiation (last treatment late 2018. No surgery nor chemo   ? Tobacco abuse         Family History  Family History   Problem Relation Age of Onset   ? Heart Attack  Mother    ? Hypertension Mother    ? Heart Attack Father    ? Hypertension Father    ? Heart Attack Sister    ? Hypertension Sister    ? Heart Attack Brother    ? Hypertension Brother         Social History  Social History     Socioeconomic History   ? Marital status: Separated   ? Number of children: 7   Occupational History   ? Occupation: [employer]IVER     Employer: DISABLED   Tobacco Use   ? Smoking status: Some Days     Packs/day: 5.00     Years: 48.00     Additional pack years: 0.00     Total pack years: 240.00     Types: Cigarettes     Start date: 04/24/1972   ? Smokeless tobacco: Never   ? Tobacco comments:     Patient states he had somked up to 5 ppd at his peak      *09/30/2021 5 cigarettes daily    Substance and Sexual Activity   ? Alcohol use: Not Currently   ? Drug use: Not Currently     Frequency: 2.0 times per week     Types: Marijuana     Comment: Smokes marijuana 1-2x/week at bedtime.Quit cocaine 1991        Medication    HOME MEDS  ? albuterol 0.083% (PROVENTIL) 2.5 mg /3 mL (0.083 %) nebulizer solution Inhale 3 mL solution by nebulizer as directed every 4 hours. (Patient taking differently: Inhale 3 mL solution by nebulizer as directed every 4 hours as needed.)   ? albuterol sulfate (PROAIR HFA) 90 mcg/actuation HFA aerosol inhaler Inhale one puff to two puffs by mouth into the lungs every 4 hours as needed for Wheezing (or shortness of breath).   ? amLODIPine (NORVASC) 10 mg tablet TAKE 1 TABLET BY MOUTH DAILY   ? ascorbic acid/multivit-min (EMERGEN-C PO) Take 1 tablet by mouth daily.   ? aspirin EC 81 mg tablet Take 1 Tab by mouth daily.   ? atorvastatin (LIPITOR) 40 mg tablet Take one tablet by mouth daily.   ? azelastine (ASTELIN) 137 mcg (0.1 %) nasal spray APPLY TWO SPRAYS TO EACH NOSTRIL AS DIRECTED TWICE DAILY. USE IN EACH NOSTRIL AS DIRECTED   ? betamethasone dipropionate (DIPROSONE) 0.05 % topical ointment USE TO WORST ITCHY AREAS ON BODY TWICE DAILY AS NEEDED   ? budesonide-glycopyr-formoterol (BREZTRI AEROSPHERE) 160-9-4.8 mcg/actuation inhaler Inhale two puffs by mouth into the lungs twice daily.   ? buPROPion XL (WELLBUTRIN XL) 150 mg tablet TAKE ONE TABLET BY MOUTH DAILY FOR 3 DAYS, THEN TWO TABLETS DAILY FOR 90 DAYS. DO NOT CRUSH OR CHEW.   ? carvedilol (COREG) 25 mg tablet Take one tablet by mouth twice daily with meals. Take with food.   ? chlorthalidone (HYGROTON) 25 mg tablet Take one-half tablet by mouth daily.   ? cloNIDine HCL (CATAPRES) 0.1 mg tablet Take one tablet by mouth twice daily. Start with 1 pill at bedtime and advance after 10 days to 1 twice daily   ? clopiDOGrel (PLAVIX) 75 mg tablet Take one tablet by mouth daily. Indications: For peripheral stents - managed and to be filled by [facility] physician   ? ENTRESTO 97-103 mg tablet TAKE 1 TABLET BY MOUTH TWICE A DAY   ? ezetimibe (ZETIA) 10 mg tablet Take one tablet by mouth daily.   ? furosemide (LASIX) 20 mg tablet Take  one tablet by mouth daily as needed. Take for lower extremity swelling   ? gabapentin (NEURONTIN) 100 mg PO capsule Take 1 Cap by mouth three times daily. (Patient taking differently: Take three capsules by mouth twice daily.)   ? hydrALAZINE (APRESOLINE) 100 mg tablet Take one tablet by mouth three times daily.   ? HYDROcodone/acetaminophen (NORCO) 5/325 mg tablet Take one tablet by mouth twice daily as needed for Pain.   ? isosorbide mononitrate ER (IMDUR) 30 mg tablet TAKE 1 TABLET BY MOUTH EVERY MORNING   ? multivit-min/folic/vit K/lycop (MEN'S 50 PLUS DAILY FORMULA PO) Take 1 tablet by mouth daily.   ? mupirocin (CENTANY) 2 % topical ointment Apply  topically to affected area three times daily. Apply to the front of the nose as needed up to three times per day (Patient taking differently: Apply  topically to affected area three times daily as needed. Apply to the front of the nose as needed up to three times per day)   ? Nebulizer Accessories kit Please dispense one nebulizer kit   ? nicotine (NICODERM CQ) 21 mg/day patch Apply one patch to top of skin as directed every 24 hours. Rotate patch location.  Indications: Stop Smoking   ? nicotine polacrilex (NICORETTE) 2 mg gum Take one each by mouth as Needed for Smoking cessation. Chew to soften and park in mouth between lip and gum. May use 1 piece per hour, not to exceed 24 per day, for 12 weeks. May be used for longer, if needed.   ? nicotine(+) (NICOTROL) 10 mg inhaler Inhale one puff by mouth into the lungs as Needed. Puff by mouth as needed. May use 6-16 cartridges per day as needed for up to 6 months.   ? nitroglycerin (NITROSTAT) 0.4 mg tablet Place one tab under tongue every 5 mins not to exceed 3 tabs;as needed for chest pain. If chest pain persists go to the ER or call 911.   ? spironolactone (ALDACTONE) 50 mg tablet Take one tablet by mouth daily. Take with food.   ? tralokinumab-ldrm (ADBRY) 150 mg/mL injection syringe Inject 2 mL under the skin every 7 days. Take weekly for 2 weeks, then off 2 weeks, then weekly for 2 weeks.   ? traZODone (DESYREL) 100 mg tablet Take two tablets by mouth at bedtime as needed.          Review of Systems  Constitutional: negative  Eyes: negative  Ears, nose, mouth, throat, and face: negative  Respiratory: negative  Cardiovascular: negative  Gastrointestinal: negative  Genitourinary:negative  Integument/breast: negative  Hematologic/lymphatic: negative  Musculoskeletal:negative  Neurological: negative  Endocrine: negative      Physical Exam        Vitals:    12/21/21 0900   BP: (!) 176/77   BP Source: Arm, Left Upper   Pulse: 64   PainSc: Six   Weight: 106.6 kg (235 lb)   Height: 180.3 cm (5' 11)     Body mass index is 32.78 kg/m?Marland Kitchen      BP Readings from Last 5 Encounters:   12/21/21 (!) 176/77   11/10/21 129/64   09/30/21 (!) 143/80   09/28/21 134/69   09/12/21 (!) 173/92       Rechecked BP: 179/63mmHg      Gen: Alert and Oriented, No Acute Distress   HEENT: Sclera normal; MMM  CV:  S1 and S2 normal, no rubs, murmurs or gallops   Pulm: Clear to Auscultation bilateral   GI: BS+ x4, non-tender  to palpation  Neuro: Grossly normal, moving all extremities, speech intact  Ext: no edema, clubbing or cyanosis   Skin: no rash     Assessment and Plan        Sean Henderson is a 66 y.o. male     -  Chronic kidney disease 3B  Patient has baseline CKD 2 to CKD 3A.  Serum creatinine and EGFR have been stable over the past couple of years with largely bland urinalysis.  He has history of hypertension and diabetes as well as acquired cystic kidney disease. He has subnephrotic range proteinuria. Genetic cause of kidney disease ruled out.  He has history of uncontrolled hypertension. Heart failure is another contributor to kidney disease in this patient. He has worsening EF which may worsen his kidney diseaseHis blood pressure remains uncontrolled which will promote worsening kidney function.  Marland Kitchen Antihypertensive changes as outlined below.        ?  -   Cystic kidney disease  This is likely acquired cystic disease.  No genetic causes of cystic disease found.  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     -.  Heart failure  His last echocardiogram on 07/07/2019 showed estimated LVEF of about 50%.  10/2021 : 35% on regadenoson scan .  He was started on empagliflozin in April 2022.  ?  -.  Coronary artery disease  Patient has had multiple stenting of his LAD in 2007, 2009, and 2018.  I reached out to his cardiology team with the antihypertensive changes.  ?  -.  Obstructive sleep apnea  Patient is on CPAP but has not been using it consistently.  He is concerned that he may have the wrong settings on his CPAP.  He has a pending appointment to see his pulmonologist.    ?  -.  Essential hypertension  The patient's blood pressure is uncontolled. He had renin and aldo assessment in 2005 which was unremarkable.  I will reassess this with his next set of labs although interpretation will require caution on account of the multiple medications. NPPV may be reduced.  I have started him on chlorthalidone 12.5mg  po daily  Continue carvedilol 25mg  po Q12h  Amlodipine 10mg  daily  Hydralazine 100mg  po tid  Spironolactone 50mg  daily    I changed furosemide dosing from 20mg  daily to PRN.      We also discussed low-salt diet.          Doran Durand, MD      Patient Instructions   Continue amlodipine 10mg  daily  Carvedilol 25mg  po twice daily  Hydralazine  100mg po tid  Spironolactone 50mg  daily  Entresto daily    Take furosemide only as needed for lower extremity swelling  Start chlorthalidone 12.5mg  po daily.      Stop taking the vitamin C supplement.    Reassess BMP in 2 weeks.  Please monitor your blood pressure 2-3 times a week.  Keep a log of your blood pressure measurements and bring along this log to your clinic appointment.  Your goal blood pressure is to get the top number below .  Let us know if your blood pressure persistently remains above .  Hold your blood pressure medications  when the top number is below .  Let us know if this happens frequently.  Keep to a low-salt diet.  Please take the blood pressure medications as prescribed .  The following are some measures which may help to  slow down the progression of kidney disease:      Maintain adequate fluid intake.    Acute illnesses like the flu and COVID which may end up in hospitalization may impact on  kidney function and may cause the kidneys to fail.    It is important to keep a up with your regular vaccinations for these diseases and for the pneumonia vaccine as well.    If you develop diarrhea or vomiting and unable to keep up with your fluid intake, please report to the nearest ED since it may affect blood pressure and affect the blood flow to the kidney.    Avoid medications known as  NSAIDs which  include Advil, Motrin,Aleve, ibuprofen, naproxen.    Some medications used in patients with kidney disease including diuretics [water pill], some blood pressure medications [ACE inhibitors and ARB] like lisinopril, losartan may need to be held if you develop severe diarrhea or vomiting.  Under these conditions these medications may worsen kidney function and therefore need to be held until diarhea/vomiting resolves. If you have to hold your blood pressure or water pill medication for any of these reasons, please inform the provider who prescribed the medication.    Some medications used for stomach acid suppression  known as Proton Pump Inhibitors (which include pantoprazole, omeprazole, lansoprazole, nexium, protonix, prilosec) have been associated with kidney disease through unknown mechanisms. Unless you have a compelling indication for any of these medications , consider switching to a different class of medications known as histamine-2 receptor blockers eg. famotidine, ranitidine  etc (your pharmacist or PCP can recommend a brand).    Nutrition management is a critical component in slowing kidney disease progression.  Visit the following web site for information on diet in patients with kidney disease:  https://www.kidney.org/nutrition                       Follow up in 3months

## 2021-12-22 ENCOUNTER — Encounter: Admit: 2021-12-22 | Discharge: 2021-12-22 | Payer: Medicare Other

## 2021-12-22 NOTE — Telephone Encounter
Notified pt of all information noted below. Pt expressed understanding with no questions or concerns.

## 2021-12-22 NOTE — Telephone Encounter
-----   Message from Julious Payer, MD sent at 12/21/2021  2:53 PM CDT -----  Kindly inform Mr. Marchena that I have placed an order for ergocalciferol to be taken weekly for 12 weeks for him.  Thank you  ----- Message -----  From: Interface, In Results Misys  Sent: 12/21/2021  10:34 AM CDT  To: Julious Payer, MD

## 2022-01-04 ENCOUNTER — Encounter: Admit: 2022-01-04 | Discharge: 2022-01-04 | Payer: Medicare Other

## 2022-01-04 NOTE — Telephone Encounter
Call to pt to remind of need for repeat labs in 2 weeks after start of chlorthalidone. Pt verbalized understanding.

## 2022-01-06 ENCOUNTER — Encounter: Admit: 2022-01-06 | Discharge: 2022-01-06 | Payer: Medicare Other

## 2022-01-06 ENCOUNTER — Ambulatory Visit: Admit: 2022-01-06 | Discharge: 2022-01-06 | Payer: Medicare Other

## 2022-01-06 DIAGNOSIS — I10 Essential (primary) hypertension: Secondary | ICD-10-CM

## 2022-01-06 DIAGNOSIS — N183 Stage 3 chronic kidney disease, unspecified whether stage 3a or 3b CKD (HCC): Secondary | ICD-10-CM

## 2022-01-06 LAB — BASIC METABOLIC PANEL
CALCIUM: 9.7 mg/dL (ref 8.5–10.6)
CO2: 27 MMOL/L (ref 21–30)
GLUCOSE,PANEL: 118 mg/dL — ABNORMAL HIGH (ref 70–100)
POTASSIUM: 3.6 MMOL/L (ref 3.5–5.1)
SODIUM: 143 MMOL/L (ref 137–147)

## 2022-01-12 ENCOUNTER — Ambulatory Visit: Admit: 2022-01-12 | Discharge: 2022-01-12 | Payer: Medicare Other

## 2022-01-12 ENCOUNTER — Encounter: Admit: 2022-01-12 | Discharge: 2022-01-12 | Payer: Medicare Other

## 2022-01-12 DIAGNOSIS — N1832 Stage 3b chronic kidney disease (HCC): Secondary | ICD-10-CM

## 2022-01-12 DIAGNOSIS — I2510 Atherosclerotic heart disease of native coronary artery without angina pectoris: Secondary | ICD-10-CM

## 2022-01-12 DIAGNOSIS — I739 Peripheral vascular disease, unspecified: Secondary | ICD-10-CM

## 2022-01-12 DIAGNOSIS — J449 Chronic obstructive pulmonary disease, unspecified: Secondary | ICD-10-CM

## 2022-01-12 DIAGNOSIS — I10 Essential (primary) hypertension: Secondary | ICD-10-CM

## 2022-01-12 DIAGNOSIS — E119 Type 2 diabetes mellitus without complications: Secondary | ICD-10-CM

## 2022-01-12 DIAGNOSIS — C61 Malignant neoplasm of prostate: Secondary | ICD-10-CM

## 2022-01-12 DIAGNOSIS — G4733 Obstructive sleep apnea (adult) (pediatric): Secondary | ICD-10-CM

## 2022-01-12 DIAGNOSIS — E785 Hyperlipidemia, unspecified: Secondary | ICD-10-CM

## 2022-01-12 DIAGNOSIS — Z720 Tobacco use: Secondary | ICD-10-CM

## 2022-01-12 DIAGNOSIS — I5042 Chronic combined systolic (congestive) and diastolic (congestive) heart failure: Secondary | ICD-10-CM

## 2022-01-12 DIAGNOSIS — N183 CKD (chronic kidney disease) stage 3, GFR 30-59 ml/min (HCC): Secondary | ICD-10-CM

## 2022-01-12 LAB — COMPREHENSIVE METABOLIC PANEL
ALBUMIN: 4.3 g/dL — ABNORMAL LOW (ref 3.5–5.0)
ALK PHOSPHATASE: 106 U/L (ref 25–110)
ALT: 16 U/L (ref 7–56)
ANION GAP: 13 K/UL — ABNORMAL HIGH (ref 3–12)
AST: 15 U/L — ABNORMAL HIGH (ref 7–40)
CALCIUM: 9.5 mg/dL (ref 8.5–10.6)
CHLORIDE: 104 MMOL/L (ref 98–110)
CO2: 26 MMOL/L (ref 21–30)
CREATININE: 2 mg/dL — ABNORMAL HIGH (ref 0.4–1.24)
EGFR: 35 mL/min — ABNORMAL LOW (ref >60)
GLUCOSE,PANEL: 212 mg/dL — ABNORMAL HIGH (ref 70–100)
POTASSIUM: 4 MMOL/L (ref 3.5–5.1)
SODIUM: 143 MMOL/L (ref 137–147)
TOTAL BILIRUBIN: 0.4 mg/dL (ref 0.3–1.2)
TOTAL PROTEIN: 7.1 g/dL — ABNORMAL LOW (ref >60)

## 2022-01-12 LAB — CBC AND DIFF
ABSOLUTE BASO COUNT: 0 K/UL (ref 0–0.20)
ABSOLUTE EOS COUNT: 0.6 K/UL — ABNORMAL HIGH (ref 0–0.45)
RBC COUNT: 5.7 M/UL — ABNORMAL HIGH (ref 4.4–5.5)
WBC COUNT: 7.8 K/UL — ABNORMAL LOW (ref 4.5–11.0)

## 2022-01-12 LAB — TSH WITH FREE T4 REFLEX: TSH: 1.3 uU/mL — ABNORMAL HIGH (ref 0.35–5.00)

## 2022-01-12 NOTE — Telephone Encounter
-----   Message from Julious Payer, MD sent at 01/11/2022  1:47 PM CDT -----    ----- Message -----  From: Buel Ream, In Results Misys  Sent: 01/06/2022   9:49 AM CDT  To: Julious Payer, MD

## 2022-01-12 NOTE — Telephone Encounter
Reviewed stable lab results with pt, no changes needed at this time per Dr. Kirby Funk. Pt verbalized understanding no questions/concerns by end of call.

## 2022-01-12 NOTE — Patient Instructions
-   It was nice to see you today!    - Let's obtain some labs and a chest x-ray today to help determine if your cough is related to heart failure or allergies/upper respiratory infection. We will call you with recommendations.    - Your stress test didn't suggest major blockages in your coronary arteries, but the ejection fraction had declined from ~50% to 35%. Continue your current medications for BP, but please start monitoring it at home and make a log of your readings.     - I'd like to repeat an echocardiogram to re-evaluate your ejection fraction and look at the pressures in your heart.    - We would like to lower your LDL down below 70 mg/dl. Let's switch you from atorvastatin 40 mg daily to rosuvastatin 40 mg daily.     - I have requested a 1 month follow up visit. Please send [place] a MyChart message or call our nurse line at [phone] with any questions or concerns in the meantime.

## 2022-02-23 ENCOUNTER — Encounter: Admit: 2022-02-23 | Discharge: 2022-02-23 | Payer: Medicare Other

## 2022-02-23 ENCOUNTER — Ambulatory Visit: Admit: 2022-02-23 | Discharge: 2022-02-23 | Payer: Medicare Other

## 2022-02-23 DIAGNOSIS — I5042 Chronic combined systolic (congestive) and diastolic (congestive) heart failure: Secondary | ICD-10-CM

## 2022-02-23 MED ORDER — PERFLUTREN LIPID MICROSPHERES 1.1 MG/ML IV SUSP
1-10 mL | Freq: Once | INTRAVENOUS | 0 refills | Status: CP | PRN
Start: 2022-02-23 — End: ?
  Administered 2022-02-23: 14:00:00 4 mL via INTRAVENOUS

## 2022-02-25 ENCOUNTER — Encounter: Admit: 2022-02-25 | Discharge: 2022-02-25 | Payer: Medicare Other

## 2022-02-25 MED ORDER — AMLODIPINE 10 MG PO TAB
10 mg | ORAL_TABLET | Freq: Every day | ORAL | 1 refills
Start: 2022-02-25 — End: ?

## 2022-03-03 ENCOUNTER — Ambulatory Visit: Admit: 2022-03-03 | Discharge: 2022-03-03 | Payer: Medicare Other

## 2022-03-03 ENCOUNTER — Encounter: Admit: 2022-03-03 | Discharge: 2022-03-03 | Payer: Medicare Other

## 2022-03-03 DIAGNOSIS — I10 Essential (primary) hypertension: Secondary | ICD-10-CM

## 2022-03-03 DIAGNOSIS — E785 Hyperlipidemia, unspecified: Secondary | ICD-10-CM

## 2022-03-03 DIAGNOSIS — I428 Other cardiomyopathies: Secondary | ICD-10-CM

## 2022-03-03 DIAGNOSIS — I5042 Chronic combined systolic (congestive) and diastolic (congestive) heart failure: Secondary | ICD-10-CM

## 2022-03-03 DIAGNOSIS — E119 Type 2 diabetes mellitus without complications: Secondary | ICD-10-CM

## 2022-03-03 DIAGNOSIS — C61 Malignant neoplasm of prostate: Secondary | ICD-10-CM

## 2022-03-03 DIAGNOSIS — J449 Chronic obstructive pulmonary disease, unspecified: Secondary | ICD-10-CM

## 2022-03-03 DIAGNOSIS — N183 CKD (chronic kidney disease) stage 3, GFR 30-59 ml/min (HCC): Secondary | ICD-10-CM

## 2022-03-03 DIAGNOSIS — I25119 Atherosclerotic heart disease of native coronary artery with unspecified angina pectoris: Secondary | ICD-10-CM

## 2022-03-03 DIAGNOSIS — G4733 Obstructive sleep apnea (adult) (pediatric): Secondary | ICD-10-CM

## 2022-03-03 DIAGNOSIS — Z720 Tobacco use: Secondary | ICD-10-CM

## 2022-03-03 DIAGNOSIS — I739 Peripheral vascular disease, unspecified: Secondary | ICD-10-CM

## 2022-03-03 DIAGNOSIS — I2510 Atherosclerotic heart disease of native coronary artery without angina pectoris: Secondary | ICD-10-CM

## 2022-03-03 DIAGNOSIS — E7800 Pure hypercholesterolemia, unspecified: Secondary | ICD-10-CM

## 2022-03-03 DIAGNOSIS — N1832 Stage 3b chronic kidney disease (HCC): Secondary | ICD-10-CM

## 2022-03-03 MED ORDER — EZETIMIBE 10 MG PO TAB
10 mg | ORAL_TABLET | Freq: Every day | ORAL | 3 refills | Status: AC
Start: 2022-03-03 — End: ?

## 2022-03-03 MED ORDER — FAMOTIDINE 20 MG PO TAB
20 mg | ORAL_TABLET | Freq: Two times a day (BID) | ORAL | 1 refills | 90.00000 days | Status: AC
Start: 2022-03-03 — End: ?

## 2022-03-03 MED ORDER — ROSUVASTATIN 40 MG PO TAB
40 mg | ORAL_TABLET | Freq: Every day | ORAL | 1 refills | 90.00000 days | Status: AC
Start: 2022-03-03 — End: ?

## 2022-03-03 NOTE — Progress Notes
Date of Service: 03/03/2022    Lilbern Slacum is a 66 y.o. male.       HPI   I?had the pleasure of seeing Davd Glazebrook Kaigler?today?for cardiology follow up. I saw him in September 2023. 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 ?His ejection had declined to 34%?on a thallium stress?11/2017 and there was suggestion of limited ischemia.?Repeat coronary angiography on 01/10/2018 demonstrated diffuse moderate coronary artery disease. ?His previous LAD stents were patent with an area of ~50% narrowing between the 2 stents.?LVEDP was normal. ?Medical management was recommended.?He is taking?carvedilol 25 mg twice daily,?high dose Entresto, hydralazine 100 mg TID, Imdur 30 mg daily, amlodipine 10 mg daily, spironolactone 50 mg daily?and furosemide 20 mg intermittently.?He underwent a sleep study for CPAP titration in July 2023 and his final CPAP pressure of 10 cm H2O appeared suboptimal in resolving his obstructive sleep disordered breathing.  The pressure was increased to 12 H2O.  ?  He reported intermittent left-sided chest numbness, which he felt was cardiac in nature and a stress test was ordered for further evaluation. D SPECT regadenoson MPI stress test on 7/25 showed a moderate sized mixed perfusion abnormality, mostly fixed, in the basal, mid and apical inferior walls.  The wall motion in the inferior wall was not particularly more hypokinetic and it was felt like this most likely was soft tissue attenuation rather than prior injury.  There was a similar mostly fixed moderate-sized inferior wall defect on previous study in September 2021. LVEF was 35% with mild dilatation of the LV cavity.  Dr. Hale Bogus reviewed and recommended ongoing risk factor modification and medical management.  He noted that his previous stress test was similar in appearance and led to a heart catheterization in September 2019 and there was nonobstructive coronary artery disease in the RCA.  Lipid profile 12/21/2021 showed LDL 91, HDL 25 and triglycerides 158.  He does not think he is taking Zetia 10 mg daily.  He has been taking atorvastatin 40 mg daily.    When I saw Mr. Mcburnie in September, he had been sick with a runny nose and coughing he had tested for COVID-19 3 times and all were negative.  A chest x-ray on 9/21 did not show acute cardiopulmonary abnormality.  The heart size was normal without pulmonary vascular congestion.  There were no consolidating infiltrates or effusions.  NT proBNP was 292.  A CBC showed hemoglobin 16.1, white blood cell count 7.8.  His eosinophils were elevated.  His creatinine was 2.03 with a BUN 36, sodium 143 and potassium 4.0.  His creatinine was up from 1.36 on 12/21/2021.  He was advised to hold Lasix.    Repeat echo 02/23/2022 showed LVEF 45% with diffuse hypokinesis.  LV wall thickness was increased in the range of 1.4-1.68.  There was moderate asymmetric hypertrophy.  RV size and systolic function were normal.  CVP was normal.  There were no valvular abnormalities.  The aortic root and ascending aorta were mildly dilated with a maximal diameter of 4.18 cm.  ?  Mr. Carthan reports that he has an ongoing cough.  It started with a head cold for 3 days.  He is coughing up a lot of clear phlegm.  He does  not feel like he has postnasal drainage.  He has not been able to use his CPAP machine because of the cough and mucus production.  He has also noted some burning in his stomach.  He follows with the vascular surgeon at [facility].       Vitals:    03/03/22 1307   BP: 132/68   BP Source: Arm, Left Upper   Pulse: 69   SpO2: 96%   O2 Device: CPAP/BiPAP   PainSc: Zero   Weight: 110.7 kg (244 lb)   Height: 180.3 cm (5' 11)     Body mass index is 34.03 kg/m?Marland Kitchen     Past Medical History  Patient Active Problem List    Diagnosis Date Noted   ? Chronic combined systolic and diastolic congestive heart failure (HCC) 07/27/2020   ? Shoulder arthritis 05/14/2020   ? Diabetes mellitus type 2 with peripheral artery disease (HCC) 04/07/2019   ? Bilateral renal cysts 03/04/2018   ? Grief reaction 03/04/2018   ? Influenza A 05/13/2017   ? Prostate cancer (HCC) 03/20/2017   ? Stage 3b chronic kidney disease (HCC) 02/14/2017   ? Peripheral artery disease (HCC) with repeat stenting left leg 11/25/2014     2014 Stent left leg [facility] claudication and distal perfusion improved.   2016 Repeat stent L leg [facility]      ? Right flank pain 10/15/2013   ? SOB (shortness of breath) 04/24/2011   ? History of repair of left rotator cuff 06/20/2010     2010     ? Smoking 03/02/2010     Peak tobacco consumption 4 ppd     ? COPD (chronic obstructive pulmonary disease) (HCC) 04/01/2009     A. 2008 PFT FEV1/FEC 68%, FEV 56%, TLC 86%  B. Mod to severe not on  inhalers followed [facility] Dr Lowella Petties     ? Obstructive sleep apnea  10/28/2008     A. 07/22/08- Sleep study: [place] Mild obstructive breathing overall,moderate degree supine and during REM sleep non-supine.Mild Desats & mild sleep disturbance.   B. 10/06/08- CPAP titration: to 14cm      ? Essential hypertension 12/09/2007   ? HLD (hyperlipidemia) 12/09/2007     A. 11/04 total 189 trig 167 HDL 29 LDL 144   B. 1/05 total 98 trig 397 HDL 30 LDL 54 Lipitor 40>> 10/06- start Vytorin 10-20  C. 6/07- total 104, trig 91, HDL 27, LDL 67- Vytorin 10-20  E. 16/1/09- total 160, trig 89, HDL 21, LDL 118 Myalgias:DC vytorin, Rx Crestor 10, niaspan 1000  G. 03/24/09- total 102, trig 154, HDL 25, LDL 60- simvastatin 40, niaspan 1gm  I. 09/12/10- total 106, trig 79,HDL 29, LDL 64- simvastatin 20, niaspan 1gm     ? CAD (coronary artery disease), native coronary artery 12/09/2007 A.2/04 Normal coronaries TMC EF 15%   B. 2/07- Chest pain, admit [place]: Bare metal stent to 90% mLAD, EF 45%  C. 12/09/07 Angina, Cath [place]: BareMetalStent 80% mLAD, Kissing fashion to 80% 2nd Dx   D. 06/17/08- dobutamine stress echo:  LV 5.2. EF 60%. No ischemia.   E. 08/25/09- Dobutamine stress echo: Mild concentric LVH. EF 60%. LA is mildly enlarged. No ischemia.  F.  12/01/10 - Dobut   echo:  Terminated w/ HTN, HR<85%   EF - 60%  LVEDD 6.3 cm.  G  5/13 Reg Thall EF 52%, no ischemia  12/08/14: Cath- patent prior stent, no obstructive CAD  Dr. Micheline Rough  02/14/17 Cath D/T EF decline: : LAD/ Dx2 stents patent: Mod OM, RCA & PDA disease~ no change from  2016.     ? 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% 2004 09/11/2006     Dilated cardiomyopathy and late onset CAD  A, 06/10/02: Cardiac cath: [facility]: EF 15%. Normal coronaries.   B. 10/04- Echo: LVIDD 8.1 , EF 25% Valves OK  C. 4/05- Echo: EF 45%. LV 5.5 cm>>> BNP 24. tolerates hydralazine, headaches w/ imdur  D. 2/06, Echo: LV 5.6, EF 50%   E. 2/07- Chest pain, admit [place]: Bare metal stent to 90% mLAD, EF 45%  F. 02/19/07- Dobut echo: LV 6.4, EF 40%, No ischemia, Hypertensive BP response.   G. 12/09/07 Angina, Cath [place]: BareMetalStent for 80% mLAD, Kissing balloon 80% Dx2   H. 2/10 Echo EF 60%  I.  8/11 Dobutamine echo EF 60% no ischemia post stress by EKG or Echo.   J 5/13 Reg Thall EF 52%, no ischemia     ? Encounter for long-term (current) use of other medications 09/11/2006     Review of Systems   Constitutional: Negative.   HENT: Negative.    Eyes: Negative.    Cardiovascular: Negative.    Respiratory: Positive for cough.    Endocrine: Negative.    Hematologic/Lymphatic: Negative.    Skin: Negative.    Musculoskeletal: Negative.    Gastrointestinal: Negative.    Genitourinary: Negative.    Neurological: Positive for light-headedness.   Psychiatric/Behavioral: Negative.    Allergic/Immunologic: Negative.      Physical Exam  General Appearance: no acute distress  Skin: warm & intact  HEENT: unremarkable  Neck Veins: neck veins are flat & not distended  Auscultation/Percussion: lungs clear to auscultation, no rales, rhonchi, or wheezing  Cardiac Rhythm: regular rhythm & normal rate  Cardiac Auscultation: Normal S1 & S2, no S3 or S4, no rub  Murmurs: no cardiac murmurs   Extremities: no lower extremity edema  Abdominal Exam: soft, non-tender, bowel sounds normal  Neurologic Exam: oriented to time, place and person; no focal neurologic deficits  Psychiatric: Normal mood and affect.  Behavior is normal. Judgment and thought content normal.     Cardiovascular Studies  ECG was not repeated today.    Cardiovascular Health Factors  Vitals BP Readings from Last 3 Encounters:   03/03/22 132/68   02/23/22 (!) 149/80   01/12/22 (!) 145/92     Wt Readings from Last 3 Encounters:   03/03/22 110.7 kg (244 lb)   02/23/22 109.8 kg (242 lb)   01/12/22 108.9 kg (240 lb)     BMI Readings from Last 3 Encounters:   03/03/22 34.03 kg/m?   02/23/22 33.75 kg/m?   01/12/22 33.47 kg/m?      Smoking Social History     Tobacco Use   Smoking Status Some Days   ? Packs/day: 5.00   ? Years: 48.00   ? Additional pack years: 0.00   ? Total pack years: 240.00   ? Types: Cigarettes   ? Start date: 04/24/1972   Smokeless Tobacco Never   Tobacco Comments    Patient states he had somked up to 5 ppd at his peak     *09/30/2021 5 cigarettes daily       Lipid Profile Cholesterol   Date Value Ref Range Status   12/21/2021 138 <200 MG/DL Final     HDL   Date Value Ref Range Status   12/21/2021 25 (L) >40 MG/DL Final  LDL   Date Value Ref Range Status   12/21/2021 91 <100 mg/dL Final     Triglycerides   Date Value Ref Range Status   12/21/2021 158 (H) <150 MG/DL Final      Blood Sugar Hemoglobin A1C   Date Value Ref Range Status   05/11/2017 7.3 (H) 4.0 - 6.0 % Final     Comment:     The ADA recommends that most patients with type 1 and type 2 diabetes maintain   an A1c level <7%.       Glucose   Date Value Ref Range Status   01/12/2022 212 (H) 70 - 100 MG/DL Final   16/01/9603 540 (H) 70 - 100 MG/DL Final   98/02/9146 87 70 - 100 MG/DL Final   82/95/6213 84 70 - 110 MG/DL Final   08/65/7846 962 (H) 70 - 110 MG/DL Final   95/28/4132 99 70 - 110 MG/DL Final     Glucose, POC   Date Value Ref Range Status   02/14/2017 187 (H) 70 - 100 MG/DL Final   44/04/270 536 (H) 70 - 100 MG/DL Final   64/40/3474 85 70 - 100 MG/DL Final        Problems Addressed Today  Encounter Diagnoses   Name Primary?   ? Chronic combined systolic and diastolic congestive heart failure (HCC) Yes   ? Coronary artery disease involving native coronary artery of native heart with angina pectoris (HCC)    ? Essential hypertension    ? Obstructive sleep apnea     ? Pure hypercholesterolemia    ? 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% 2004    ? Stage 3b chronic kidney disease (HCC)      Assessment and Plan    In conclusion, Mr. Coit underwent a stress test in July for evaluation of atypical chest pain symptoms.  There was a moderate, mostly fixed perfusion abnormality in the inferior wall, felt to be most likely attenuation artifact.  His ejection fraction was decreased at 35%.  It was overall an intermediate risk study.  He had a similar abnormality on his stress test in August 2019 and was referred for heart catheterization, which showed nonobstructive disease in his inferior wall.  We would like to continue medical management and risk factor modification.  Subsequent echocardiogram on 11/2 showed LVEF 45% with normal left ventricular size.  He has moderate asymmetric hypertrophy.  His posterior wall measured 1.41 cm and interventricular septum was 1.68 cm.  I have ordered a technetium PYP scan to evaluate for ATTR cardiac amyloidosis.  I would also like to check kappa/lambda free light chains and serum immunofixation with his next lab draw.    His blood pressure looks improved today.  He will continue carvedilol 25 mg twice daily, high-dose Entresto, amlodipine 10 mg daily, chlorthalidone 12.5 mg daily, spironolactone 50 mg daily, clonidine 0.1 mg twice daily, hydralazine 100 mg 3 times daily and Imdur 30 mg daily.  Sleep study in July showed that his sleep apnea was suboptimally treated and his pressure was increased.    He has an ongoing cough.  He is also noting some stomach burning.  He is going to start Pepcid 20 mg twice daily to see if it helps with his cough and abdominal discomfort.  His BUN and creatinine had trended up to 2.03 and 36 on labs drawn 9/21, from 1.36 with a BUN 21 on 8/30.  He was advised to hold furosemide.  He is going to try resuming  it at 20 mg every other day.  He is also going to schedule follow-up with Dr. Alycia Rossetti regarding his ongoing cough.  He also has an appointment with Dr. Lisette Abu in pulmonology clinic next month.    His LDL was 91 on the lipid profile drawn on 12/21/2021.  I am going to switch him from atorvastatin to rosuvastatin 40 mg daily and he will continue Zetia 10 mg daily.  We would like to lower his LDL down below 70 mg/dL.    I requested a 3 to 36-month follow-up visit with Dr. Hale Bogus.  Encouraged him to contact our office in the interim with any questions or concerns.           Current Medications (including today's revisions)  ? albuterol 0.083% (PROVENTIL) 2.5 mg /3 mL (0.083 %) nebulizer solution Inhale 3 mL solution by nebulizer as directed every 4 hours. (Patient taking differently: Inhale 3 mL solution by nebulizer as directed every 4 hours as needed.)   ? albuterol sulfate (PROAIR HFA) 90 mcg/actuation HFA aerosol inhaler Inhale one puff to two puffs by mouth into the lungs every 4 hours as needed for Wheezing (or shortness of breath).   ? amLODIPine (NORVASC) 10 mg tablet TAKE 1 TABLET BY MOUTH EVERY DAY   ? ascorbic acid/multivit-min (EMERGEN-C PO) Take 1 tablet by mouth daily.   ? aspirin EC 81 mg tablet Take 1 Tab by mouth daily.   ? azelastine (ASTELIN) 137 mcg (0.1 %) nasal spray APPLY TWO SPRAYS TO EACH NOSTRIL AS DIRECTED TWICE DAILY.   ? betamethasone dipropionate (DIPROSONE) 0.05 % topical ointment USE TO WORST ITCHY AREAS ON BODY TWICE DAILY AS NEEDED   ? budesonide-glycopyr-formoterol (BREZTRI AEROSPHERE) 160-9-4.8 mcg/actuation inhaler Inhale two puffs by mouth into the lungs twice daily.   ? carvedilol (COREG) 25 mg tablet Take one tablet by mouth twice daily with meals. Take with food.   ? chlorthalidone (HYGROTON) 25 mg tablet Take one-half tablet by mouth daily.   ? cloNIDine HCL (CATAPRES) 0.1 mg tablet Take one tablet by mouth twice daily. Start with 1 pill at bedtime and advance after 10 days to 1 twice daily   ? clopiDOGrel (PLAVIX) 75 mg tablet Take one tablet by mouth daily. Indications: For peripheral stents - managed and to be filled by [facility] physician   ? ENTRESTO 97-103 mg tablet TAKE 1 TABLET BY MOUTH TWICE A DAY   ? ERGOcalciferoL (vitamin D2) (DRISDOL) 1,250 mcg (50,000 unit) capsule Take one capsule by mouth every 7 days.   ? ezetimibe (ZETIA) 10 mg tablet Take one tablet by mouth daily.   ? famotidine (PEPCID) 20 mg tablet Take one tablet by mouth twice daily.   ? furosemide (LASIX) 20 mg tablet Take one tablet by mouth daily as needed. Take for lower extremity swelling   ? gabapentin (NEURONTIN) 100 mg PO capsule Take 1 Cap by mouth three times daily. (Patient taking differently: Take three capsules by mouth twice daily.)   ? hydrALAZINE (APRESOLINE) 100 mg tablet Take one tablet by mouth three times daily.   ? HYDROcodone/acetaminophen (NORCO) 5/325 mg tablet Take one tablet by mouth twice daily as needed for Pain.   ? isosorbide mononitrate ER (IMDUR) 30 mg tablet TAKE 1 TABLET BY MOUTH EVERY MORNING   ? multivit-min/folic/vit K/lycop (MEN'S 50 PLUS DAILY FORMULA PO) Take 1 tablet by mouth daily.   ? mupirocin (CENTANY) 2 % topical ointment Apply  topically to affected area three times daily. Apply to the front  of the nose as needed up to three times per day (Patient taking differently: Apply  topically to affected area three times daily as needed. Apply to the front of the nose as needed up to three times per day)   ? Nebulizer Accessories kit Please dispense one nebulizer kit   ? nicotine (NICODERM CQ) 21 mg/day patch Apply one patch to top of skin as directed every 24 hours. Rotate patch location.  Indications: Stop Smoking   ? nicotine polacrilex (NICORETTE) 2 mg gum Take one each by mouth as Needed for Smoking cessation. Chew to soften and park in mouth between lip and gum. May use 1 piece per hour, not to exceed 24 per day, for 12 weeks. May be used for longer, if needed.   ? nicotine(+) (NICOTROL) 10 mg inhaler Inhale one puff by mouth into the lungs as Needed. Puff by mouth as needed. May use 6-16 cartridges per day as needed for up to 6 months.   ? nitroglycerin (NITROSTAT) 0.4 mg tablet Place one tab under tongue every 5 mins not to exceed 3 tabs;as needed for chest pain. If chest pain persists go to the ER or call 911.   ? rosuvastatin (CRESTOR) 40 mg tablet Take one tablet by mouth daily.   ? spironolactone (ALDACTONE) 50 mg tablet Take one tablet by mouth daily. Take with food.   ? tralokinumab-ldrm (ADBRY) 150 mg/mL injection syringe Inject 2 mL under the skin every 7 days. Take weekly for 2 weeks, then off 2 weeks, then weekly for 2 weeks.   ? traZODone (DESYREL) 100 mg tablet Take two tablets by mouth at bedtime as needed.

## 2022-03-22 ENCOUNTER — Encounter: Admit: 2022-03-22 | Discharge: 2022-03-22 | Payer: Medicare Other

## 2022-03-22 ENCOUNTER — Ambulatory Visit: Admit: 2022-03-22 | Discharge: 2022-03-22 | Payer: Medicare Other

## 2022-03-22 DIAGNOSIS — I10 Essential (primary) hypertension: Secondary | ICD-10-CM

## 2022-03-22 DIAGNOSIS — I5042 Chronic combined systolic (congestive) and diastolic (congestive) heart failure: Secondary | ICD-10-CM

## 2022-03-22 DIAGNOSIS — I25119 Atherosclerotic heart disease of native coronary artery with unspecified angina pectoris: Secondary | ICD-10-CM

## 2022-03-22 MED ORDER — RP DX TC-99M PYROPHOSPHATE MCI
20 | Freq: Once | INTRAVENOUS | 0 refills | Status: CP
Start: 2022-03-22 — End: ?

## 2022-03-24 ENCOUNTER — Encounter: Admit: 2022-03-24 | Discharge: 2022-03-24 | Payer: Medicare Other

## 2022-03-24 NOTE — Telephone Encounter
12/1 I spoke with Mr. Sean Henderson and informed of normal scan and to keep any future appointments. He will call us if he has future questions. Charlyne Petrin RN

## 2022-03-24 NOTE — Telephone Encounter
-----   Message from Yolonda Kida, [place] sent at 03/23/2022  5:16 PM CST -----  Will you please let Mr. Seth know that his scan is not suggestive of ATTR cardiac amyloidosis?  Thanks, Sharee Pimple

## 2022-03-27 ENCOUNTER — Encounter: Admit: 2022-03-27 | Discharge: 2022-03-27 | Payer: Medicare Other

## 2022-03-27 MED ORDER — FAMOTIDINE 20 MG PO TAB
20 mg | ORAL_TABLET | Freq: Two times a day (BID) | ORAL | 1 refills | 90.00000 days | Status: AC
Start: 2022-03-27 — End: ?

## 2022-03-29 ENCOUNTER — Encounter: Admit: 2022-03-29 | Discharge: 2022-03-29 | Payer: Medicare Other

## 2022-03-29 ENCOUNTER — Ambulatory Visit: Admit: 2022-03-29 | Discharge: 2022-03-30 | Payer: Medicare Other

## 2022-03-29 DIAGNOSIS — J449 Chronic obstructive pulmonary disease, unspecified: Secondary | ICD-10-CM

## 2022-03-29 DIAGNOSIS — Z720 Tobacco use: Secondary | ICD-10-CM

## 2022-03-29 DIAGNOSIS — E785 Hyperlipidemia, unspecified: Secondary | ICD-10-CM

## 2022-03-29 DIAGNOSIS — N183 CKD (chronic kidney disease) stage 3, GFR 30-59 ml/min (HCC): Secondary | ICD-10-CM

## 2022-03-29 DIAGNOSIS — C61 Malignant neoplasm of prostate: Secondary | ICD-10-CM

## 2022-03-29 DIAGNOSIS — G4733 Obstructive sleep apnea (adult) (pediatric): Secondary | ICD-10-CM

## 2022-03-29 DIAGNOSIS — I10 Essential (primary) hypertension: Secondary | ICD-10-CM

## 2022-03-29 DIAGNOSIS — E119 Type 2 diabetes mellitus without complications: Secondary | ICD-10-CM

## 2022-03-29 DIAGNOSIS — I2510 Atherosclerotic heart disease of native coronary artery without angina pectoris: Secondary | ICD-10-CM

## 2022-03-29 DIAGNOSIS — I739 Peripheral vascular disease, unspecified: Secondary | ICD-10-CM

## 2022-03-29 MED ORDER — NICOTINE 21 MG/24 HR TD PT24
1 | MEDICATED_PATCH | TRANSDERMAL | 0 refills | Status: AC
Start: 2022-03-29 — End: ?

## 2022-03-29 MED ORDER — ALBUTEROL SULFATE 90 MCG/ACTUATION IN HFAA
1-2 | RESPIRATORY_TRACT | 3 refills | Status: AC | PRN
Start: 2022-03-29 — End: ?

## 2022-03-29 MED ORDER — NICOTINE 14 MG/24 HR TD PT24
1 | MEDICATED_PATCH | TRANSDERMAL | 0 refills | Status: AC
Start: 2022-03-29 — End: ?

## 2022-03-29 MED ORDER — BUDESONIDE-GLYCOPYR-FORMOTEROL 160-9-4.8 MCG/ACTUATION IN HFAA
2 | Freq: Two times a day (BID) | RESPIRATORY_TRACT | 11 refills | 30.00000 days | Status: AC
Start: 2022-03-29 — End: ?

## 2022-03-29 MED ORDER — NICOTINE 7 MG/24 HR TD PT24
1 | MEDICATED_PATCH | TRANSDERMAL | 0 refills | Status: AC
Start: 2022-03-29 — End: ?

## 2022-03-29 NOTE — Patient Instructions
It was a pleasure seeing you today.      My nurse is Kendra Sewell, RN.  She can be reached at [phone].    Please contact my nurse with any signs and symptoms of worsening productive cough with thick secretions, blood in sputum, chest pain or tightness, shortness of breath, fever, chills, night sweats, or any questions or concerns.    For refills on medications, please have your pharmacy request a refill authorization either electronically or via fax to our office at [phone]. Please allow at least 3 business days for refill requests.    For urgent issues after business hours, weekends, or holidays please call [phone] and request for the pulmonary fellow to be paged.    If you need to cancel or reschedule an appointment, please call [phone].

## 2022-04-09 ENCOUNTER — Encounter: Admit: 2022-04-09 | Discharge: 2022-04-09 | Payer: Medicare Other

## 2022-04-09 DIAGNOSIS — N183 Stage 3 chronic kidney disease, unspecified whether stage 3a or 3b CKD (HCC): Secondary | ICD-10-CM

## 2022-04-11 ENCOUNTER — Encounter: Admit: 2022-04-11 | Discharge: 2022-04-11 | Payer: Medicare Other

## 2022-04-11 NOTE — Telephone Encounter
Spoke with patient regarding lab draws.  He stated he will come by in the morning to have them drawn prior to his appointment with Dr. Kirby Funk.

## 2022-04-11 NOTE — Telephone Encounter
-----   Message from Julious Payer, MD sent at 04/09/2022  8:38 PM CST -----  Regarding: Previsit labs  Hello  Larkin Ina,      Please reach out to the patient to complete the requested labs prior to upcoming appointment  on Wednesday.    Thank you  Julious Payer, MD

## 2022-04-12 ENCOUNTER — Ambulatory Visit: Admit: 2022-04-12 | Discharge: 2022-04-12 | Payer: Medicare Other

## 2022-04-12 ENCOUNTER — Encounter: Admit: 2022-04-12 | Discharge: 2022-04-12 | Payer: Medicare Other

## 2022-04-12 DIAGNOSIS — N183 Stage 3 chronic kidney disease, unspecified whether stage 3a or 3b CKD (HCC): Secondary | ICD-10-CM

## 2022-04-12 DIAGNOSIS — I2510 Atherosclerotic heart disease of native coronary artery without angina pectoris: Secondary | ICD-10-CM

## 2022-04-12 DIAGNOSIS — I10 Essential (primary) hypertension: Secondary | ICD-10-CM

## 2022-04-12 DIAGNOSIS — I5042 Chronic combined systolic (congestive) and diastolic (congestive) heart failure: Secondary | ICD-10-CM

## 2022-04-12 DIAGNOSIS — I25119 Atherosclerotic heart disease of native coronary artery with unspecified angina pectoris: Secondary | ICD-10-CM

## 2022-04-12 DIAGNOSIS — J449 Chronic obstructive pulmonary disease, unspecified: Secondary | ICD-10-CM

## 2022-04-12 DIAGNOSIS — G4733 Obstructive sleep apnea (adult) (pediatric): Secondary | ICD-10-CM

## 2022-04-12 DIAGNOSIS — E785 Hyperlipidemia, unspecified: Secondary | ICD-10-CM

## 2022-04-12 DIAGNOSIS — C61 Malignant neoplasm of prostate: Secondary | ICD-10-CM

## 2022-04-12 DIAGNOSIS — Z720 Tobacco use: Secondary | ICD-10-CM

## 2022-04-12 DIAGNOSIS — I739 Peripheral vascular disease, unspecified: Secondary | ICD-10-CM

## 2022-04-12 DIAGNOSIS — E119 Type 2 diabetes mellitus without complications: Secondary | ICD-10-CM

## 2022-04-12 LAB — URINALYSIS, MICROSCOPIC

## 2022-04-12 LAB — PROTEIN/CR RATIO,UR RAN
PROT CREAT RAT/CAL: 0.2 — ABNORMAL HIGH (ref <0.15)
UR CREATININE, RAN: 230 mg/dL (ref 5.0–8.0)
UR TOTAL PROTEIN,RAN: 50 mg/dL (ref 1.005–1.030)

## 2022-04-12 LAB — URINALYSIS DIPSTICK
GLUCOSE,UA: NEGATIVE
LEUKOCYTES: NEGATIVE
NITRITE: NEGATIVE
URINE ASCORBIC ACID, UA: NEGATIVE
URINE BILE: NEGATIVE
URINE BLOOD: NEGATIVE
URINE KETONE: NEGATIVE

## 2022-04-12 LAB — KAPPA/LAMBDA FREE LIGHT CHAINS
KAPPA FLC: 3.6 mg/dL — ABNORMAL HIGH (ref 0.33–1.94)
KAPPA/LAMBDA FLC: 1.2 mg/dL — ABNORMAL HIGH (ref 0.26–1.65)
LAMBDA FLC: 2.9 mg/dL — ABNORMAL HIGH (ref >60)

## 2022-04-12 LAB — BASIC METABOLIC PANEL
CALCIUM: 9.3 mg/dL (ref 8.5–10.6)
CHLORIDE: 106 MMOL/L — ABNORMAL LOW (ref 98–110)
CO2: 25 MMOL/L — ABNORMAL LOW (ref 21–30)
CREATININE: 2.1 mg/dL — ABNORMAL HIGH (ref 0.4–1.24)
EGFR: 33 mL/min — ABNORMAL LOW (ref >60)
POTASSIUM: 4.1 MMOL/L — ABNORMAL HIGH (ref 3.5–5.1)
SODIUM: 138 MMOL/L — ABNORMAL HIGH (ref 137–147)

## 2022-04-12 NOTE — Progress Notes
History        CC: Evaluation of chronic kidney disease.      HPI: Sean Henderson is a 66 y.o. male  past medical history of obstructive sleep apnea, DM, COPD, hypertension, CAD, heart failure, hx of prostate cancer s/p radiation therapy and CKD.He was evaluated in 2013 by Dr. Rocco Serene, for cystic kidney disease.  This was diagnosed as acquired cystic kidney disease.  He was again seen in 2015 with similar complaints and the imaging findings were reassuring.    I first saw the patient on 12/31/2020 and at that time he stated that his PMD repeated his BMP and he was told that his serum creatinine was higher than previously. He also completed a renal USG about a month ago.  He denied abdominal discomfort/pain.   Denies hematuria and dysuria. He reported increased urinary frequency, nocturia and urgency which he attributed to his prior radiation therapy. He denied straining when urinating.  Home BP ranged up to  120/70- 190/27mmHg. He reported that he is compliant with his antihypertensives. He denied taking NSAIDS.   Interval history:Poor sleep and tiredness. He has episodes of hypoglycemia, down to 39g/dl  Home BP 161-096/04'V to .  Interval history 09-30-2021 : Worsening cystic disease on renal ultrasound.He reports an abnormal olfactory sensation and saw ENT but it has not improved.   Symptoms have been present for about 3 months. Denies diarrhea/vomiting.   Denies taking NSAIDS.Marland Kitchen Denies straining during urination. Home BP is variable with BP up to 180/90's and other times SBP is down in the 120's.    -- Interval history 12/21/2021: He underwent regadenoson scan on 11/15/2021 which showed EF of 35% with equivocal findings suggesting single-vessel RCA disease.  He states he has been experiencing some good days and some bad days. On the bad days he has low energy. These tend to coincide with episodes of hypoglycemia with BG as low as 39.  Of note he is not on insulin or antidiabetic. Home BP  130/70's . Highest SBP . He thinks elevated BP may be due to should pain.    --Interval history 04-12-2022:  He continues to experience days when he feels he has low energy. He occasionally experiences SoB with exertion. This has felt similar to the last time he had his echo done.   Home BP has improved since adding on chlorthalidone.      Past Medical History   Medical History:   Diagnosis Date    CAD (coronary artery disease), native coronary artery 12/09/2007    A.2/04 Normal coronaries [facility] EF 15%  B. 2/07- Chest pain, admit [facility]: Bare metal stent to 90% mLAD, EF 45% C. 12/09/07 Angina, [facility]: BareMetalStent for 80% mLAD, Kissing balloon 80% Dx2  D. 06/17/08- dobutamine stress echo:  LV 5.2. EF 60%. No ischemia.  E. 08/25/09- Dobutamine stress echo: Mild concentric LVH. EF 60%. LA is mildly enlarged. No ischemia. F.  12/01/10 - Dobut   echo:  Terminated w/ HT    Cardiomyopathy     CKD (chronic kidney disease) stage 3, GFR 30-59 ml/min (HCC) 02/14/2017    COPD (chronic obstructive pulmonary disease) (HCC)     Coronary artery disease     DM (diabetes mellitus) (HCC)     HLD (hyperlipidemia)     Hypertension     OSA (obstructive sleep apnea)     PAD (peripheral artery disease) (HCC)     LLE, s/p 3 stents (last one on 08/2016 at [facility])  Prostate cancer (HCC) 2017    s/p radiation (last treatment late 2018. No surgery nor chemo    Tobacco abuse         Family History  Family History   Problem Relation Age of Onset    Heart Attack Mother     Hypertension Mother     Heart Attack Father     Hypertension Father     Heart Attack Sister     Hypertension Sister     Heart Attack Brother     Hypertension Brother         Social History  Social History     Socioeconomic History    Marital status: Separated    Number of children: 7   Occupational History    Occupation: [employer] DRIVER     Employer: DISABLED   Tobacco Use    Smoking status: Some Days     Packs/day: 5.00     Years: 48.00     Additional pack years: 0.00     Total pack years: 240.00     Types: Cigarettes     Start date: 04/24/1972    Smokeless tobacco: Never    Tobacco comments:     Patient states he had somked up to 5 ppd at his peak      *09/30/2021 5 cigarettes daily    Substance and Sexual Activity    Alcohol use: Not Currently    Drug use: Not Currently     Frequency: 2.0 times per week     Types: Marijuana     Comment: Smokes marijuana 1-2x/week at bedtime.Quit cocaine 1991        Medication    HOME MEDS   albuterol 0.083% (PROVENTIL) 2.5 mg /3 mL (0.083 %) nebulizer solution Inhale 3 mL solution by nebulizer as directed every 4 hours. (Patient taking differently: Inhale 3 mL solution by nebulizer as directed every 4 hours as needed.)    albuterol sulfate (PROAIR HFA) 90 mcg/actuation HFA aerosol inhaler Inhale one puff to two puffs by mouth into the lungs every 4 hours as needed for Wheezing (or shortness of breath).    amLODIPine (NORVASC) 10 mg tablet TAKE 1 TABLET BY MOUTH EVERY DAY    ascorbic acid/multivit-min (EMERGEN-C PO) Take 1 tablet by mouth daily.    aspirin EC 81 mg tablet Take 1 Tab by mouth daily.    azelastine (ASTELIN) 137 mcg (0.1 %) nasal spray APPLY TWO SPRAYS TO EACH NOSTRIL AS DIRECTED TWICE DAILY.    betamethasone dipropionate (DIPROSONE) 0.05 % topical ointment USE TO WORST ITCHY AREAS ON BODY TWICE DAILY AS NEEDED    budesonide-glycopyr-formoterol (BREZTRI AEROSPHERE) 160-9-4.8 mcg/actuation inhaler Inhale two puffs by mouth into the lungs twice daily.    carvedilol (COREG) 25 mg tablet Take one tablet by mouth twice daily with meals. Take with food.    chlorthalidone (HYGROTON) 25 mg tablet Take one-half tablet by mouth daily.    cloNIDine HCL (CATAPRES) 0.1 mg tablet Take one tablet by mouth twice daily. Start with 1 pill at bedtime and advance after 10 days to 1 twice daily    clopiDOGrel (PLAVIX) 75 mg tablet Take one tablet by mouth daily. Indications: For peripheral stents - managed and to be filled by [facility] physician    ENTRESTO 97-103 mg tablet TAKE 1 TABLET BY MOUTH TWICE A DAY    ERGOcalciferoL (vitamin D2) (DRISDOL) 1,250 mcg (50,000 unit) capsule Take one capsule by mouth every 7 days.    ezetimibe (ZETIA) 10  mg tablet Take one tablet by mouth daily.    famotidine (PEPCID) 20 mg tablet TAKE 1 TABLET BY MOUTH TWICE A DAY    furosemide (LASIX) 20 mg tablet Take one tablet by mouth daily as needed. Take for lower extremity swelling    gabapentin (NEURONTIN) 100 mg PO capsule Take 1 Cap by mouth three times daily. (Patient taking differently: Take three capsules by mouth twice daily.)    hydrALAZINE (APRESOLINE) 100 mg tablet Take one tablet by mouth three times daily.    HYDROcodone/acetaminophen (NORCO) 5/325 mg tablet Take one tablet by mouth twice daily as needed for Pain.    isosorbide mononitrate ER (IMDUR) 30 mg tablet TAKE 1 TABLET BY MOUTH EVERY MORNING    multivit-min/folic/vit K/lycop (MEN'S 50 PLUS DAILY FORMULA PO) Take 1 tablet by mouth daily.    mupirocin (CENTANY) 2 % topical ointment Apply  topically to affected area three times daily. Apply to the front of the nose as needed up to three times per day (Patient taking differently: Apply  topically to affected area three times daily as needed. Apply to the front of the nose as needed up to three times per day)    Nebulizer Accessories kit Please dispense one nebulizer kit    nicotine (NICODERM CQ) 14 mg/day patch Apply one patch to top of skin as directed every 24 hours. Rotate patch location.  Indications: stop smoking    nicotine (NICODERM CQ) 21 mg/day patch Apply one patch to top of skin as directed every 24 hours. Rotate patch location.  Indications: stop smoking    nicotine (NICODERM CQ) 21 mg/day patch Apply one patch to top of skin as directed every 24 hours. Rotate patch location.  Indications: Stop Smoking    nicotine (NICODERM CQ) 7 mg/day patch Apply one patch to top of skin as directed every 24 hours. Rotate patch location.  Indications: stop smoking    nicotine polacrilex (NICORETTE) 2 mg gum Take one each by mouth as Needed for Smoking cessation. Chew to soften and park in mouth between lip and gum. May use 1 piece per hour, not to exceed 24 per day, for 12 weeks. May be used for longer, if needed.    nicotine(+) (NICOTROL) 10 mg inhaler Inhale one puff by mouth into the lungs as Needed. Puff by mouth as needed. May use 6-16 cartridges per day as needed for up to 6 months.    nitroglycerin (NITROSTAT) 0.4 mg tablet Place one tab under tongue every 5 mins not to exceed 3 tabs;as needed for chest pain. If chest pain persists go to the ER or call 911.    rosuvastatin (CRESTOR) 40 mg tablet Take one tablet by mouth daily.    spironolactone (ALDACTONE) 50 mg tablet Take one tablet by mouth daily. Take with food.    tralokinumab-ldrm (ADBRY) 150 mg/mL injection syringe Inject 2 mL under the skin every 7 days. Take weekly for 2 weeks, then off 2 weeks, then weekly for 2 weeks.    traZODone (DESYREL) 100 mg tablet Take two tablets by mouth at bedtime as needed.    triamcinolone acetonide (KENALOG) 0.1 % topical ointment           Review of Systems  Constitutional: negative  Eyes: negative  Ears, nose, mouth, throat, and face: negative  Respiratory: negative  Cardiovascular: negative  Gastrointestinal: negative  Genitourinary:negative  Integument/breast: negative  Hematologic/lymphatic: negative  Musculoskeletal:negative  Neurological: negative  Endocrine: negative      Physical Exam  Vitals:    04/12/22 0908   BP: 121/61   Pulse: 76   Temp: 36.3 ?C (97.4 ?F)   Weight: 112.9 kg (249 lb)   Height: 180.3 cm (5' 11)       Body mass index is 34.73 kg/m?Marland Kitchen      BP Readings from Last 5 Encounters:   04/12/22 121/61   03/29/22 132/59   03/03/22 132/68   02/23/22 (!) 149/80   01/12/22 (!) 145/92       Rechecked BP: 114/39mmHg      Gen: Alert and Oriented, No Acute Distress   HEENT: Sclera normal; MMM  CV:  S1 and S2 normal, no rubs, murmurs or gallops   Pulm: Clear to Auscultation bilateral   GI: BS+ x4, non-tender to palpation  Neuro: Grossly normal, moving all extremities, speech intact  Ext: no edema, clubbing or cyanosis   Skin: no rash     Assessment and Plan        Sean Henderson is a 66 y.o. male     -  Chronic kidney disease 3B  Patient has baseline CKD 2 to CKD 3A.  Serum creatinine and EGFR have been stable over the past couple of years with largely bland urinalysis.  He has history of hypertension and diabetes as well as acquired cystic kidney disease. He has subnephrotic range proteinuria. Genetic cause of kidney disease ruled out.  He has history of uncontrolled hypertension. Heart failure is another contributor to kidney disease in this patient. He has worsening EF which may worsen his kidney disease.  Continue with blood pressure control as below.  Follow up BMP.           -   Cystic kidney disease  This is likely acquired cystic disease.  No genetic causes of cystic disease found.  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  Continue to monitor.     -.  Heart failure  His last echocardiogram on 07/07/2019 showed estimated LVEF of about 50%.  10/2021 : 35% on regadenoson scan .  He was started on empagliflozin in April 2022.     -.  Coronary artery disease  Patient has had multiple stenting of his LAD in 2007, 2009, and 2018.  I reached out to his cardiology team with the antihypertensive changes.     -.  Obstructive sleep apnea  He is unable to  tolerate CPAP due to reflux.       -.  Essential hypertension  The patient's blood pressure is uncontolled. He had renin and aldo assessment in 2005 which was unremarkable.  I will reassess this with his next set of labs although interpretation will require caution on account of the multiple medications.   Continue chlorthalidone 12.5mg  po daily  Continue carvedilol 25mg  po Q12h  Amlodipine 10mg  daily  Hydralazine 100mg  po tid  Spironolactone 50mg  daily  Clonidine 0.1mg  po twice daily    Furosemide 20mg  daily  PRN.      We also discussed low-salt diet.          Doran Durand, MD      Patient Instructions   Repeat labs in 2 weeks    Please monitor your blood pressure 2-3 times a week.  Keep a log of your blood pressure measurements and bring along this log to your clinic appointment.  Your goal blood pressure is to get the top number below .  Let us know if your blood  pressure persistently remains above .  Hold your blood pressure medications  when the top number is below .  Let us know if this happens frequently.  Keep to a low-salt diet.  Please take the blood pressure medications as prescribed .        The following are some measures which may help to slow down the progression of kidney disease:      Maintain adequate fluid intake.    Acute illnesses like the flu and COVID which may end up in hospitalization may impact on  kidney function and could cause the kidneys to fail.    It is important to keep up with  regular vaccinations for these diseases including the pneumonia vaccine as well.    If you develop diarrhea or vomiting and  are unable to keep up with your fluid intake, please report to the nearest ED since it may affect blood pressure and affect the blood flow to the kidney.    Avoid medications known as  NSAIDs which  include Advil, Motrin,Aleve, ibuprofen, naproxen.    Some medications used in patients with kidney disease including diuretics [water pill], some blood pressure medications [ACE inhibitors and ARB] like lisinopril, losartan may need to be held if you develop severe diarrhea or vomiting.  Under these conditions these medications may worsen kidney function and therefore need to be held until diarhea/vomiting resolves. If you have to hold your blood pressure or water pill medication for any of these reasons, please inform the provider who prescribed the medication.    Some medications used for stomach acid suppression  known as Proton Pump Inhibitors (which include pantoprazole, omeprazole, lansoprazole, nexium, protonix, prilosec) have been associated with kidney disease through unknown mechanisms. Unless you have a compelling indication for any of these medications , consider switching to a different class of medications known as histamine-2 receptor blockers eg. famotidine, ranitidine  etc (your pharmacist or PCP can recommend a brand).    Nutrition management is a critical component in slowing kidney disease progression.  Visit the following web site for information on diet in patients with kidney disease:  https://www.kidney.org/nutrition               Follow up in 3months

## 2022-05-01 NOTE — Progress Notes
Subjective:       History of Present Illness  Sean Henderson is a 67 y.o. male with a past medical history of obstructive sleep apnea, DM, COPD, hypertension, CAD with stents, heart failure, hx of prostate cancer s/p radiation therapy,acquired cystic kidney disease, and CKD 3B.    Patient was diagnosed with diabetes in 2013-2014 he was started with metformin and glipizide at that time but he was having low blood sugar in the same year and he was taken off of glipizide because of that.  Patient said that he continues to have low blood sugar during these years.  His cardiologist started him on Jardiance a year ago but his PCP stopped his Jardiance due to continued low blood sugar.  Patient continues to have low blood sugar after stopping Jardiance he describes episodes that is happening anytime of the day and even after having meals.  He describes episode of having shakiness, tired, vision darkening and feeling like he is going to faint.  He checks his blood sugar is 39.  He drinks juice which gets its better within 2 minutes.    He have noticed weight gain in the last few months    Patient does not have signs around this time.  He checks his blood sugars mainly when he have low blood sugar symptoms.  He states his blood sugar sometimes is high to the 200s    He had steroid shot 2.5 weeks ago. He gets steroid every 3 months at the pain center.     His random blood glucose was 181 today 05/04/2022, A1c 7.8.       Review of Systems   All other systems reviewed and are negative.        Objective:          albuterol 0.083% (PROVENTIL) 2.5 mg /3 mL (0.083 %) nebulizer solution Inhale 3 mL solution by nebulizer as directed every 4 hours. (Patient taking differently: Inhale 3 mL solution by nebulizer as directed every 4 hours as needed.)    albuterol sulfate (PROAIR HFA) 90 mcg/actuation HFA aerosol inhaler Inhale one puff to two puffs by mouth into the lungs every 4 hours as needed for Wheezing (or shortness of breath).    amLODIPine (NORVASC) 10 mg tablet TAKE 1 TABLET BY MOUTH EVERY DAY    ascorbic acid/multivit-min (EMERGEN-C PO) Take 1 tablet by mouth daily.    aspirin EC 81 mg tablet Take 1 Tab by mouth daily.    azelastine (ASTELIN) 137 mcg (0.1 %) nasal spray APPLY TWO SPRAYS TO EACH NOSTRIL AS DIRECTED TWICE DAILY.    betamethasone dipropionate (DIPROSONE) 0.05 % topical ointment USE TO WORST ITCHY AREAS ON BODY TWICE DAILY AS NEEDED    budesonide-glycopyr-formoterol (BREZTRI AEROSPHERE) 160-9-4.8 mcg/actuation inhaler Inhale two puffs by mouth into the lungs twice daily.    carvedilol (COREG) 25 mg tablet Take one tablet by mouth twice daily with meals. Take with food.    chlorthalidone (HYGROTON) 25 mg tablet Take one-half tablet by mouth daily.    cloNIDine HCL (CATAPRES) 0.1 mg tablet Take one tablet by mouth twice daily. Start with 1 pill at bedtime and advance after 10 days to 1 twice daily    clopiDOGrel (PLAVIX) 75 mg tablet Take one tablet by mouth daily. Indications: For peripheral stents - managed and to be filled by [facility] physician    ENTRESTO 97-103 mg tablet TAKE 1 TABLET BY MOUTH TWICE A DAY    ERGOcalciferoL (vitamin D2) (DRISDOL) 1,250 mcg (  50,000 unit) capsule Take one capsule by mouth every 7 days.    ezetimibe (ZETIA) 10 mg tablet Take one tablet by mouth daily.    famotidine (PEPCID) 20 mg tablet TAKE 1 TABLET BY MOUTH TWICE A DAY    furosemide (LASIX) 20 mg tablet Take one tablet by mouth daily as needed. Take for lower extremity swelling    gabapentin (NEURONTIN) 100 mg PO capsule Take 1 Cap by mouth three times daily. (Patient taking differently: Take three capsules by mouth twice daily.)    hydrALAZINE (APRESOLINE) 100 mg tablet Take one tablet by mouth three times daily.    HYDROcodone/acetaminophen (NORCO) 5/325 mg tablet Take one tablet by mouth twice daily as needed for Pain.    isosorbide mononitrate ER (IMDUR) 30 mg tablet TAKE 1 TABLET BY MOUTH EVERY MORNING    multivit-min/folic/vit K/lycop (MEN'S 50 PLUS DAILY FORMULA PO) Take 1 tablet by mouth daily.    mupirocin (CENTANY) 2 % topical ointment Apply  topically to affected area three times daily. Apply to the front of the nose as needed up to three times per day (Patient taking differently: Apply  topically to affected area three times daily as needed. Apply to the front of the nose as needed up to three times per day)    Nebulizer Accessories kit Please dispense one nebulizer kit    nicotine (NICODERM CQ) 14 mg/day patch Apply one patch to top of skin as directed every 24 hours. Rotate patch location.  Indications: stop smoking    nicotine (NICODERM CQ) 21 mg/day patch Apply one patch to top of skin as directed every 24 hours. Rotate patch location.  Indications: stop smoking    nicotine (NICODERM CQ) 21 mg/day patch Apply one patch to top of skin as directed every 24 hours. Rotate patch location.  Indications: Stop Smoking    nicotine (NICODERM CQ) 7 mg/day patch Apply one patch to top of skin as directed every 24 hours. Rotate patch location.  Indications: stop smoking    nicotine polacrilex (NICORETTE) 2 mg gum Take one each by mouth as Needed for Smoking cessation. Chew to soften and park in mouth between lip and gum. May use 1 piece per hour, not to exceed 24 per day, for 12 weeks. May be used for longer, if needed.    nicotine(+) (NICOTROL) 10 mg inhaler Inhale one puff by mouth into the lungs as Needed. Puff by mouth as needed. May use 6-16 cartridges per day as needed for up to 6 months.    nitroglycerin (NITROSTAT) 0.4 mg tablet Place one tab under tongue every 5 mins not to exceed 3 tabs;as needed for chest pain. If chest pain persists go to the ER or call 911.    rosuvastatin (CRESTOR) 40 mg tablet Take one tablet by mouth daily.    spironolactone (ALDACTONE) 50 mg tablet Take one tablet by mouth daily. Take with food.    tralokinumab-ldrm (ADBRY) 150 mg/mL injection syringe Inject 2 mL under the skin every 7 days. Take weekly for 2 weeks, then off 2 weeks, then weekly for 2 weeks.    traZODone (DESYREL) 100 mg tablet Take two tablets by mouth at bedtime as needed.    triamcinolone acetonide (KENALOG) 0.1 % topical ointment      Vitals:    05/04/22 0914   BP: (P) 109/63   BP Source: (P) Arm, Right Upper   Pulse: (P) 81   Resp: (P) 18   SpO2: (P) 98%   PainSc: (P)  Zero   Weight: (P) 110.7 kg (244 lb)   Height: (P) 180.3 cm (5' 11)     Body mass index is 34.03 kg/m? (pended).     Physical Exam  Constitutional:       Appearance: Normal appearance.   HENT:      Head: Normocephalic.      Nose: Nose normal.      Mouth/Throat:      Mouth: Mucous membranes are moist.   Eyes:      Pupils: Pupils are equal, round, and reactive to light.   Cardiovascular:      Rate and Rhythm: Normal rate.      Pulses: Normal pulses.   Pulmonary:      Effort: Pulmonary effort is normal.   Abdominal:      General: Abdomen is flat.   Musculoskeletal:         General: Normal range of motion.      Cervical back: Normal range of motion.   Skin:     General: Skin is warm.   Neurological:      General: No focal deficit present.      Mental Status: He is alert.              Assessment and Plan:    Hypoglycemia  Diabetes mellitus type II, not on pharmacological treatment, A1c 7.8 on 05/04/22.  Patient was diagnosed with diabetes in 2013-2014 he was started with metformin and glipizide at that time but he was having low blood sugar in the same year and he was taken off of glipizide because of that.    His cardiologist started him on Jardiance a year ago but his PCP stopped his Jardiance due to continued low blood sugar 2 months ago.  Continues to have episode of having shakiness, tired, vision darkening and feeling like he is going to faint.  blood sugar as low as 39 with episodes corrected with juice (Whipple's triad positive)  Hypoglycemia unrelated to fasting specific time of the day.  noticed weight gain in the last few months  PLAN  Will place freestyle libre on patient while he is here and will check download after 1 week from his freestyle libre check for trends of hypoglycemia.  If patient has significant hypoglycemia we will plan for in-hospital admission for mixed meal and fasting 72 hours to rule out hyperinsulinism  as will need to check insulin C-peptide levels while having hypoglycemia episodes         Patient seen and discussed with Dr. Erasmo Downer, MD  Endocrinology fellow     ATTESTATION  I personally performed the key portions of the E/M visit, discussed case with Dr.Sedeeq and concur with her documentation of history, physical exam, assessment, and treatment plan unless otherwise noted.      Staff name:  Konrad Felix, MD Date: 05/04/2022

## 2022-05-04 ENCOUNTER — Encounter: Admit: 2022-05-04 | Discharge: 2022-05-04 | Payer: Medicare Other

## 2022-05-04 ENCOUNTER — Ambulatory Visit: Admit: 2022-05-04 | Discharge: 2022-05-05 | Payer: Medicare Other

## 2022-05-04 DIAGNOSIS — E119 Type 2 diabetes mellitus without complications: Secondary | ICD-10-CM

## 2022-05-04 DIAGNOSIS — N183 CKD (chronic kidney disease) stage 3, GFR 30-59 ml/min (HCC): Secondary | ICD-10-CM

## 2022-05-04 DIAGNOSIS — J449 Chronic obstructive pulmonary disease, unspecified: Secondary | ICD-10-CM

## 2022-05-04 DIAGNOSIS — E162 Hypoglycemia, unspecified: Secondary | ICD-10-CM

## 2022-05-04 DIAGNOSIS — Z720 Tobacco use: Secondary | ICD-10-CM

## 2022-05-04 DIAGNOSIS — I739 Peripheral vascular disease, unspecified: Secondary | ICD-10-CM

## 2022-05-04 DIAGNOSIS — E785 Hyperlipidemia, unspecified: Secondary | ICD-10-CM

## 2022-05-04 DIAGNOSIS — I2510 Atherosclerotic heart disease of native coronary artery without angina pectoris: Secondary | ICD-10-CM

## 2022-05-04 DIAGNOSIS — E1151 Type 2 diabetes mellitus with diabetic peripheral angiopathy without gangrene: Secondary | ICD-10-CM

## 2022-05-04 DIAGNOSIS — C61 Malignant neoplasm of prostate: Secondary | ICD-10-CM

## 2022-05-04 DIAGNOSIS — I10 Essential (primary) hypertension: Secondary | ICD-10-CM

## 2022-05-04 DIAGNOSIS — G4733 Obstructive sleep apnea (adult) (pediatric): Secondary | ICD-10-CM

## 2022-05-04 NOTE — Patient Instructions
Please wait for a call to be admitted in the hospital for around 3 days (fasting except water). We need to get sugar at least 55 or lower to run some test to see why this is happening.

## 2022-05-04 NOTE — Progress Notes
Libre 2 cgm applied to left upper arm; overpatch applied  account set up for [employer] on pt phone; Practice ID code entered to share data with clinic; wont be ready for scanning till 1151am.   Reviewed scanning every 8 hours  Reviewed alarms and what and why pt will be hearing an alarm  Reviewed arrows and what action to take with arrow  Reviewed if symptoms to correlate with CGM data, then to verify with a finger stick  Reviewed fingerstick data and cgm data will not be identical  Reviewed [employer] website is very informative and pt friendly if needs more information  Discussed ordering process with medicare  May not qualify for CGM; pt would like info sent anyway for review  Advised wont send till Tuesday [id number] at the earliest.   Pt encouraged to call if any questions or concerns    Libre 2 cgm lot number [id number] exp [phone]

## 2022-05-16 ENCOUNTER — Encounter: Admit: 2022-05-16 | Discharge: 2022-05-16 | Payer: Medicare Other

## 2022-05-16 NOTE — Telephone Encounter
I called and spoke with Mr. Flagg.  He was prescribed steroid for his back therefore blood glucose has been significantly elevated without hypoglycemia.  He has not received a call from DME regarding more sensor.  He has not had hypoglycemia episode.  I explained to him that we will not be able to initiate further evaluation if he has not had hypoglycemia but I will call him back in 1 to 2 weeks.

## 2022-05-30 ENCOUNTER — Encounter: Admit: 2022-05-30 | Discharge: 2022-05-30 | Payer: Medicare Other

## 2022-05-30 MED ORDER — AMLODIPINE 10 MG PO TAB
10 mg | ORAL_TABLET | Freq: Every day | ORAL | 1 refills | Status: AC
Start: 2022-05-30 — End: ?

## 2022-06-20 ENCOUNTER — Encounter: Admit: 2022-06-20 | Discharge: 2022-06-20 | Payer: Medicare Other

## 2022-06-20 ENCOUNTER — Ambulatory Visit: Admit: 2022-06-20 | Discharge: 2022-06-21 | Payer: Medicare Other

## 2022-06-20 DIAGNOSIS — E119 Type 2 diabetes mellitus without complications: Secondary | ICD-10-CM

## 2022-06-20 DIAGNOSIS — G4733 Obstructive sleep apnea (adult) (pediatric): Secondary | ICD-10-CM

## 2022-06-20 DIAGNOSIS — I25119 Atherosclerotic heart disease of native coronary artery with unspecified angina pectoris: Secondary | ICD-10-CM

## 2022-06-20 DIAGNOSIS — I10 Essential (primary) hypertension: Secondary | ICD-10-CM

## 2022-06-20 DIAGNOSIS — E785 Hyperlipidemia, unspecified: Secondary | ICD-10-CM

## 2022-06-20 DIAGNOSIS — Z136 Encounter for screening for cardiovascular disorders: Secondary | ICD-10-CM

## 2022-06-20 DIAGNOSIS — E1151 Type 2 diabetes mellitus with diabetic peripheral angiopathy without gangrene: Secondary | ICD-10-CM

## 2022-06-20 DIAGNOSIS — I5042 Chronic combined systolic (congestive) and diastolic (congestive) heart failure: Secondary | ICD-10-CM

## 2022-06-20 DIAGNOSIS — C61 Malignant neoplasm of prostate: Secondary | ICD-10-CM

## 2022-06-20 DIAGNOSIS — I2510 Atherosclerotic heart disease of native coronary artery without angina pectoris: Secondary | ICD-10-CM

## 2022-06-20 DIAGNOSIS — I739 Peripheral vascular disease, unspecified: Secondary | ICD-10-CM

## 2022-06-20 DIAGNOSIS — N1832 Stage 3b chronic kidney disease (HCC): Secondary | ICD-10-CM

## 2022-06-20 DIAGNOSIS — Z720 Tobacco use: Secondary | ICD-10-CM

## 2022-06-20 DIAGNOSIS — J449 Chronic obstructive pulmonary disease, unspecified: Secondary | ICD-10-CM

## 2022-06-20 DIAGNOSIS — E7800 Pure hypercholesterolemia, unspecified: Secondary | ICD-10-CM

## 2022-06-20 DIAGNOSIS — N183 CKD (chronic kidney disease) stage 3, GFR 30-59 ml/min (HCC): Secondary | ICD-10-CM

## 2022-06-20 DIAGNOSIS — I428 Other cardiomyopathies: Secondary | ICD-10-CM

## 2022-06-20 NOTE — Progress Notes
Date of Service: 06/20/2022    Tomothy Olivos is a 67 y.o. male.       HPI   I had the pleasure of seeing Izan Rosselot today for cardiology follow up. I saw him in November 2023.  He had to cancel an appointment with Dr. Hale Bogus yesterday due to a conflict with another medical appointment.  He is a very pleasant 67 year old male with a past medical history of a nonischemic cardiomyopathy, chronic combined heart failure, CAD with prior LAD stent placement x 2 with bare metal stents in 2007 and 2009, PAD with prior PTA, hypertension, dyslipidemia, obstructive sleep apnea on CPAP therapy, st. IIIB CKD, renal cysts, vitamin D deficiency, prostate cancer and tobacco dependence.  His ejection had declined to 34% on a thallium stress 11/2017 and there was suggestion of limited ischemia. Repeat coronary angiography on 01/10/2018 demonstrated diffuse moderate coronary artery disease.  His previous LAD stents were patent with an area of ~50% narrowing between the 2 stents. LVEDP was normal.  Medical management was recommended. He is taking carvedilol 25 mg twice daily, high dose Entresto, hydralazine 100 mg TID, Imdur 30 mg daily, amlodipine 10 mg daily, spironolactone 50 mg daily and furosemide 20 mg intermittently. He underwent a sleep study for CPAP titration in July 2023 and his final CPAP pressure of 10 cm H2O appeared suboptimal in resolving his obstructive sleep disordered breathing.  The pressure was increased to 12 H2O.     He reported intermittent left-sided chest numbness, which he felt was cardiac in nature and a stress test was ordered for further evaluation. D SPECT regadenoson MPI stress test on 11/15/2021 showed a moderate sized mixed perfusion abnormality, mostly fixed, in the basal, mid and apical inferior walls.  The wall motion in the inferior wall was not particularly more hypokinetic and it was felt like this most likely was soft tissue attenuation rather than prior injury.  There was a similar mostly fixed moderate-sized inferior wall defect on previous study in September 2021. LVEF was 35% with mild dilatation of the LV cavity.  Dr. Hale Bogus reviewed and recommended ongoing risk factor modification and medical management.  He noted that his previous stress test was similar in appearance and led to a heart catheterization in September 2019 and there was nonobstructive coronary artery disease in the RCA.  Lipid profile 12/21/2021 showed LDL 91, HDL 25 and triglycerides 158 and he was switched from atorvastatin to rosuvastatin 40 mg daily.     Repeat echo 02/23/2022 showed LVEF 45% with diffuse hypokinesis.  LV wall thickness was increased in the range of 1.4-1.68.  There was moderate asymmetric hypertrophy.  RV size and systolic function were normal.  CVP was normal.  There were no valvular abnormalities.  The aortic root and ascending aorta were mildly dilated with a maximal diameter of 4.18 cm.  Technetium PYP scan on 03/22/2022 was not suggestive of ATTR cardiac amyloidosis.  Kappa and lambda free light chain levels were elevated, with normal kappa/lambda FLC ratio.  There was no paraprotein on immunofixation.    Mr. Nicklaus had follow-up in pulmonology clinic in December 2023 and was encouraged to resume using his CPAP machine.  He had had problems with reflux and a cough, which improved with starting Pepcid 20 mg twice daily.  He is smoking half a pack of cigarettes per day and they discussed smoking cessation.  He is undergoing annual low-dose lung cancer screening CTs and his most recent one from August 2023 was stable.  He saw Dr. Philomena Course in nephrology clinic in December 2023 and was encouraged to continue to monitor his blood pressure and report consistent readings above 130 systolic.  He was on Jardiance in the past, but it was stopped due to episodes of hypoglycemia.    Mr. Aldana reports that he has been using his old CPAP machine, as it is more comfortable than his new 1.  He struggles with a lot of back and shoulder pain.  He is taking pain pills, which resulted in constipation.  When he is constipated and his stomach is bloated, he notes increased shortness of breath.  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.  He notes that he sometimes gets lightheaded if he is in the steam room for longer periods of time and we discussed that this is likely related to his blood pressure medications and diuretics and vasodilatation and he should use the steam room for briefer periods of time.  He enjoys exercising in the pool.  The last time he tried to do so, he got short of breath and felt heart palpitations and some chest discomfort and has been nervous to do it again.  He still occasionally coughs up a lot of clear thin sputum.  He is taking Lasix 2-3 times per week.  He is smoking about a half a pack of cigarettes per day.           Vitals:    06/20/22 0826   BP: 102/61   BP Source: Arm, Left Upper   Pulse: 73   SpO2: 94%   O2 Device: None (Room air)   PainSc: Zero   Weight: 110.2 kg (243 lb)   Height: 180.3 cm (5' 11)     Body mass index is 33.89 kg/m?Marland Kitchen     Past Medical History  Patient Active Problem List    Diagnosis Date Noted    Chronic combined systolic and diastolic congestive heart failure (HCC) 07/27/2020    Shoulder arthritis 05/14/2020    Diabetes mellitus type 2 with peripheral artery disease (HCC) 04/07/2019    Bilateral renal cysts 03/04/2018    Grief reaction 03/04/2018    Influenza A 05/13/2017    Prostate cancer (HCC) 03/20/2017    Stage 3b chronic kidney disease (HCC) 02/14/2017    Peripheral artery disease (HCC) with repeat stenting left leg 11/25/2014     2014 Stent left leg [facility] claudication and distal perfusion improved.   2016 Repeat stent L leg [facility]       Right flank pain 10/15/2013    SOB (shortness of breath) 04/24/2011    History of repair of left rotator cuff 06/20/2010     2010      Smoking 03/02/2010     Peak tobacco consumption 4 ppd      COPD (chronic obstructive pulmonary disease) (HCC) 04/01/2009     A. 2008 PFT FEV1/FEC 68%, FEV 56%, TLC 86%  B. Mod to severe not on  inhalers followed by [facility] Dr Lowella Petties      Obstructive sleep apnea  10/28/2008     A. 07/22/08- Sleep study: [place] Mild obstructive breathing overall,moderate degree supine and during REM sleep non-supine.Mild Desats & mild sleep disturbance.   B. 10/06/08- CPAP titration: to 14cm       Essential hypertension 12/09/2007    HLD (hyperlipidemia) 12/09/2007     A. 11/04 total 189 trig 167 HDL 29 LDL 144   B. 1/05 total 98 trig 397 HDL 30 LDL 54 Lipitor 40>>  10/06- start Vytorin 10-20  C. 6/07- total 104, trig 91, HDL 27, LDL 67- Vytorin 10-20  E. 53/6/64- total 160, trig 89, HDL 21, LDL 118 Myalgias:DC vytorin, Rx Crestor 10, niaspan 1000  G. 03/24/09- total 102, trig 154, HDL 25, LDL 60- simvastatin 40, niaspan 1gm  I. 09/12/10- total 106, trig 79,HDL 29, LDL 64- simvastatin 20, niaspan 1gm      CAD (coronary artery disease), native coronary artery 12/09/2007     A.2/04 Normal coronaries TMC EF 15%   B. 2/07- Chest pain, admit [place]: Bare metal stent to 90% mLAD, EF 45%  C. 12/09/07 Angina, Cath [place]: BareMetalStent 80% mLAD, Kissing fashion to 80% 2nd Dx   D. 06/17/08- dobutamine stress echo:  LV 5.2. EF 60%. No ischemia.   E. 08/25/09- Dobutamine stress echo: Mild concentric LVH. EF 60%. LA is mildly enlarged. No ischemia.  F.  12/01/10 - Dobut   echo:  Terminated w/ HTN, HR<85%   EF - 60%  LVEDD 6.3 cm.  G  5/13 Reg Thall EF 52%, no ischemia  12/08/14: Cath- patent prior stent, no obstructive CAD  Dr. Micheline Rough   02/14/17 Cath D/T EF decline: : LAD/ Dx2 stents patent: Mod OM, RCA & PDA disease~ no change from  2016.      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% 2004 09/11/2006     Dilated cardiomyopathy and late onset CAD  A, 06/10/02: Cardiac cath: [facility]: EF 15%. Normal coronaries.   B. 10/04- Echo: LVIDD 8.1 , EF 25% Valves OK  C. 4/05- Echo: EF 45%. LV 5.5 cm>>> BNP 24. tolerates hydralazine, headaches w/ imdur  D. 2/06, Echo: LV 5.6, EF 50%   E. 2/07- Chest pain, admit [place]: Bare metal stent to 90% mLAD, EF 45%  F. 02/19/07- Dobut echo: LV 6.4, EF 40%, No ischemia, Hypertensive BP response.   G. 12/09/07 Angina, Cath [place]: BareMetalStent for 80% mLAD, Kissing balloon 80% Dx2   H. 2/10 Echo EF 60%  I.  8/11 Dobutamine echo EF 60% no ischemia post stress by EKG or Echo.   J 5/13 Reg Thall EF 52%, no ischemia      Encounter for long-term (current) use of other medications 09/11/2006     Review of Systems   Constitutional: Negative.   HENT: Negative.     Eyes: Negative.    Cardiovascular:  Positive for dyspnea on exertion.   Respiratory: Negative.     Endocrine: Negative.    Hematologic/Lymphatic: Negative.    Skin: Negative.    Musculoskeletal: Negative.    Gastrointestinal: Negative.    Genitourinary: Negative.    Neurological:  Positive for light-headedness.   Psychiatric/Behavioral: Negative.     Allergic/Immunologic: Negative.        Physical Exam  General Appearance: no acute distress  Skin: warm & intact  HEENT: unremarkable  Neck Veins: JVP is normal with + HJR  Auscultation/Percussion: a few expiratory wheezes  Cardiac Rhythm: regular rhythm & normal rate  Cardiac Auscultation: Normal S1 & S2, no S3 or S4, no rub  Murmurs: no cardiac murmurs   Extremities: no lower extremity edema  Abdominal Exam: soft, non-tender, bowel sounds normal  Neurologic Exam: oriented to time, place and person; no focal neurologic deficits  Psychiatric: Normal mood and affect.  Behavior is normal. Judgment and thought content normal.     Cardiovascular Studies  ECG: Sinus rhythm with heart rate 71 bpm.  First-degree AV block.  Nonspecific T wave changes.    Cardiovascular Health  Factors  Vitals BP Readings from Last 3 Encounters:   06/20/22 102/61   05/04/22 (P) 109/63   04/12/22 121/61     Wt Readings from Last 3 Encounters:   06/20/22 110.2 kg (243 lb)   05/04/22 (P) 110.7 kg (244 lb)   04/12/22 112.9 kg (249 lb)     BMI Readings from Last 3 Encounters:   06/20/22 33.89 kg/m?   05/04/22 (P) 34.03 kg/m?   04/12/22 34.73 kg/m?      Smoking Social History     Tobacco Use   Smoking Status Some Days    Current packs/day: 5.00    Average packs/day: 5.0 packs/day for 50.2 years (250.8 ttl pk-yrs)    Types: Cigarettes    Start date: 04/24/1972   Smokeless Tobacco Never   Tobacco Comments    Patient states he had somked up to 5 ppd at his peak     *09/30/2021 5 cigarettes daily       Lipid Profile Cholesterol   Date Value Ref Range Status   12/21/2021 138 <200 MG/DL Final     HDL   Date Value Ref Range Status   12/21/2021 25 (L) >40 MG/DL Final     LDL   Date Value Ref Range Status   12/21/2021 91 <100 mg/dL Final     Triglycerides   Date Value Ref Range Status   12/21/2021 158 (H) <150 MG/DL Final      Blood Sugar Hemoglobin A1C   Date Value Ref Range Status   05/11/2017 7.3 (H) 4.0 - 6.0 % Final     Comment:     The ADA recommends that most patients with type 1 and type 2 diabetes maintain   an A1c level <7%.       Glucose   Date Value Ref Range Status   04/12/2022 257 (H) 70 - 100 MG/DL Final   16/01/9603 540 (H) 70 - 100 MG/DL Final   98/02/9146 829 (H) 70 - 100 MG/DL Final   56/21/3086 84 70 - 110 MG/DL Final   57/84/6962 952 (H) 70 - 110 MG/DL Final   84/13/2440 99 70 - 110 MG/DL Final     Glucose, POC   Date Value Ref Range Status   05/04/2022 181 (A) 70 - 100 Final   02/14/2017 187 (H) 70 - 100 MG/DL Final   02/18/2535 644 (H) 70 - 100 MG/DL Final        Problems Addressed Today  Encounter Diagnoses   Name Primary?    Chronic combined systolic and diastolic congestive heart failure (HCC) Yes    Screening for heart disease     Coronary artery disease involving native coronary artery of native heart with angina pectoris (HCC)     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% 2004     Pure hypercholesterolemia     Essential hypertension     Diabetes mellitus type 2 with peripheral artery disease (HCC)     Stage 3b chronic kidney disease (HCC)     Obstructive sleep apnea       Assessment and Plan     In conclusion, Mr. Toller has chronic combined heart failure.  His most recent echocardiogram 02/23/2022 showed normal LV size with LVEF 45% with diffuse hypokinesis, normal RV size and systolic function, normal CVP and no significant valvular abnormalities.  He will continue carvedilol 25 mg twice daily, high-dose Entresto, spironolactone 50 mg daily, hydralazine 100 mg 3 times daily and Imdur 30 mg daily.  SGLT2 inhibitor  was stopped due to hypoglycemia.  He is taking furosemide 2 or 3 times per week.  Jugular venous pressure appears normal today with + HJR. He is going to try taking Lasix on a scheduled basis, on Mondays, Wednesdays and Fridays.  NT proBNP was slightly elevated at 292 in September 2023.  I would like to repeat an NT proBNP with any other labs he has drawn next month.    He has underlying coronary artery disease.  He underwent a stress test in July for evaluation of atypical chest pain symptoms.  There was a moderate, mostly fixed perfusion abnormality in the inferior wall, felt to be most likely attenuation artifact.  His ejection fraction was decreased at 35%.  It was overall an intermediate risk study.  He had a similar abnormality on his stress test in August 2019 and was referred for heart catheterization, which showed nonobstructive disease in his inferior wall.  We would like to continue medical management and risk factor modification.  He was switched from atorvastatin to rosuvastatin and encouraged to resume Zetia 10 mg daily after lipid profile in August 2023 showed LDL 91, HDL 25 and triglycerides 158.  I would like to check a lipid profile with his other labs next month.  Goal LDL is less than 70 mg/dL.      His blood pressure is improved today, which may be due to using his CPAP machine more regularly.  He will continue carvedilol 25 mg twice daily, high-dose Entresto, amlodipine 10 mg daily, chlorthalidone 12.5 mg daily, spironolactone 50 mg daily, clonidine 0.1 mg twice daily, hydralazine 100 mg 3 times daily and Imdur 30 mg daily.  Sleep study in July 2023 showed that his sleep apnea was suboptimally treated and his pressure was increased.    He has LVH, his posterior wall measured 1.41 cm on echo in November 2023 and interventricular septum was 1.68 cm.  Technetium PYP scan was negative for ATTR cardiac amyloidosis.  Kappa and lambda free light chains were elevated, with normal ratio.  Serum immunofixation was negative for paraprotein.    I encouraged him to try to engage in a very slowly progressive exercise program.  I think exercise in the pool would be his best option, given his back issues.     I requested a 16-month follow-up visit with Dr. Hale Bogus.  I encouraged him to contact our office in the interim with any questions or concerns.         Current Medications (including today's revisions)   albuterol 0.083% (PROVENTIL) 2.5 mg /3 mL (0.083 %) nebulizer solution Inhale 3 mL solution by nebulizer as directed every 4 hours. (Patient taking differently: Inhale 3 mL solution by nebulizer as directed every 4 hours as needed.)    albuterol sulfate (PROAIR HFA) 90 mcg/actuation HFA aerosol inhaler Inhale one puff to two puffs by mouth into the lungs every 4 hours as needed for Wheezing (or shortness of breath).    amLODIPine (NORVASC) 10 mg tablet TAKE 1 TABLET BY MOUTH EVERY DAY    ascorbic acid/multivit-min (EMERGEN-C PO) Take 1 tablet by mouth daily.    aspirin EC 81 mg tablet Take 1 Tab by mouth daily.    azelastine (ASTELIN) 137 mcg (0.1 %) nasal spray APPLY TWO SPRAYS TO EACH NOSTRIL AS DIRECTED TWICE DAILY.    betamethasone dipropionate (DIPROSONE) 0.05 % topical ointment USE TO WORST ITCHY AREAS ON BODY TWICE DAILY AS NEEDED    budesonide-glycopyr-formoterol (BREZTRI AEROSPHERE) 160-9-4.8 mcg/actuation inhaler Inhale two puffs  by mouth into the lungs twice daily.    carvedilol (COREG) 25 mg tablet Take one tablet by mouth twice daily with meals. Take with food.    chlorthalidone (HYGROTON) 25 mg tablet Take one-half tablet by mouth daily.    cloNIDine HCL (CATAPRES) 0.1 mg tablet Take one tablet by mouth twice daily. Start with 1 pill at bedtime and advance after 10 days to 1 twice daily    clopiDOGrel (PLAVIX) 75 mg tablet Take one tablet by mouth daily. Indications: For peripheral stents - managed and to be filled by [facility] physician    ENTRESTO 97-103 mg tablet TAKE 1 TABLET BY MOUTH TWICE A DAY    ERGOcalciferoL (vitamin D2) (DRISDOL) 1,250 mcg (50,000 unit) capsule Take one capsule by mouth every 7 days.    ezetimibe (ZETIA) 10 mg tablet Take one tablet by mouth daily.    famotidine (PEPCID) 20 mg tablet TAKE 1 TABLET BY MOUTH TWICE A DAY    furosemide (LASIX) 20 mg tablet Take one tablet by mouth daily as needed. Take for lower extremity swelling    gabapentin (NEURONTIN) 100 mg PO capsule Take 1 Cap by mouth three times daily. (Patient taking differently: Take three capsules by mouth twice daily.)    hydrALAZINE (APRESOLINE) 100 mg tablet Take one tablet by mouth three times daily.    HYDROcodone/acetaminophen (NORCO) 5/325 mg tablet Take one tablet by mouth twice daily as needed for Pain.    isosorbide mononitrate ER (IMDUR) 30 mg tablet TAKE 1 TABLET BY MOUTH EVERY MORNING    multivit-min/folic/vit K/lycop (MEN'S 50 PLUS DAILY FORMULA PO) Take 1 tablet by mouth daily.    mupirocin (CENTANY) 2 % topical ointment Apply  topically to affected area three times daily. Apply to the front of the nose as needed up to three times per day (Patient taking differently: Apply  topically to affected area three times daily as needed. Apply to the front of the nose as needed up to three times per day)    Nebulizer Accessories kit Please dispense one nebulizer kit    nicotine (NICODERM CQ) 14 mg/day patch Apply one patch to top of skin as directed every 24 hours. Rotate patch location.  Indications: stop smoking    nicotine (NICODERM CQ) 21 mg/day patch Apply one patch to top of skin as directed every 24 hours. Rotate patch location.  Indications: stop smoking    nicotine (NICODERM CQ) 21 mg/day patch Apply one patch to top of skin as directed every 24 hours. Rotate patch location.  Indications: Stop Smoking    nicotine (NICODERM CQ) 7 mg/day patch Apply one patch to top of skin as directed every 24 hours. Rotate patch location.  Indications: stop smoking    nicotine polacrilex (NICORETTE) 2 mg gum Take one each by mouth as Needed for Smoking cessation. Chew to soften and park in mouth between lip and gum. May use 1 piece per hour, not to exceed 24 per day, for 12 weeks. May be used for longer, if needed.    nicotine(+) (NICOTROL) 10 mg inhaler Inhale one puff by mouth into the lungs as Needed. Puff by mouth as needed. May use 6-16 cartridges per day as needed for up to 6 months.    nitroglycerin (NITROSTAT) 0.4 mg tablet Place one tab under tongue every 5 mins not to exceed 3 tabs;as needed for chest pain. If chest pain persists go to the ER or call 911.    rosuvastatin (CRESTOR) 40 mg tablet Take one  tablet by mouth daily.    spironolactone (ALDACTONE) 50 mg tablet Take one tablet by mouth daily. Take with food.    tralokinumab-ldrm (ADBRY) 150 mg/mL injection syringe Inject 2 mL under the skin every 7 days. Take weekly for 2 weeks, then off 2 weeks, then weekly for 2 weeks.    traZODone (DESYREL) 100 mg tablet Take two tablets by mouth at bedtime as needed.    triamcinolone acetonide (KENALOG) 0.1 % topical ointment

## 2022-06-21 ENCOUNTER — Encounter: Admit: 2022-06-21 | Discharge: 2022-06-21 | Payer: Medicare Other

## 2022-06-21 MED ORDER — AZELASTINE 137 MCG (0.1 %) NA SPRA
NASAL | 2 refills | 50.00000 days | Status: AC
Start: 2022-06-21 — End: ?

## 2022-06-21 MED ORDER — CLOPIDOGREL 75 MG PO TAB
75 mg | ORAL_TABLET | Freq: Every day | ORAL | 3 refills | 90.00000 days | Status: AC
Start: 2022-06-21 — End: ?

## 2022-06-21 NOTE — Telephone Encounter
Refill request received for Astelin.  Patient last seen 03/29/2022.  Follow up appt scheduled for 09/20/2022.  1 month(s) and 2 refill(s) e-scribed per protocol.

## 2022-07-05 ENCOUNTER — Encounter: Admit: 2022-07-05 | Discharge: 2022-07-05 | Payer: Medicare Other

## 2022-07-05 DIAGNOSIS — N183 Stage 3 chronic kidney disease, unspecified whether stage 3a or 3b CKD (HCC): Secondary | ICD-10-CM

## 2022-07-10 ENCOUNTER — Encounter: Admit: 2022-07-10 | Discharge: 2022-07-10 | Payer: Medicare Other

## 2022-07-10 ENCOUNTER — Ambulatory Visit: Admit: 2022-07-10 | Discharge: 2022-07-10 | Payer: Medicare Other

## 2022-07-10 DIAGNOSIS — I25119 Atherosclerotic heart disease of native coronary artery with unspecified angina pectoris: Secondary | ICD-10-CM

## 2022-07-10 DIAGNOSIS — I5042 Chronic combined systolic (congestive) and diastolic (congestive) heart failure: Secondary | ICD-10-CM

## 2022-07-10 DIAGNOSIS — N183 Stage 3 chronic kidney disease, unspecified whether stage 3a or 3b CKD (HCC): Secondary | ICD-10-CM

## 2022-07-10 LAB — URINALYSIS DIPSTICK
LEUKOCYTES: NEGATIVE
NITRITE: NEGATIVE
URINE ASCORBIC ACID, UA: NEGATIVE
URINE BILE: NEGATIVE
URINE BLOOD: NEGATIVE
URINE KETONE: NEGATIVE

## 2022-07-10 LAB — BASIC METABOLIC PANEL
ANION GAP: 11 % (ref 3–12)
BLD UREA NITROGEN: 53 mg/dL — ABNORMAL HIGH (ref 7–25)
CALCIUM: 9.1 mg/dL (ref 8.5–10.6)
CHLORIDE: 105 MMOL/L (ref 98–110)
CO2: 24 MMOL/L — ABNORMAL LOW (ref 21–30)
CREATININE: 2.2 mg/dL — ABNORMAL HIGH (ref 0.4–1.24)
EGFR: 31 mL/min — ABNORMAL LOW (ref >60)
GLUCOSE,PANEL: 173 mg/dL — ABNORMAL HIGH (ref 70–100)
POTASSIUM: 4.3 MMOL/L (ref 3.5–5.1)
SODIUM: 140 MMOL/L (ref 137–147)

## 2022-07-10 LAB — CBC AND DIFF
ABSOLUTE BASO COUNT: 0.1 K/UL (ref 0–0.20)
ABSOLUTE EOS COUNT: 0.3 K/UL (ref 0–0.45)
HEMATOCRIT: 43 % (ref 40–50)
MCH: 27 pg (ref 26–34)
MCHC: 32 g/dL (ref 32.0–36.0)
RBC COUNT: 5.2 M/UL (ref <100)
RDW: 15 % — ABNORMAL HIGH (ref 11–15)
WBC COUNT: 9 K/UL — ABNORMAL LOW (ref >40)

## 2022-07-10 LAB — PROTEIN/CR RATIO,UR RAN
UR CREATININE, RAN: 130 mg/dL (ref 5.0–8.0)
UR TOTAL PROTEIN,RAN: 16 mg/dL (ref 1.005–1.030)

## 2022-07-10 LAB — PHOSPHORUS: PHOSPHORUS: 3.6 mg/dL (ref 2.0–4.5)

## 2022-07-10 LAB — URINALYSIS, MICROSCOPIC

## 2022-07-10 LAB — LIPID PROFILE
CHOLESTEROL: 79 mg/dL (ref <200)
TRIGLYCERIDES: 279 mg/dL — ABNORMAL HIGH (ref <150)

## 2022-07-10 LAB — 25-OH VITAMIN D (D2 + D3): VITAMIN D (25-OH) TOTAL: 15 ng/mL — ABNORMAL LOW (ref 30–80)

## 2022-07-12 ENCOUNTER — Emergency Department: Admit: 2022-07-12 | Discharge: 2022-07-12 | Payer: Medicare Other

## 2022-07-12 ENCOUNTER — Encounter: Admit: 2022-07-12 | Discharge: 2022-07-12 | Payer: Medicare Other

## 2022-07-12 ENCOUNTER — Ambulatory Visit: Admit: 2022-07-12 | Discharge: 2022-07-12 | Payer: Medicare Other

## 2022-07-12 DIAGNOSIS — J449 Chronic obstructive pulmonary disease, unspecified: Secondary | ICD-10-CM

## 2022-07-12 DIAGNOSIS — E119 Type 2 diabetes mellitus without complications: Secondary | ICD-10-CM

## 2022-07-12 DIAGNOSIS — I739 Peripheral vascular disease, unspecified: Secondary | ICD-10-CM

## 2022-07-12 DIAGNOSIS — N1832 Stage 3b chronic kidney disease (HCC): Secondary | ICD-10-CM

## 2022-07-12 DIAGNOSIS — E785 Hyperlipidemia, unspecified: Secondary | ICD-10-CM

## 2022-07-12 DIAGNOSIS — Z720 Tobacco use: Secondary | ICD-10-CM

## 2022-07-12 DIAGNOSIS — N183 CKD (chronic kidney disease) stage 3, GFR 30-59 ml/min (HCC): Secondary | ICD-10-CM

## 2022-07-12 DIAGNOSIS — C61 Malignant neoplasm of prostate: Secondary | ICD-10-CM

## 2022-07-12 DIAGNOSIS — I959 Hypotension, unspecified: Secondary | ICD-10-CM

## 2022-07-12 DIAGNOSIS — I5042 Chronic combined systolic (congestive) and diastolic (congestive) heart failure: Secondary | ICD-10-CM

## 2022-07-12 DIAGNOSIS — G4733 Obstructive sleep apnea (adult) (pediatric): Secondary | ICD-10-CM

## 2022-07-12 DIAGNOSIS — I25119 Atherosclerotic heart disease of native coronary artery with unspecified angina pectoris: Secondary | ICD-10-CM

## 2022-07-12 DIAGNOSIS — I2510 Atherosclerotic heart disease of native coronary artery without angina pectoris: Secondary | ICD-10-CM

## 2022-07-12 DIAGNOSIS — I10 Essential (primary) hypertension: Secondary | ICD-10-CM

## 2022-07-12 DIAGNOSIS — E1151 Type 2 diabetes mellitus with diabetic peripheral angiopathy without gangrene: Secondary | ICD-10-CM

## 2022-07-12 DIAGNOSIS — E559 Vitamin D deficiency, unspecified: Secondary | ICD-10-CM

## 2022-07-12 LAB — CBC AND DIFF
ABSOLUTE BASO COUNT: 0 K/UL (ref 0–0.20)
ABSOLUTE EOS COUNT: 0.3 K/UL (ref 0–0.45)
ABSOLUTE LYMPH COUNT: 1.6 K/UL (ref 1.0–4.8)
ABSOLUTE MONO COUNT: 0.6 K/UL (ref 0–0.80)
ABSOLUTE NEUTROPHIL: 5.3 K/UL (ref 1.8–7.0)
BASOPHILS %: 1 % — ABNORMAL LOW (ref >60)
EOSINOPHILS %: 4 % (ref 0–5)
HEMATOCRIT: 46 % — ABNORMAL HIGH (ref 40–50)
LYMPHOCYTES %: 21 % — ABNORMAL LOW (ref 24–44)
MCHC: 32 g/dL (ref 32.0–36.0)
MDW (MONOCYTE DISTRIBUTION WIDTH): 20 — ABNORMAL HIGH (ref <20.7)
MONOCYTES %: 8 % (ref 4–12)
MPV: 8 FL — ABNORMAL HIGH (ref 7–11)
NEUTROPHILS %: 66 % (ref 41–77)
PLATELET COUNT: 228 K/UL (ref 150–400)
RDW: 15 % — ABNORMAL HIGH (ref 11–15)
WBC COUNT: 8 K/UL (ref 4.5–11.0)

## 2022-07-12 LAB — COVID-19 (SARS-COV-2) PCR

## 2022-07-12 LAB — MAGNESIUM: MAGNESIUM: 2.8 mg/dL — ABNORMAL HIGH (ref 1.6–2.6)

## 2022-07-12 LAB — PHOSPHORUS: PHOSPHORUS: 3.5 mg/dL (ref 2.0–4.5)

## 2022-07-12 LAB — COMPREHENSIVE METABOLIC PANEL: SODIUM: 139 MMOL/L (ref 137–147)

## 2022-07-12 LAB — INFLUENZA A/B AND RSV PCR
FLU A: NEGATIVE
FLU B: NEGATIVE
RSV: NEGATIVE

## 2022-07-12 LAB — TSH WITH FREE T4 REFLEX: TSH: 0.8 uU/mL (ref 0.35–5.00)

## 2022-07-12 LAB — HIGH SENSITIVITY TROPONIN I 0 HOUR: HIGH SENSITIVITY TROPONIN I 0 HOUR: 21 ng/L — ABNORMAL HIGH (ref <20)

## 2022-07-12 LAB — LIPASE: LIPASE: 77 U/L (ref 11–82)

## 2022-07-12 LAB — POC LACTATE: LACTIC ACID POC: 1.9 MMOL/L (ref 0.5–2.0)

## 2022-07-12 LAB — POC GLUCOSE: POC GLUCOSE: 320 mg/dL — ABNORMAL HIGH (ref 70–100)

## 2022-07-12 LAB — BETA HYDROXYBUTYRATE (KETONES): BETA HYDROXYBUTYRATE: 0.2 MMOL/L — ABNORMAL HIGH (ref <0.3)

## 2022-07-12 LAB — HIGH SENSITIVITY TROPONIN I 2 HOUR: HIGH SENSITIVITY TROPONIN I 2 HOUR: 19 ng/L — ABNORMAL HIGH (ref <20)

## 2022-07-12 MED ORDER — LACTATED RINGERS IV SOLP
500 mL | INTRAVENOUS | 0 refills | Status: CP
Start: 2022-07-12 — End: ?
  Administered 2022-07-12: 16:00:00 500 mL via INTRAVENOUS

## 2022-07-12 NOTE — ED Notes
67 y.o. male rapid responded from nephrology clinic with c/o hypotension. Pt was there for a follow up appointment d/t hx of CKD. While he was there, he was found to be hypotensive with a SBP in the 70s. Pt reports being diaphoretic at that time and staff reports pt appeared lethargic. Pt reports having these "spells" intermittently over the last couple of weeks. He reports having low energy and a productive cough, he's been coughing up clear phlegm. He denies any fevers or chills. He's A&Ox4. Awaiting provider evaluation.    Medical History:   Diagnosis Date    CAD (coronary artery disease), native coronary artery 12/09/2007    A.2/04 Normal coronaries [facility] EF 15%  B. 2/07- Chest pain, admit [place]: Bare metal stent to 90% mLAD, EF 45% C. 12/09/07 Angina, Cath [place]: BareMetalStent for 80% mLAD, Kissing balloon 80% Dx2  D. 06/17/08- dobutamine stress echo:  LV 5.2. EF 60%. No ischemia.  E. 08/25/09- Dobutamine stress echo: Mild concentric LVH. EF 60%. LA is mildly enlarged. No ischemia. F.  12/01/10 - Dobut   echo:  Terminated w/ HT    Cardiomyopathy     CKD (chronic kidney disease) stage 3, GFR 30-59 ml/min (HCC) 02/14/2017    COPD (chronic obstructive pulmonary disease) (HCC)     Coronary artery disease     DM (diabetes mellitus) (HCC)     HLD (hyperlipidemia)     Hypertension     OSA (obstructive sleep apnea)     PAD (peripheral artery disease) (HCC)     LLE, s/p 3 stents (last one on 08/2016 at [facility])    Prostate cancer [facility]) 2017    s/p radiation (last treatment late 2018. No surgery nor chemo    Tobacco abuse

## 2022-07-12 NOTE — ED Notes
Pt given d/c instructions. Verbalizes understanding. No further questions or concerns at this time.

## 2022-07-12 NOTE — Progress Notes
History        CC: Evaluation of chronic kidney disease.      HPI: Sean Henderson is a 67 y.o. male  past medical history of obstructive sleep apnea, DM, COPD, hypertension, CAD, heart failure, hx of prostate cancer s/p radiation therapy and CKD.He was evaluated in 2013 by Dr. Rocco Serene, for cystic kidney disease.  This was diagnosed as acquired cystic kidney disease.  He was again seen in 2015 with similar complaints and the imaging findings were reassuring.    I first saw the patient on 12/31/2020 and at that time he stated that his PMD repeated his BMP and he was told that his serum creatinine was higher than previously. He also completed a renal USG about a month ago.  He denied abdominal discomfort/pain.   Denies hematuria and dysuria. He reported increased urinary frequency, nocturia and urgency which he attributed to his prior radiation therapy. He denied straining when urinating.  Home BP ranged up to  120/70- 190/51mmHg. He reported that he is compliant with his antihypertensives. He denied taking NSAIDS.   Interval history:Poor sleep and tiredness. He has episodes of hypoglycemia, down to 39g/dl  Home BP 213-086/57'Q to .  Interval history 09-30-2021 : Worsening cystic disease on renal ultrasound.He reports an abnormal olfactory sensation and saw ENT but it has not improved.   Symptoms have been present for about 3 months. Denies diarrhea/vomiting.   Denies taking NSAIDS.Marland Kitchen Denies straining during urination. Home BP is variable with BP up to 180/90's and other times SBP is down in the 120's.    -- Interval history 12/21/2021: He underwent regadenoson scan on 11/15/2021 which showed EF of 35% with equivocal findings suggesting single-vessel RCA disease.  He states he has been experiencing some good days and some bad days. On the bad days he has low energy. These tend to coincide with episodes of hypoglycemia with BG as low as 39.  Of note he is not on insulin or antidiabetic. Home BP  130/70's . Highest SBP . He thinks elevated BP may be due to should pain.    --Interval history 04-12-2022:  He continues to experience days when he feels he has low energy. He occasionally experiences SoB with exertion. This has felt similar to the last time he had his echo done.   Home BP has improved since adding on chlorthalidone.    --Interval history 07-12-2022: He has been sick over the past 3 weeks with high blood glucose ,   He has had a productive cough. BG has been in the 400's  He feels he has fever right now. He does not feel well  He has taken antihypertensisives today.      Past Medical History   Medical History:   Diagnosis Date    CAD (coronary artery disease), native coronary artery 12/09/2007    A.2/04 Normal coronaries [facility] EF 15%  B. 2/07- Chest pain, admit : Bare metal stent to 90% mLAD, EF 45% C. 12/09/07 Angina, Cath [place]: BareMetalStent for 80% mLAD, Kissing balloon 80% Dx2  D. 06/17/08- dobutamine stress echo:  LV 5.2. EF 60%. No ischemia.  E. 08/25/09- Dobutamine stress echo: Mild concentric LVH. EF 60%. LA is mildly enlarged. No ischemia. F.  12/01/10 - Dobut   echo:  Terminated w/ HT    Cardiomyopathy     CKD (chronic kidney disease) stage 3, GFR 30-59 ml/min (HCC) 02/14/2017    COPD (chronic obstructive pulmonary disease) (HCC)     Coronary artery disease  DM (diabetes mellitus) (HCC)     HLD (hyperlipidemia)     Hypertension     OSA (obstructive sleep apnea)     PAD (peripheral artery disease) (HCC)     LLE, s/p 3 stents (last one on [facility]    Prostate cancer [facility]) 2017    s/p radiation (last treatment late 2018. No surgery nor chemo    Tobacco abuse         Family History  Family History   Problem Relation Age of Onset    Heart Attack Mother     Hypertension Mother     Heart Attack Father     Hypertension Father     Heart Attack Sister     Hypertension Sister     Heart Attack Brother     Hypertension Brother         Social History  Social History     Socioeconomic History Marital status: Widowed    Number of children: 7   Occupational History    Occupation: [employer] DRIVER     Employer: DISABLED   Tobacco Use    Smoking status: Some Days     Current packs/day: 5.00     Average packs/day: 5.0 packs/day for 50.2 years (251.1 ttl pk-yrs)     Types: Cigarettes     Start date: 04/24/1972    Smokeless tobacco: Never    Tobacco comments:     Patient states he had somked up to 5 ppd at his peak      *09/30/2021 5 cigarettes daily    Substance and Sexual Activity    Alcohol use: Not Currently    Drug use: Not Currently     Frequency: 2.0 times per week     Types: Marijuana     Comment: Smokes marijuana 1-2x/week at bedtime.Quit cocaine 1991        Medication    HOME MEDS   albuterol 0.083% (PROVENTIL) 2.5 mg /3 mL (0.083 %) nebulizer solution Inhale 3 mL solution by nebulizer as directed every 4 hours. (Patient taking differently: Inhale 3 mL solution by nebulizer as directed every 4 hours as needed.)    albuterol sulfate (PROAIR HFA) 90 mcg/actuation HFA aerosol inhaler Inhale one puff to two puffs by mouth into the lungs every 4 hours as needed for Wheezing (or shortness of breath).    amLODIPine (NORVASC) 10 mg tablet TAKE 1 TABLET BY MOUTH EVERY DAY    ascorbic acid/multivit-min (EMERGEN-C PO) Take 1 tablet by mouth daily.    aspirin EC 81 mg tablet Take 1 Tab by mouth daily.    azelastine (ASTELIN) 137 mcg (0.1 %) nasal spray INHALE TWO SPRAYS INTO EACH NOSTRIL AS DIRECTED TWICE DAILY.    betamethasone dipropionate (DIPROSONE) 0.05 % topical ointment USE TO WORST ITCHY AREAS ON BODY TWICE DAILY AS NEEDED    budesonide-glycopyr-formoterol (BREZTRI AEROSPHERE) 160-9-4.8 mcg/actuation inhaler Inhale two puffs by mouth into the lungs twice daily.    carvedilol (COREG) 25 mg tablet Take one tablet by mouth twice daily with meals. Take with food.    chlorthalidone (HYGROTON) 25 mg tablet Take one-half tablet by mouth daily.    cloNIDine HCL (CATAPRES) 0.1 mg tablet Take one tablet by mouth twice daily. Start with 1 pill at bedtime and advance after 10 days to 1 twice daily    clopiDOGreL (PLAVIX) 75 mg tablet TAKE 1 TABLET BY MOUTH EVERY DAY    ENTRESTO 97-103 mg tablet TAKE 1 TABLET BY MOUTH TWICE A  DAY    ERGOcalciferoL (vitamin D2) (DRISDOL) 1,250 mcg (50,000 unit) capsule Take one capsule by mouth every 7 days.    ezetimibe (ZETIA) 10 mg tablet Take one tablet by mouth daily.    famotidine (PEPCID) 20 mg tablet TAKE 1 TABLET BY MOUTH TWICE A DAY    furosemide (LASIX) 20 mg tablet Take one tablet by mouth daily as needed. Take for lower extremity swelling    gabapentin (NEURONTIN) 100 mg PO capsule Take 1 Cap by mouth three times daily. (Patient taking differently: Take three capsules by mouth twice daily.)    hydrALAZINE (APRESOLINE) 100 mg tablet Take one tablet by mouth three times daily.    HYDROcodone/acetaminophen (NORCO) 5/325 mg tablet Take one tablet by mouth twice daily as needed for Pain.    isosorbide mononitrate ER (IMDUR) 30 mg tablet TAKE 1 TABLET BY MOUTH EVERY MORNING    multivit-min/folic/vit K/lycop (MEN'S 50 PLUS DAILY FORMULA PO) Take 1 tablet by mouth daily.    mupirocin (CENTANY) 2 % topical ointment Apply  topically to affected area three times daily. Apply to the front of the nose as needed up to three times per day (Patient taking differently: Apply  topically to affected area three times daily as needed. Apply to the front of the nose as needed up to three times per day)    Nebulizer Accessories kit Please dispense one nebulizer kit    nicotine (NICODERM CQ) 14 mg/day patch Apply one patch to top of skin as directed every 24 hours. Rotate patch location.  Indications: stop smoking    nicotine (NICODERM CQ) 21 mg/day patch Apply one patch to top of skin as directed every 24 hours. Rotate patch location.  Indications: stop smoking    nicotine (NICODERM CQ) 21 mg/day patch Apply one patch to top of skin as directed every 24 hours. Rotate patch location.  Indications: Stop Smoking nicotine (NICODERM CQ) 7 mg/day patch Apply one patch to top of skin as directed every 24 hours. Rotate patch location.  Indications: stop smoking    nicotine polacrilex (NICORETTE) 2 mg gum Take one each by mouth as Needed for Smoking cessation. Chew to soften and park in mouth between lip and gum. May use 1 piece per hour, not to exceed 24 per day, for 12 weeks. May be used for longer, if needed.    nicotine(+) (NICOTROL) 10 mg inhaler Inhale one puff by mouth into the lungs as Needed. Puff by mouth as needed. May use 6-16 cartridges per day as needed for up to 6 months.    nitroglycerin (NITROSTAT) 0.4 mg tablet Place one tab under tongue every 5 mins not to exceed 3 tabs;as needed for chest pain. If chest pain persists go to the ER or call 911.    rosuvastatin (CRESTOR) 40 mg tablet Take one tablet by mouth daily.    spironolactone (ALDACTONE) 50 mg tablet Take one tablet by mouth daily. Take with food.    tralokinumab-ldrm (ADBRY) 150 mg/mL injection syringe Inject 2 mL under the skin every 7 days. Take weekly for 2 weeks, then off 2 weeks, then weekly for 2 weeks.    traZODone (DESYREL) 100 mg tablet Take two tablets by mouth at bedtime as needed.    triamcinolone acetonide (KENALOG) 0.1 % topical ointment           Review of Systems  Constitutional: negative  Eyes: negative  Ears, nose, mouth, throat, and face: negative  Respiratory: negative  Cardiovascular: negative  Gastrointestinal: negative  Genitourinary:negative  Integument/breast: negative  Hematologic/lymphatic: negative  Musculoskeletal:negative  Neurological: negative  Endocrine: negative      Physical Exam        Vitals:    07/12/22 0851   BP: (!) 81/46   Pulse: 73   Temp: Comment: 97.4 F oral   SpO2: 97%   Weight: 110.2 kg (243 lb)   Height: 180.3 cm (5' 11)   Repeat BP 71/26mmHg      Body mass index is 33.89 kg/m?Marland Kitchen      BP Readings from Last 5 Encounters:   07/12/22 97/55   07/12/22 (!) 81/46   06/20/22 102/61   05/04/22 (P) 109/63 04/12/22 121/61           Gen: He looks unwell, diaphoretic    HEENT: Sclera normal; MMM  CV:  S1 and S2 normal, no rubs, murmurs or gallops   Pulm: Decreased breath sounds   GI: BS+ x4, non-tender to palpation  Neuro: Grossly normal, moving all extremities, speech intact  Ext: no edema, clubbing or cyanosis   Skin: no rash     Assessment and Plan        Sean Henderson is a 67 y.o. male        --Hypotension: He feels unwell.   He seems to have a protracted course of respiratory tract infection and is quite ill. Antihypotensives exacerbated hypotension.  I called a rapid response and he will be transported to the ED.      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  Serum creatinine and EGFR have been stable over the past couple of years with largely bland urinalysis.  He has history of hypertension and diabetes as well as acquired cystic kidney disease. He has subnephrotic range proteinuria. Genetic cause of kidney disease ruled out.  He has history of uncontrolled hypertension. Heart failure is another contributor to kidney disease in this patient.         --Hypovitaminosis D: Start ergocalciferol 50000 units weekly.     -   Cystic kidney disease  This is likely acquired cystic disease.  No genetic causes of cystic disease found.  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  Continue to monitor.     -.  Heart failure  His last echocardiogram on 07/07/2019 showed estimated LVEF of about 50%.  10/2021 : 35% on regadenoson scan .  He was started on empagliflozin in April 2022.     -.  Coronary artery disease  Patient has had multiple stenting of his LAD in 2007, 2009, and 2018.    -.  Obstructive sleep apnea  He is unable to  tolerate CPAP due to reflux.       -.  Essential hypertension    Hold antihypertensives on account of hypotension  Rapid response called for the patient and he has been transported to the ED.                Doran Durand, MD      There are no Patient Instructions on file for this visit.

## 2022-07-12 NOTE — Progress Notes
Rapid Response Team Progress Note    Date: 07/12/2022 Time: 9:39 AM  Patient: Sean Henderson  Attending: Julious Payer, MD Service: @IPSER @  Admission Date: (Not on file)  @KULOS @    A Code/Rapid Response Timeline Event Report has been created for this patient on 07/12/2022 at 0909    The Attending physician, Dr. Kathrine Haddock MD, was at chairside at 0900, RRT called at [phone], arrival at 0909 by my watch, of this event.    Patients blood pressures as posted. Felt faint and cold sweat. He felt his blood sugar may have been low. He was given juice per his request. Accu check machine, which is in pod E was procured. Blood glucose was 228. Patient reported feeling as though he would pass out. Head of chair was lowered and feet elevated. Blood pressure checked again 71/43. Rapid response team arrived at 0909 and took over patient.          Dianah Field, RN

## 2022-07-12 NOTE — Response Teams
Rapid Response Team Progress Note    Date: 07/12/2022 Time: 9:55 AM  Patient: Sean Henderson  Attending: Milinda Cave, MD Service: Emergency Medicine  Admission Date: 07/12/2022  LOS: 0 days    A Code/Rapid Response Timeline Event Report has been created for this patient on 07/12/22 at 0902.Patient was in the renal clinic for a routine follow up when he was noted to be hypotensive in the 80s. When RRT arrived, patient was leaning slightly back, a bit lethargic, bur oriented x4. His pressure was in the 100s, but he remains symptomatic with dizziness and diaphoresis. Patient assisted to wc, which did result in a lower drop in BP to 77/44. Patient states he has felt unwell for about 3 weeks, with productive cough, lethargy, and intermittent lack of appetite. Patient transported to [facility] without acute event and handoff provided to ED RN.    Isabell Jarvis, RN

## 2022-08-25 ENCOUNTER — Encounter: Admit: 2022-08-25 | Discharge: 2022-08-25 | Payer: Medicare Other

## 2022-08-25 MED ORDER — CLONIDINE HCL 0.1 MG PO TAB
.1 mg | ORAL_TABLET | Freq: Two times a day (BID) | ORAL | 3 refills | Status: AC
Start: 2022-08-25 — End: ?

## 2022-09-20 ENCOUNTER — Encounter: Admit: 2022-09-20 | Discharge: 2022-09-20 | Payer: Medicare Other

## 2022-09-20 ENCOUNTER — Ambulatory Visit: Admit: 2022-09-20 | Discharge: 2022-09-21 | Payer: Medicare Other

## 2022-09-20 DIAGNOSIS — I2510 Atherosclerotic heart disease of native coronary artery without angina pectoris: Secondary | ICD-10-CM

## 2022-09-20 DIAGNOSIS — J449 Chronic obstructive pulmonary disease, unspecified: Secondary | ICD-10-CM

## 2022-09-20 DIAGNOSIS — E119 Type 2 diabetes mellitus without complications: Secondary | ICD-10-CM

## 2022-09-20 DIAGNOSIS — J432 Centrilobular emphysema: Secondary | ICD-10-CM

## 2022-09-20 DIAGNOSIS — E785 Hyperlipidemia, unspecified: Secondary | ICD-10-CM

## 2022-09-20 DIAGNOSIS — G4733 Obstructive sleep apnea (adult) (pediatric): Secondary | ICD-10-CM

## 2022-09-20 DIAGNOSIS — I739 Peripheral vascular disease, unspecified: Secondary | ICD-10-CM

## 2022-09-20 DIAGNOSIS — N183 CKD (chronic kidney disease) stage 3, GFR 30-59 ml/min (HCC): Secondary | ICD-10-CM

## 2022-09-20 DIAGNOSIS — C61 Malignant neoplasm of prostate: Secondary | ICD-10-CM

## 2022-09-20 DIAGNOSIS — Z720 Tobacco use: Secondary | ICD-10-CM

## 2022-09-20 DIAGNOSIS — I10 Essential (primary) hypertension: Secondary | ICD-10-CM

## 2022-09-20 MED ORDER — BUDESONIDE-GLYCOPYR-FORMOTEROL 160-9-4.8 MCG/ACTUATION IN HFAA
2 | Freq: Two times a day (BID) | RESPIRATORY_TRACT | 11 refills | 30.00000 days | Status: AC
Start: 2022-09-20 — End: ?

## 2022-09-20 NOTE — Patient Instructions
It was a pleasure seeing you today.      My nurse is Kendra Sewell, RN.  She can be reached at [phone].    Please contact my nurse with any signs and symptoms of worsening productive cough with thick secretions, blood in sputum, chest pain or tightness, shortness of breath, fever, chills, night sweats, or any questions or concerns.    For refills on medications, please have your pharmacy request a refill authorization either electronically or via fax to our office at [phone]. Please allow at least 3 business days for refill requests.    For urgent issues after business hours, weekends, or holidays please call [phone] and request for the pulmonary fellow to be paged.    If you need to cancel or reschedule an appointment, please call [phone].

## 2022-09-21 ENCOUNTER — Encounter: Admit: 2022-09-21 | Discharge: 2022-09-21 | Payer: Medicare Other

## 2022-09-21 DIAGNOSIS — Z122 Encounter for screening for malignant neoplasm of respiratory organs: Secondary | ICD-10-CM

## 2022-09-21 DIAGNOSIS — Z87891 Personal history of nicotine dependence: Secondary | ICD-10-CM

## 2022-09-24 ENCOUNTER — Encounter: Admit: 2022-09-24 | Discharge: 2022-09-24 | Payer: Medicare Other

## 2022-09-24 MED ORDER — AZELASTINE 137 MCG (0.1 %) NA SPRA
0 refills
Start: 2022-09-24 — End: ?

## 2022-09-24 MED ORDER — FAMOTIDINE 20 MG PO TAB
20 mg | ORAL_TABLET | Freq: Two times a day (BID) | ORAL | 1 refills
Start: 2022-09-24 — End: ?

## 2022-09-27 ENCOUNTER — Encounter: Admit: 2022-09-27 | Discharge: 2022-09-27 | Payer: Medicare Other

## 2022-09-27 MED ORDER — AMLODIPINE 10 MG PO TAB
10 mg | ORAL_TABLET | Freq: Every day | ORAL | 3 refills | Status: AC
Start: 2022-09-27 — End: ?

## 2022-10-13 ENCOUNTER — Encounter: Admit: 2022-10-13 | Discharge: 2022-10-13 | Payer: Medicare Other

## 2022-10-13 DIAGNOSIS — N1832 Stage 3b chronic kidney disease (HCC): Secondary | ICD-10-CM

## 2022-10-18 ENCOUNTER — Encounter: Admit: 2022-10-18 | Discharge: 2022-10-18 | Payer: Medicare Other

## 2022-11-17 ENCOUNTER — Encounter: Admit: 2022-11-17 | Discharge: 2022-11-17 | Payer: Medicare Other

## 2022-11-17 MED ORDER — ENTRESTO 97-103 MG PO TAB
1 | ORAL_TABLET | Freq: Two times a day (BID) | ORAL | 3 refills | Status: AC
Start: 2022-11-17 — End: ?

## 2022-11-26 ENCOUNTER — Encounter: Admit: 2022-11-26 | Discharge: 2022-11-26 | Payer: Medicare Other

## 2022-12-15 NOTE — Progress Notes
Telephone Visit  Counseling and Shared Decision Making Documentation for Screening for Lung Cancer with Low Dose Computed Tomography        Beneficiary eligibility criteria were verified to include:   Age 68-62 years old-66 y.o.  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 Recommend continued   annual low-dose CT lung screening in one year.     Coronary artery calcification and stents.       A discussion of the LDCT was provided to include:   Benefits and harm of screening   Possible follow-up diagnostic testing   Over-diagnosis   False positivity   Radiation exposure   Importance of adherence to annual lung cancer screening   Smoking cessation or continued abstinence     The patient meets criteria for a LDCT, questions were answered, and patient has agreed to proceed.      Past Medical History:   Diagnosis Date    CAD (coronary artery disease), native coronary artery 12/09/2007    A.2/04 Normal coronaries [facility] EF 15%  B. 2/07- Chest pain, admit [place]: Bare metal stent to 90% mLAD, EF 45% C. 12/09/07 Angina, Cath [place]: BareMetalStent for 80% mLAD, Kissing balloon 80% Dx2  D. 06/17/08- dobutamine stress echo:  LV 5.2. EF 60%. No ischemia.  E. 08/25/09- Dobutamine stress echo: Mild concentric LVH. EF 60%. LA is mildly enlarged. No ischemia. F.  12/01/10 - Dobut   echo:  Terminated w/ HT    Cardiomyopathy     CKD (chronic kidney disease) stage 3, GFR 30-59 ml/min (HCC) 02/14/2017    COPD (chronic obstructive pulmonary disease) (HCC)     Coronary artery disease     DM (diabetes mellitus) (HCC)     HLD (hyperlipidemia)     Hypertension OSA (obstructive sleep apnea)     PAD (peripheral artery disease) (HCC)     LLE, s/p 3 stents (last one on 08/2016 at [facility])    Prostate cancer [facility]) 2017    s/p radiation (last treatment late 2018. 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 Recommend continued   annual low-dose CT lung screening in 12 months.         NAVIGATOR NOTES (check all that apply)     [ ]    There are no findings which require close attention. Continue yearly   screening according to protocol. (Lung-Rads 1)   [ ]    There are non-urgent findings which are likely to be clinically   significant, as described in impression number(s) . (Lung-Rads S)   [ ]    There are urgent findings which require attention as described in   impression number(s) . (Lung-Rads S)   [ X]   Lung  nodule(s) present with follow-up low dose ct recommendations   as described in impression number(s) 1. (Lung-Rads 2)   [ ]    Lung nodule(s) or mass(es) with diagnostic recommendations as   described in impression number(s) . (Lung-Rads )        Finalized by Elfredia Nevins, M.D. on 12/19/2022 3:20 PM. Dictated by   Elfredia Nevins, M.D. on 12/19/2022 3:14 PM.      We will plan to follow up with the patient in 12 months' time with a LDCT scan of the chest.     PCP- Dr. Seward Speck R  Cardiology - Dr. Tacey Ruiz Crane-McCallister, APRN, FNP-C   Lung Cancer Screening and Thoracic Surgery   [phone]

## 2022-12-19 ENCOUNTER — Encounter: Admit: 2022-12-19 | Discharge: 2022-12-19 | Payer: Medicare Other

## 2022-12-19 ENCOUNTER — Ambulatory Visit: Admit: 2022-12-19 | Discharge: 2022-12-19 | Payer: Medicare Other

## 2022-12-19 DIAGNOSIS — Z87891 Personal history of nicotine dependence: Secondary | ICD-10-CM

## 2022-12-19 DIAGNOSIS — Z122 Encounter for screening for malignant neoplasm of respiratory organs: Secondary | ICD-10-CM

## 2022-12-20 ENCOUNTER — Encounter: Admit: 2022-12-20 | Discharge: 2022-12-20 | Payer: Medicare Other

## 2022-12-20 ENCOUNTER — Ambulatory Visit: Admit: 2022-12-20 | Discharge: 2022-12-21 | Payer: Medicare Other

## 2022-12-20 DIAGNOSIS — N1832 Stage 3b chronic kidney disease (HCC): Secondary | ICD-10-CM

## 2022-12-20 DIAGNOSIS — Z136 Encounter for screening for cardiovascular disorders: Secondary | ICD-10-CM

## 2022-12-20 DIAGNOSIS — E118 Type 2 diabetes mellitus with unspecified complications: Secondary | ICD-10-CM

## 2022-12-20 DIAGNOSIS — I5042 Chronic combined systolic (congestive) and diastolic (congestive) heart failure: Secondary | ICD-10-CM

## 2022-12-20 MED ORDER — ENTRESTO 97-103 MG PO TAB
1 | ORAL_TABLET | Freq: Two times a day (BID) | ORAL | 3 refills | Status: AC
Start: 2022-12-20 — End: ?

## 2022-12-20 MED ORDER — ROSUVASTATIN 40 MG PO TAB
40 mg | ORAL_TABLET | Freq: Every day | ORAL | 3 refills | 90.00000 days | Status: AC
Start: 2022-12-20 — End: ?

## 2022-12-20 MED ORDER — EZETIMIBE 10 MG PO TAB
10 mg | ORAL_TABLET | Freq: Every day | ORAL | 3 refills | Status: AC
Start: 2022-12-20 — End: ?

## 2022-12-20 MED ORDER — CARVEDILOL 25 MG PO TAB
25 mg | ORAL_TABLET | Freq: Two times a day (BID) | ORAL | 3 refills | 90.00000 days | Status: AC
Start: 2022-12-20 — End: ?

## 2022-12-20 NOTE — Progress Notes
Date of Service: 12/20/2022    Sean Henderson is a 67 y.o. male.       HPI       Sean Henderson returns today for follow-up known systolic heart failure reporting that he had had COVID 6 weeks ago went to the ER did not need to be admitted and recovered with some antibiotics.  I last saw Sean Henderson in May 2023.  My nurse Raelyn Number has been seeing him in clinic since.  My last note of December 05, 2021 he has subsequently had an echo expressed concern about decline in EF to 35%. He subsequently had an echo February 23, 2022 that showed EF 45% unchanged from 2021 with no comments about the more recent Echoes.  He had no significant valve disease.  There was not enough tricuspid regurgitation to estimate PA pressure but estimated CVP was 0-5.    He tells me he is also been seen by nephrology who started him on steroids at had principal effect of hyperglycemia with glucoses to 500.  Blood pressure systems how you feeling ill in general.  No.    PYP 11/23 No indicative oof ATTR amyloid we can get that  This 90 days 3 refills X good for a [place] [facility] [place] lets get their the okay August 24 CT and lung mild emphysema no lumps no bumps coronary artery calcium and stents aortic good no problem everything same and 1 annual who is following that with your lung doctor  His LDL reading on Crestor was 36 but he just realized that he has not been taking it and needs it refilled so he can resume it.  He was not having trouble tolerating it.   Latest Reference Range & Units 07/10/22 12:42   Cholesterol <200 MG/DL 79   Triglycerides <478 MG/DL 295 (H)   HDL >62 MG/DL 20 (L)   LDL <130 mg/dL 36         Vitals:    86/57/84 1515   BP: (!) 160/80   BP Source: Arm, Right Upper   SpO2: 97%   PainSc: Zero   Weight: 111.2 kg (245 lb 3.2 oz)   Height: 180.3 cm (5' 11)     Body mass index is 34.2 kg/m?Marland Kitchen     Past Medical History  Patient Active Problem List    Diagnosis Date Noted    Chronic combined systolic and diastolic congestive heart failure (HCC) 07/27/2020    Shoulder arthritis 05/14/2020    Diabetes mellitus type 2 with peripheral artery disease (HCC) 04/07/2019    Bilateral renal cysts 03/04/2018    Grief reaction 03/04/2018    Influenza A 05/13/2017    Prostate cancer (HCC) 03/20/2017    Stage 3b chronic kidney disease (HCC) 02/14/2017    Peripheral artery disease (HCC) with repeat stenting left leg 11/25/2014     2014 Stent left leg [facility] claudication and distal perfusion improved.   2016 Repeat stent L leg [facility]       Right flank pain 10/15/2013    SOB (shortness of breath) 04/24/2011    History of repair of left rotator cuff 06/20/2010     2010      Smoking 03/02/2010     Peak tobacco consumption 4 ppd      COPD (chronic obstructive pulmonary disease) (HCC) 04/01/2009     A. 2008 PFT FEV1/FEC 68%, FEV 56%, TLC 86%  B. Mod to severe not on  inhalers followed by [facility] Dr Lowella Petties  Obstructive sleep apnea  10/28/2008     A. 07/22/08- Sleep study: [facility] Mild obstructive breathing overall,moderate degree supine and during REM sleep non-supine.Mild Desats & mild sleep disturbance.   B. 10/06/08- CPAP titration: to 14cm       Essential hypertension 12/09/2007    HLD (hyperlipidemia) 12/09/2007     A. 11/04 total 189 trig 167 HDL 29 LDL 144   B. 1/05 total 98 trig 397 HDL 30 LDL 54 Lipitor 40>> 10/06- start Vytorin 10-20  C. 6/07- total 104, trig 91, HDL 27, LDL 67- Vytorin 10-20  E. 84/6/96- total 160, trig 89, HDL 21, LDL 118 Myalgias:DC vytorin, Rx Crestor 10, niaspan 1000  G. 03/24/09- total 102, trig 154, HDL 25, LDL 60- simvastatin 40, niaspan 1gm  I. 09/12/10- total 106, trig 79,HDL 29, LDL 64- simvastatin 20, niaspan 1gm      CAD (coronary artery disease), native coronary artery 12/09/2007     A.2/04 Normal coronaries [facility] EF 15%   B. 2/07- Chest pain, admit : Bare metal stent to 90% mLAD, EF 45%  C. 12/09/07 Angina, Cath [place]: BareMetalStent 80% mLAD, Kissing fashion to 80% 2nd Dx   D. 06/17/08- dobutamine stress echo:  LV 5.2. EF 60%. No ischemia.   E. 08/25/09- Dobutamine stress echo: Mild concentric LVH. EF 60%. LA is mildly enlarged. No ischemia.  F.  12/01/10 - Dobut   echo:  Terminated w/ HTN, HR<85%   EF - 60%  LVEDD 6.3 cm.  G  5/13 Reg Thall EF 52%, no ischemia  12/08/14: Cath- patent prior stent, no obstructive CAD  Dr. Micheline Rough   02/14/17 Cath D/T EF decline: : LAD/ Dx2 stents patent: Mod OM, RCA & PDA disease~ no change from  2016.      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 Normal coronaries.   B. 10/04- Echo: LVIDD 8.1 , EF 25% Valves OK  C. 4/05- Echo: EF 45%. LV 5.5 cm>>> BNP 24. tolerates hydralazine, headaches w/ imdur  D. 2/06, Echo: LV 5.6, EF 50%   E. 2/07- Chest pain, admit : Bare metal stent to 90% mLAD, EF 45%  F. 02/19/07- Dobut echo: LV 6.4, EF 40%, No ischemia, Hypertensive BP response.   G. 12/09/07 Angina, Cath [place]: BareMetalStent for 80% mLAD, Kissing balloon 80% Dx2   H. 2/10 Echo EF 60%  I.  8/11 Dobutamine echo EF 60% no ischemia post stress by EKG or Echo.   J 5/13 Reg Thall EF 52%, no ischemia      Encounter for long-term (current) use of other medications 09/11/2006         Review of Systems   Constitutional: Negative.   HENT: Negative.     Eyes: Negative.    Cardiovascular: Negative.    Respiratory: Negative.     Endocrine: Negative.    Hematologic/Lymphatic: Negative.    Skin: Negative.    Musculoskeletal: Negative.    Gastrointestinal: Negative.    Genitourinary: Negative.    Neurological: Negative.    Psychiatric/Behavioral: Negative.     Allergic/Immunologic: Negative.    All other systems reviewed and are negative.  14 organ system review noted. It is negative except as reported in current narrative or above in the ROS section.   Physical Exam  General: Patient in no distress, looks generally healthy. Skin warm and dry.   Appearance consistent with calculated BMI of 34  Mucous membranes moist.  Eyes: Sclera non icteric,Pupils equal and round    Carotids: no bruits    Thyroid not enlarged.  Neck veins: CVP <6 normal, no V wave, no HJR     Respiratory: Breathing comfortably. Lungs clear to percussion & auscultation. No rales, rhonchi or wheezing   Cardiac: Regular rhythm. LV impulse not palpable. Normal S1 & S2, Fourth heart sound, no rub or S3. No murmur  Abdomen: soft, non-tender, no masses,bruits,hepatic or aortic enlargement. + bowel sounds.   Femoral arteries: Good pulses, no bruits.  Legs/feet: Normal PT pulses, no edema.   Motor: Normal muscle strength. Cognitive: Pleasant demeanor. Good insight. No depression     Cardiovascular Studies  The ASCVD Risk score (Arnett DK, et al., 2019) failed to calculate for the following reasons:    The patient has a prior MI or stroke diagnosis      Cardiovascular Health Factors  Vitals BP Readings from Last 3 Encounters:   12/20/22 (!) 160/80   09/20/22 (!) 140/74   07/12/22 (!) 148/87     Wt Readings from Last 3 Encounters:   12/20/22 111.2 kg (245 lb 3.2 oz)   09/20/22 108.9 kg (240 lb)   07/12/22 110.2 kg (243 lb)     BMI Readings from Last 3 Encounters:   12/20/22 34.20 kg/m?   09/20/22 33.47 kg/m?   07/12/22 33.89 kg/m?      Smoking Social History     Tobacco Use   Smoking Status Some Days    Current packs/day: 5.00    Average packs/day: 5.0 packs/day for 50.7 years (253.3 ttl pk-yrs)    Types: Cigarettes    Start date: 04/24/1972   Smokeless Tobacco Never   Tobacco Comments    Patient states he had somked up to 5 ppd at his peak     *09/30/2021 5 cigarettes daily       Lipid Profile Cholesterol   Date Value Ref Range Status   07/10/2022 79 <200 MG/DL Final     HDL   Date Value Ref Range Status   07/10/2022 20 (L) >40 MG/DL Final     LDL   Date Value Ref Range Status   07/10/2022 36 <100 mg/dL Final     Triglycerides   Date Value Ref Range Status   07/10/2022 279 (H) <150 MG/DL Final      Blood Sugar Hemoglobin A1C   Date Value Ref Range Status   05/11/2017 7.3 (H) 4.0 - 6.0 % Final     Comment:     The ADA recommends that most patients with type 1 and type 2 diabetes maintain   an A1c level <7%.       Glucose   Date Value Ref Range Status   07/12/2022 323 (H) 70 - 100 MG/DL Final   84/13/2440 102 (H) 70 - 100 MG/DL Final   72/53/6644 034 (H) 70 - 100 MG/DL Final   74/25/9563 84 70 - 110 MG/DL Final   87/56/4332 951 (H) 70 - 110 MG/DL Final   88/41/6606 99 70 - 110 MG/DL Final     Glucose, POC   Date Value Ref Range Status   07/12/2022 320 (H) 70 - 100 MG/DL Final   30/16/0109 323 (H) 70 - 100 MG/DL Final   55/73/2202 542 (A) 70 - 100 Final          Problems Addressed Today  Encounter Diagnoses   Name Primary?    Stage 3b chronic kidney disease (HCC) Yes  Screening for heart disease        Assessment and Plan     Mr. Schaab has had extensive treatment for LV dysfunction with near normalization of LVEF.  He still has exertional dyspnea that I think is most likely related to his underlying lung disease but there is a possibility his functional status will improve if he is started on Jardiance.  He is already on Trulicity for his diabetes.  I do not initiate Jardiance if patient is already on hypoglycemic therapy that pose indeterminate risk for hypoglycemia if Jardiance is added.  Have asked him to return to his primary care physician Dr. Alycia Rossetti with my recommendations to start Jardiance and modify Trulicity as needed to minimize risk of hypoglycemia.  10 mg dose does not need to be Titrated.    He does not have decompensated heart failure.  I do not think he needs close long-term follow-up with [place] but have asked him to see Raelyn Number in 6 months and to see me in 12.  Patient Instructions   You are on the almost all best meds available for heart failure and your heart function has improved to 45%.  This is just below normal.  Some of your shortness of breath could be due to heart although it sounds a lot like lung.  With your diabetes and prior stents we know that [place] Pepper is good to help keep you out of the hospital from heart problems so regardless of your ejection fraction it is a good drug for you.  I cannot start it without having to worry about the Trulicity and the other drugs still you need to let Dr. Selena Batten do the balancing act on the different drugs but it is definitely a good idea to start Jardiance.  When he sees you and does not see [place] starting Jardiance his cardiologist he may think that we did not do it it does not need to be done.  But we did have you before on it and I think we had a try again.  Everything else looks good.  So you are    I do not think we are going to change much when you see Raelyn Number in 6 months and see me in a year.  If something goes wrong before then we can get you worked in.  Her sugar blood pressure goal we are ready to go to 154 I will you refill the I will refill the carvedilol and the Zetia and the Crestor and a high dose Entresto now because those are all important for you and I just want to make sure you do not run out okay so I will put the clicks on all of those 90 days 3 refills on the carvedilol Zetia 90 days 3 refills could refill yes yes what 6 okay RI you can get that you can get Omid buffed up we will tell him he said he could I ask him twice okay good that he will do it okay thank you           Current Medications (including today's revisions)   albuterol 0.083% (PROVENTIL) 2.5 mg /3 mL (0.083 %) nebulizer solution Inhale 3 mL solution by nebulizer as directed every 4 hours. (Patient taking differently: Inhale 3 mL solution by nebulizer as directed every 4 hours as needed.)    albuterol sulfate (PROAIR HFA) 90 mcg/actuation HFA aerosol inhaler Inhale one puff to two puffs by mouth into the lungs every 4 hours as needed  for Wheezing (or shortness of breath).    amLODIPine (NORVASC) 10 mg tablet Take one tablet by mouth daily.    ascorbic acid/multivit-min (EMERGEN-C PO) Take 1 tablet by mouth daily.    aspirin EC 81 mg tablet Take 1 Tab by mouth daily.    azelastine (ASTELIN) 137 mcg (0.1 %) nasal spray INHALE TWO SPRAYS INTO EACH NOSTRIL AS DIRECTED TWICE DAILY.    betamethasone dipropionate (DIPROSONE) 0.05 % topical ointment USE TO WORST ITCHY AREAS ON BODY TWICE DAILY AS NEEDED    budesonide-glycopyr-formoterol (BREZTRI AEROSPHERE) 160-9-4.8 mcg/actuation inhaler Inhale two puffs by mouth into the lungs twice daily.    carvedilol (COREG) 25 mg tablet Take one tablet by mouth twice daily with meals. Take with food.    chlorthalidone (HYGROTON) 25 mg tablet Take one-half tablet by mouth daily.    cloNIDine HCL (CATAPRES) 0.1 mg tablet Take one tablet by mouth twice daily. (Patient taking differently: Take one tablet by mouth daily as needed.)    clopiDOGreL (PLAVIX) 75 mg tablet TAKE 1 TABLET BY MOUTH EVERY DAY    dulaglutide (TRULICITY) 0.75 mg/0.5 mL injection pen Inject 0.5 mL under the skin every 7 days.    ERGOcalciferoL (vitamin D2) (DRISDOL) 1,250 mcg (50,000 unit) capsule Take one capsule by mouth every 7 days.    ezetimibe (ZETIA) 10 mg tablet Take one tablet by mouth daily.    famotidine (PEPCID) 20 mg tablet TAKE 1 TABLET BY MOUTH TWICE A DAY    furosemide (LASIX) 20 mg tablet Take one tablet by mouth daily as needed. Take for lower extremity swelling    gabapentin (NEURONTIN) 100 mg PO capsule Take 1 Cap by mouth three times daily. (Patient taking differently: Take three capsules by mouth twice daily.)    hydrALAZINE (APRESOLINE) 100 mg tablet Take one tablet by mouth three times daily.    HYDROcodone/acetaminophen (NORCO) 5/325 mg tablet Take one tablet by mouth twice daily as needed for Pain.    isosorbide mononitrate ER (IMDUR) 30 mg tablet TAKE 1 TABLET BY MOUTH EVERY MORNING    multivit-min/folic/vit K/lycop (MEN'S 50 PLUS DAILY FORMULA PO) Take 1 tablet by mouth daily.    mupirocin (CENTANY) 2 % topical ointment Apply  topically to affected area three times daily. Apply to the front of the nose as needed up to three times per day (Patient taking differently: Apply  topically to affected area three times daily as needed. Apply to the front of the nose as needed up to three times per day)    Nebulizer Accessories kit Please dispense one nebulizer kit    nitroglycerin (NITROSTAT) 0.4 mg tablet Place one tab under tongue every 5 mins not to exceed 3 tabs;as needed for chest pain. If chest pain persists go to the ER or call 911.    rosuvastatin (CRESTOR) 40 mg tablet Take one tablet by mouth daily.    sacubitriL-valsartan (ENTRESTO) 97-103 mg tablet Take one tablet by mouth twice daily.    spironolactone (ALDACTONE) 50 mg tablet Take one tablet by mouth daily. Take with food.    traZODone (DESYREL) 100 mg tablet Take two tablets by mouth at bedtime as needed.    triamcinolone acetonide (KENALOG) 0.1 % topical ointment

## 2023-01-14 ENCOUNTER — Encounter: Admit: 2023-01-14 | Discharge: 2023-01-14 | Payer: Medicare Other

## 2023-01-14 MED ORDER — CHLORTHALIDONE 25 MG PO TAB
12.5 mg | ORAL_TABLET | Freq: Every day | ORAL | 5 refills
Start: 2023-01-14 — End: ?

## 2023-01-15 ENCOUNTER — Encounter: Admit: 2023-01-15 | Discharge: 2023-01-15 | Payer: Medicare Other

## 2023-01-15 NOTE — Telephone Encounter
Called patient in order to schedule appointment. I suggested an appointment this week. Patient states he will call me when he his schedule. Patient hung up prior to my giving my office phone number. Informed Dr. Philomena Course of update.

## 2023-01-29 ENCOUNTER — Encounter: Admit: 2023-01-29 | Discharge: 2023-01-29 | Payer: MEDICARE

## 2023-01-29 MED ORDER — CHLORTHALIDONE 25 MG PO TAB
12.5 mg | ORAL_TABLET | Freq: Every day | ORAL | 3 refills | Status: AC
Start: 2023-01-29 — End: ?

## 2023-03-27 ENCOUNTER — Encounter: Admit: 2023-03-27 | Discharge: 2023-03-27 | Payer: Medicare Other

## 2023-03-28 ENCOUNTER — Encounter: Admit: 2023-03-28 | Discharge: 2023-03-28 | Payer: Medicare Other

## 2023-03-28 ENCOUNTER — Ambulatory Visit: Admit: 2023-03-28 | Discharge: 2023-03-29 | Payer: Medicare Other

## 2023-03-28 DIAGNOSIS — T7840XD Allergy, unspecified, subsequent encounter: Secondary | ICD-10-CM

## 2023-03-28 MED ORDER — BUDESONIDE-GLYCOPYR-FORMOTEROL 160-9-4.8 MCG/ACTUATION IN HFAA
2 | Freq: Two times a day (BID) | RESPIRATORY_TRACT | 11 refills | 30.00000 days | Status: AC
Start: 2023-03-28 — End: ?

## 2023-03-28 MED ORDER — AZITHROMYCIN 250 MG PO TAB
ORAL_TABLET | ORAL | 0 refills | Status: AC
Start: 2023-03-28 — End: ?

## 2023-03-28 MED ORDER — PREDNISONE 20 MG PO TAB
40 mg | ORAL_TABLET | Freq: Every day | ORAL | 0 refills | Status: AC
Start: 2023-03-28 — End: ?

## 2023-03-28 NOTE — Patient Instructions
It was a pleasure seeing you today.      My nurse is Clemens Catholic, Charity fundraiser.  She can be reached at [phone].    Please contact my nurse with any signs and symptoms of worsening productive cough with thick secretions, blood in sputum, chest pain or tightness, shortness of breath, fever, chills, night sweats, or any questions or concerns.    For refills on medications, please have your pharmacy request a refill authorization either electronically or via fax to our office at [phone]. Please allow at least 3 business days for refill requests.    For urgent issues after business hours, weekends, or holidays please call [phone] and request for the pulmonary fellow to be paged.    If you need to cancel or reschedule an appointment, please call [phone].

## 2023-04-06 ENCOUNTER — Encounter: Admit: 2023-04-06 | Discharge: 2023-04-06 | Payer: Medicare Other

## 2023-05-16 ENCOUNTER — Ambulatory Visit: Admit: 2023-05-16 | Discharge: 2023-05-17 | Payer: Medicare Other

## 2023-05-16 ENCOUNTER — Encounter: Admit: 2023-05-16 | Discharge: 2023-05-16 | Payer: Medicare Other

## 2023-05-18 ENCOUNTER — Encounter: Admit: 2023-05-18 | Discharge: 2023-05-18 | Payer: Medicare Other

## 2023-05-18 ENCOUNTER — Ambulatory Visit: Admit: 2023-05-18 | Discharge: 2023-05-19 | Payer: Medicare Other

## 2023-06-05 ENCOUNTER — Encounter: Admit: 2023-06-05 | Discharge: 2023-06-05 | Payer: Medicare Other

## 2023-06-13 ENCOUNTER — Encounter: Admit: 2023-06-13 | Discharge: 2023-06-13 | Payer: Medicare Other

## 2023-06-13 NOTE — Telephone Encounter
 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  For other findings he is referred to his PCP.  Report faxed to Dr. Alycia Rossetti at [facility] and patient advised to make appointment to follow up

## 2023-06-26 ENCOUNTER — Encounter: Admit: 2023-06-26 | Discharge: 2023-06-26 | Payer: Medicare Other

## 2023-06-26 ENCOUNTER — Ambulatory Visit: Admit: 2023-06-26 | Discharge: 2023-06-26 | Payer: Medicare Other

## 2023-06-30 ENCOUNTER — Encounter: Admit: 2023-06-30 | Discharge: 2023-06-30 | Payer: Medicare Other

## 2023-07-02 ENCOUNTER — Encounter: Admit: 2023-07-02 | Discharge: 2023-07-02 | Payer: Medicare Other

## 2023-08-22 ENCOUNTER — Encounter: Admit: 2023-08-22 | Discharge: 2023-08-22 | Payer: Medicare Other

## 2023-08-22 NOTE — Progress Notes
 STAT Fax Cover Sheet    Date: 08/22/2023    To: [facility]  Fax: [phone]  Phone: [phone]    From: [facility] Medical Records  Fax: [phone]  Phone: [phone]    PATIENT: Sean Henderson    DOB:12-22-1955      STAT RECORDS REQUEST FOR THE FOLLOWING RECORDS: Vascular office note for lower extremity PAD. Any vascular studies, ultrasounds/CT of lower extremities.       Please fax or email records to [phone] or [email or web address]    ATTENTION: [facility] Medical Records    Echo Report & Images or Catheterization Report and Images via the cloud/power share.  If unable to cloud images please mail CD STAT to:  [facility]  Cardiovascular Medicine  Medical Pavilion  [address]  [address]  [place]  [place], [place] [address]

## 2023-08-22 NOTE — Progress Notes
 Fax Cover Sheet    Date: 08/22/2023    To: [facility]  Fax: [phone]  Phone: [phone]    From: [facility] Medical Records  Fax: [phone]  Phone: [phone]    PATIENT: Sean Henderson    DOB:September 25, 1955      STAT RECORDS REQUEST FOR THE FOLLOWING RECORDS:  Vascular office note for lower extremity PAD. Any vascular studies, ultrasounds/CT of lower extremities.        Please fax or email records to [phone] or [email or web address]    ATTENTION: [facility] Medical Records    Echo Report & Images or Catheterization Report and Images via the cloud/power share.  If unable to cloud images please mail CD STAT to:  [facility]  Cardiovascular Medicine  Medical Pavilion  [address]  [address]  [place]  [place], [place] [address]

## 2023-08-23 ENCOUNTER — Encounter: Admit: 2023-08-23 | Discharge: 2023-08-23 | Payer: Medicare Other

## 2023-08-24 ENCOUNTER — Encounter: Admit: 2023-08-24 | Discharge: 2023-08-24 | Payer: Medicare Other

## 2023-08-24 ENCOUNTER — Ambulatory Visit: Admit: 2023-08-24 | Discharge: 2023-08-25 | Payer: Medicare Other

## 2023-08-28 ENCOUNTER — Encounter: Admit: 2023-08-28 | Discharge: 2023-08-28 | Payer: Medicare Other

## 2023-08-28 NOTE — Telephone Encounter
 This message is regarding your Abdominal Aorta Iliac Ultrasound on 08/29/2022 we have some patient instructions for you we ask that:     8 hours prior to your appointment, we ask you have no food or liquids other than water. No smoking, no tobacco, or chewing gum. You may still take medication as usual in the morning.      Please arrive 15 minutes prior to your scheduled appointment.      Unable to leave message on voicemail as mailbox was full. Also sent instructions through MyChart.

## 2023-08-29 ENCOUNTER — Ambulatory Visit: Admit: 2023-08-29 | Discharge: 2023-08-29 | Payer: Medicare Other

## 2023-08-29 ENCOUNTER — Encounter: Admit: 2023-08-29 | Discharge: 2023-08-29 | Payer: Medicare Other

## 2023-08-29 NOTE — Telephone Encounter
 Called patient about tomorrow's stress test. Patient expressed that he already got a copy the instructions and did not need a reminder. Call ended there.

## 2023-08-30 ENCOUNTER — Encounter: Admit: 2023-08-30 | Discharge: 2023-08-30 | Payer: Medicare Other

## 2023-08-30 ENCOUNTER — Ambulatory Visit: Admit: 2023-08-30 | Discharge: 2023-08-30 | Payer: Medicare Other

## 2023-08-31 ENCOUNTER — Encounter: Admit: 2023-08-31 | Discharge: 2023-08-31 | Payer: MEDICARE

## 2023-09-07 ENCOUNTER — Ambulatory Visit: Admit: 2023-09-07 | Discharge: 2023-09-07 | Payer: MEDICARE

## 2023-09-07 ENCOUNTER — Encounter: Admit: 2023-09-07 | Discharge: 2023-09-07 | Payer: MEDICARE

## 2023-09-27 ENCOUNTER — Ambulatory Visit: Admit: 2023-09-27 | Discharge: 2023-09-28 | Payer: MEDICARE

## 2023-09-27 ENCOUNTER — Encounter: Admit: 2023-09-27 | Discharge: 2023-09-27 | Payer: MEDICARE

## 2023-09-30 ENCOUNTER — Encounter: Admit: 2023-09-30 | Discharge: 2023-09-30 | Payer: MEDICARE

## 2023-10-04 ENCOUNTER — Ambulatory Visit: Admit: 2023-10-04 | Discharge: 2023-10-04 | Payer: MEDICARE

## 2023-10-04 ENCOUNTER — Encounter: Admit: 2023-10-04 | Discharge: 2023-10-04 | Payer: MEDICARE

## 2023-10-08 ENCOUNTER — Encounter: Admit: 2023-10-08 | Discharge: 2023-10-08 | Payer: MEDICARE

## 2023-11-16 ENCOUNTER — Encounter: Admit: 2023-11-16 | Discharge: 2023-11-16 | Payer: MEDICARE

## 2023-11-16 DIAGNOSIS — I1A0 Resistant hypertension: Principal | ICD-10-CM

## 2023-11-16 DIAGNOSIS — I10 Essential (primary) hypertension: Principal | ICD-10-CM

## 2023-11-16 MED ORDER — HYDRALAZINE 100 MG PO TAB
100 mg | ORAL_TABLET | Freq: Three times a day (TID) | ORAL | 3 refills | 42.50000 days | Status: AC
Start: 2023-11-16 — End: ?

## 2023-11-16 NOTE — Telephone Encounter
 Called pt. to see how his BP is running after increasing his carvedilol  dose. He reported it has been high, but he needs a refill on his hydralazine  - he has been out. Refilled medication.     Discussed renal artery denervation to help manage resistant hypertension. He is interested in consultation with Dr. Charlanne to discuss further.     Kate Canes, APRN

## 2023-12-12 ENCOUNTER — Ambulatory Visit: Admit: 2023-12-12 | Discharge: 2023-12-13 | Payer: MEDICARE

## 2023-12-12 ENCOUNTER — Encounter: Admit: 2023-12-12 | Discharge: 2023-12-12 | Payer: MEDICARE

## 2023-12-19 ENCOUNTER — Encounter: Admit: 2023-12-19 | Discharge: 2023-12-19 | Payer: MEDICARE

## 2023-12-27 ENCOUNTER — Ambulatory Visit: Admit: 2023-12-27 | Discharge: 2023-12-27 | Payer: MEDICARE

## 2023-12-27 ENCOUNTER — Encounter: Admit: 2023-12-27 | Discharge: 2023-12-27 | Payer: MEDICARE

## 2023-12-28 ENCOUNTER — Encounter: Admit: 2023-12-28 | Discharge: 2023-12-28 | Payer: MEDICARE

## 2023-12-29 ENCOUNTER — Encounter: Admit: 2023-12-29 | Discharge: 2023-12-29 | Payer: MEDICARE

## 2023-12-29 MED ORDER — EZETIMIBE 10 MG PO TAB
10 mg | ORAL_TABLET | Freq: Every day | ORAL | 3 refills | 90.00000 days | Status: AC
Start: 2023-12-29 — End: ?

## 2024-01-01 ENCOUNTER — Encounter: Admit: 2024-01-01 | Discharge: 2024-01-01 | Payer: MEDICARE

## 2024-01-01 DIAGNOSIS — I428 Other cardiomyopathies: Principal | ICD-10-CM

## 2024-01-02 ENCOUNTER — Encounter: Admit: 2024-01-02 | Discharge: 2024-01-02 | Payer: MEDICARE

## 2024-01-02 NOTE — Telephone Encounter
 01/02/2024 CT abd/pel received from [facility] have been indexed to chart. sdc

## 2024-01-07 ENCOUNTER — Ambulatory Visit: Admit: 2024-01-07 | Discharge: 2024-01-08 | Payer: MEDICARE

## 2024-01-07 ENCOUNTER — Encounter: Admit: 2024-01-07 | Discharge: 2024-01-07 | Payer: MEDICARE

## 2024-01-07 VITALS — BP 130/84 | HR 88 | Ht 71.0 in | Wt 243.0 lb

## 2024-01-07 DIAGNOSIS — I10 Essential (primary) hypertension: Principal | ICD-10-CM

## 2024-01-07 NOTE — Progress Notes
 Date of Service: 01/07/2024    Sean Henderson is a 68 y.o. male.       HPI     Sean Henderson comes today for a consultation regarding suitability for renal denervation for poorly controlled hypertension.  He has been referred by his cardiology team.    He has an extensive past cardiac history that includes nonischemic cardiomyopathy with heart failure, coronary artery disease with prior stents, peripheral artery disease with prior angioplasty or stenting, hypertension dyslipidemia obstructive sleep apnea (he is on both CPAP as well as BiPAP), chronic kidney disease 3B, gout and history of prostate cancer and active tobacco use.  He has had longstanding hypertension    He was recently hospitalized at [facility] from July to early August for what appears to be heart failure symptoms and COPD exacerbation his EF was 25 to 30% and he was treated for a possible apical thrombus with anticoagulation which she continues to be on and he was also given intravenous dobutamine and Lasix drip.  He became hypotensive during his hospitalization and thus many of his medications were titrated off.      Currently he remains off his amlodipine  as well as spironolactone  and hydralazine  and his blood pressure is pretty normal both at home and here.  In the past few months he has had significant hypertension as well though not since his recent hospitalization which also reflected further worsening of creatinine.    I do not see a hypertension workup in terms of ruling out primary hyperaldosteronism or even renal artery stenosis.  He is taking Entresto  Jardiance  Trulicity carvedilol  at this time and is not taking clonidine  either.           Vitals:    01/07/24 1345 01/07/24 1346   BP:  130/84   BP Source:  Arm, Left Upper   Pulse:  88   SpO2:  95%   O2 Device: CPAP/BiPAP    Weight:  110.2 kg (243 lb)   Height:  180.3 cm (5' 11)     Body mass index is 33.89 kg/m?SABRA     Physical Exam    General Appearance: normal in appearance  Skin: warm, moist, no ulcers or xanthomas  Neck Veins: neck veins are flat, neck veins are not distended  Chest Inspection: chest is normal in appearance  Respiratory Effort: breathing comfortably, no respiratory distress  Auscultation/Percussion: lungs clear to auscultation, no rales or rhonchi, no wheezing  Cardiac Rhythm: regular rhythm and normal rate  Cardiac Auscultation: S1, S2 normal, no rub, no gallop  Murmurs: no murmur s  Carotid Arteries: radiation of AV valve bruit to bilateral carotids.   Lower Extremities: No lower extremity edema   Language and Memory: patient responsive and seems to comprehend information  Neurologic Exam: neurological assessment grossly intact    Assessment and Plan     In summary patient has had longstanding difficult to control hypertension but of late has actually been a bit hypotensive and is currently normotensive in spite of having stopped amlodipine  and spironolactone  and hydralazine  and even clonidine .  He remains on Entresto  and carvedilol  at this time along with loop diuretics.  If further he required dobutamine during the recent hospitalization for heart failure exacerbation and was quite hypotensive in the hospital with worsening creatinine    At this point my recommendations are as follows:  Do a secondary cause workup to rule out primary hyperaldosteronism (ordered)  Follow-up with primary cardiology team for optimization of heart  failure treatment and gradual initiation of amlodipine  and spironolactone  as tolerated by blood pressure  Follow-up with primary cardiology team to keep an eye on creatinine.  Creatinine of more than 2 generally or a GFR of less than 45 would be a contraindication for renal denervation    I will be happy to see the patient or consult again if he continues to struggle with hypertension and if creatinine improves    This note was dictated using the dragon speech recognition software.  Transcription errors may occur with the use of this software. Editing and proofreading were done by the author of this document.  In spite of the author's best effort to identify every error introduced by voice to text dictation, some errors that may represent misspelling or misstatements of what was dictated may persist.  If there are questions about content in this document please contact Dr. Lue Bring.           Current Medications (including today's revisions)   albuterol  0.083% (PROVENTIL ) 2.5 mg /3 mL (0.083 %) nebulizer solution Inhale 3 mL solution by nebulizer as directed every 4 hours.    albuterol  sulfate (PROAIR  HFA) 90 mcg/actuation HFA aerosol inhaler Inhale one puff to two puffs by mouth into the lungs every 4 hours as needed for Wheezing (or shortness of breath).    allopurinoL (ZYLOPRIM) 300 mg tablet Take one tablet by mouth daily.    ascorbic acid/multivit-min (EMERGEN-C PO) Take 1 tablet by mouth daily.    aspirin  EC 81 mg tablet Take 1 Tab by mouth daily.    azelastine  (ASTELIN ) 137 mcg (0.1 %) nasal spray INHALE TWO SPRAYS INTO EACH NOSTRIL AS DIRECTED TWICE DAILY.    betamethasone dipropionate (DIPROSONE) 0.05 % topical ointment USE TO WORST ITCHY AREAS ON BODY TWICE DAILY AS NEEDED    budesonide -glycopyr-formoterol  (BREZTRI  AEROSPHERE) 160-9-4.8 mcg/actuation inhaler Inhale two puffs by mouth into the lungs twice daily.    carvediloL  (COREG ) 25 mg tablet Take two tablets by mouth twice daily with meals. Take with food.  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 Take with food.  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    clopiDOGreL  (PLAVIX ) 75 mg tablet TAKE 1 TABLET BY MOUTH EVERY DAY    DEXCOM G7 SENSOR sensor device Use one each as directed daily.    dulaglutide (TRULICITY) 0.75 mg/0.5 mL injection pen Inject 0.5 mL under the skin every 7 days.    ELIQUIS 5 mg tablet TAKE 1 TABLET (5 MG) BY MOUTH TWICE DAILY    empagliflozin  (JARDIANCE ) 10 mg tablet Take one tablet by mouth daily. Indications: cardiovascular disease associated with type 2 diabetes mellitus, chronic heart failure, decreased kidney function, type 2 diabetes mellitus    ERGOcalciferoL  (vitamin D2) (DRISDOL ) 1,250 mcg (50,000 unit) capsule Take one capsule by mouth every 7 days.    ezetimibe  (ZETIA ) 10 mg tablet TAKE 1 TABLET BY MOUTH EVERY DAY    famotidine  (PEPCID ) 20 mg tablet TAKE 1 TABLET BY MOUTH TWICE A DAY    furosemide (LASIX) 20 mg tablet Take one tablet by mouth every morning. Take for lower extremity swelling    gabapentin  (NEURONTIN ) 100 mg PO capsule Take 1 Cap by mouth three times daily. (Patient taking differently: Take three capsules by mouth twice daily.)  hydrALAZINE  (APRESOLINE ) 100 mg tablet Take one tablet by mouth three times daily. (Patient not taking: Reported on 01/07/2024)    HYDROcodone/acetaminophen  (NORCO) 5/325 mg tablet Take one tablet by mouth twice daily as needed for Pain.    isosorbide  mononitrate ER (IMDUR ) 30 mg tablet TAKE 1 TABLET BY MOUTH EVERY MORNING (Patient taking differently: two tablets.)    methylPREDNISolone  (MEDROL ) 32 mg tablet Take one tablet by mouth as directed. Take 32mg  by mouth 12 hours before appointment, then take 32mg  by mouth 2 hours before appointment time    multivit-min/folic/vit K/lycop (MEN'S 50 PLUS DAILY FORMULA PO) Take 1 tablet by mouth daily.    mupirocin  (CENTANY ) 2 % topical ointment Apply  topically to affected area three times daily. Apply to the front of the nose as needed up to three times per day    Nebulizer Accessories kit Please dispense one nebulizer kit    nitroglycerin  (NITROSTAT ) 0.4 mg tablet Place one tab under tongue every 5 mins not to exceed 3 tabs;as needed for chest pain. If chest pain persists go to the ER or call 911.    predniSONE  (DELTASONE ) 20 mg tablet Take two tablets by mouth daily. Indications: COPD exacerbation, safety script    rosuvastatin  (CRESTOR ) 40 mg tablet TAKE 1 TABLET BY MOUTH EVERY DAY    sacubitriL -valsartan  (ENTRESTO ) 97-103 mg tablet Take one tablet by mouth twice daily.    tiZANidine (ZANAFLEX) 4 mg tablet Take one tablet by mouth three times daily as needed.    traZODone (DESYREL) 100 mg tablet Take two tablets by mouth at bedtime as needed.    triamcinolone acetonide (KENALOG) 0.1 % topical ointment

## 2024-01-21 ENCOUNTER — Encounter: Admit: 2024-01-21 | Discharge: 2024-01-21 | Payer: MEDICARE

## 2024-02-13 ENCOUNTER — Ambulatory Visit: Admit: 2024-02-13 | Discharge: 2024-02-14 | Payer: MEDICARE

## 2024-02-13 ENCOUNTER — Encounter: Admit: 2024-02-13 | Discharge: 2024-02-13 | Payer: MEDICARE

## 2024-02-13 ENCOUNTER — Ambulatory Visit: Admit: 2024-02-13 | Discharge: 2024-02-13 | Payer: MEDICARE

## 2024-02-15 ENCOUNTER — Encounter: Admit: 2024-02-15 | Discharge: 2024-02-15 | Payer: MEDICARE

## 2024-02-15 DIAGNOSIS — I5042 Chronic combined systolic (congestive) and diastolic (congestive) heart failure: Secondary | ICD-10-CM

## 2024-02-15 DIAGNOSIS — R931 Abnormal findings on diagnostic imaging of heart and coronary circulation: Principal | ICD-10-CM

## 2024-02-24 ENCOUNTER — Encounter: Admit: 2024-02-24 | Discharge: 2024-02-24 | Payer: MEDICARE

## 2024-03-11 ENCOUNTER — Ambulatory Visit: Admit: 2024-03-11 | Discharge: 2024-03-11 | Payer: MEDICARE

## 2024-03-11 ENCOUNTER — Encounter: Admit: 2024-03-11 | Discharge: 2024-03-11 | Payer: MEDICARE

## 2024-03-11 VITALS — BP 139/84 | HR 73 | Temp 97.70000°F | Resp 21 | Ht 71.0 in | Wt 245.2 lb

## 2024-03-11 DIAGNOSIS — J40 Bronchitis, not specified as acute or chronic: Principal | ICD-10-CM

## 2024-03-11 MED ORDER — AMOXICILLIN-POT CLAVULANATE 875-125 MG PO TAB
1 | ORAL_TABLET | Freq: Two times a day (BID) | ORAL | 0 refills | Status: CN
Start: 2024-03-11 — End: ?

## 2024-03-11 MED ORDER — BENZONATATE 200 MG PO CAP
200 mg | ORAL_CAPSULE | ORAL | 0 refills | 9.00000 days | Status: DC
Start: 2024-03-11 — End: 2024-03-12
  Filled 2024-03-12: qty 15, 5d supply, fill #0

## 2024-03-11 MED ORDER — AMOXICILLIN-POT CLAVULANATE 875-125 MG PO TAB
1 | ORAL_TABLET | Freq: Two times a day (BID) | ORAL | 0 refills | 7.00000 days | Status: DC
Start: 2024-03-11 — End: 2024-03-12

## 2024-03-11 MED ORDER — BENZONATATE 200 MG PO CAP
200 mg | ORAL_CAPSULE | ORAL | 0 refills | Status: CN
Start: 2024-03-11 — End: ?

## 2024-03-11 MED ORDER — BENZONATATE 200 MG PO CAP
200 mg | ORAL_CAPSULE | ORAL | 0 refills | 9.00000 days | Status: AC
Start: 2024-03-11 — End: ?

## 2024-03-11 MED ORDER — AMOXICILLIN-POT CLAVULANATE 875-125 MG PO TAB
1 | ORAL_TABLET | Freq: Two times a day (BID) | ORAL | 0 refills | 7.00000 days | Status: AC
Start: 2024-03-11 — End: ?
  Filled 2024-03-12: qty 20, 10d supply, fill #0

## 2024-03-11 NOTE — Progress Notes [1]
 HPI:  Cough (Dry cough, began a few 5-6 days ago, shortness of breath, back and shoulder pain)     History of Present Illness  Sean Henderson is a 68 year old male who presents with a persistent cough and sinus congestion.    He has been experiencing a persistent dry cough since Thursday, accompanied by the production of clear fluid. The cough is severe enough to prevent him from lying down comfortably for more than twenty minutes, and he can only sleep on his right side.    He denies headaches and ear pain but has sinus pain and pressure. He has been taking Claritin to manage his symptoms.    He takes water pills and blood thinners daily but has not used any specific medications for cold symptoms such as Tylenol  or Mucinex .    No headaches or ear pain. Sinus pain and pressure are present. He has a dry cough with clear fluid production.      Pertinent Medical/Surgical History reviewed    History obtained from patient    Review of Systems   Constitutional: Negative.    HENT:  Positive for congestion.    Eyes: Negative.    Respiratory:  Positive for cough.    Cardiovascular: Negative.    Gastrointestinal: Negative.    Musculoskeletal: Negative.    Skin: Negative.    Neurological: Negative.         Physical Exam  Vitals reviewed.   Constitutional:       General: He is not in acute distress.     Appearance: Normal appearance. He is not ill-appearing.   HENT:      Head: Normocephalic and atraumatic.      Right Ear: Tympanic membrane normal.      Left Ear: Tympanic membrane normal.      Nose: Nose normal.      Mouth/Throat:      Mouth: Mucous membranes are moist.      Pharynx: Oropharynx is clear. No oropharyngeal exudate or posterior oropharyngeal erythema.   Eyes:      Extraocular Movements: Extraocular movements intact.   Cardiovascular:      Rate and Rhythm: Normal rate and regular rhythm.      Pulses: Normal pulses.      Heart sounds: Normal heart sounds.   Pulmonary:      Effort: Pulmonary effort is normal. Breath sounds: Normal breath sounds.   Musculoskeletal:         General: Normal range of motion.      Cervical back: Normal range of motion.   Skin:     General: Skin is warm and dry.   Neurological:      General: No focal deficit present.      Mental Status: He is alert and oriented to person, place, and time.          Vitals:    03/11/24 1707   BP: 139/84   Pulse: 73   Temp: 97.7 ?F (36.5 ?C)   Resp: 21   SpO2: 95%          albuterol  0.083% (PROVENTIL ) 2.5 mg /3 mL (0.083 %) nebulizer solution Inhale 3 mL solution by nebulizer as directed every 4 hours.    albuterol  sulfate (PROAIR  HFA) 90 mcg/actuation HFA aerosol inhaler Inhale one puff to two puffs by mouth into the lungs every 4 hours as needed for Wheezing (or shortness of breath).    allopurinoL (ZYLOPRIM) 300 mg tablet Take one tablet by mouth daily. (  Patient not taking: Reported on 03/11/2024)    amoxicillin-potassium clavulanate (AUGMENTIN) 875/125 mg tablet Take one tablet by mouth twice daily with meals for 10 days.    ascorbic acid/multivit-min (EMERGEN-C PO) Take 1 tablet by mouth daily.    azelastine  (ASTELIN ) 137 mcg (0.1 %) nasal spray INHALE TWO SPRAYS INTO EACH NOSTRIL AS DIRECTED TWICE DAILY.    benzonatate  (TESSALON ) 200 mg capsule Take one capsule by mouth every 8 hours.    betamethasone dipropionate (DIPROSONE) 0.05 % topical ointment USE TO WORST ITCHY AREAS ON BODY TWICE DAILY AS NEEDED    budesonide -glycopyr-formoterol  (BREZTRI  AEROSPHERE) 160-9-4.8 mcg/actuation inhaler Inhale two puffs by mouth into the lungs twice daily.    carvediloL  (COREG ) 25 mg tablet Take two tablets by mouth twice daily with meals. Take with food.  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 Take with food.  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 (Patient not taking: Reported on 03/11/2024)    clopiDOGreL  (PLAVIX ) 75 mg tablet TAKE 1 TABLET BY MOUTH EVERY DAY    DEXCOM G7 SENSOR sensor device Use one each as directed daily.    dulaglutide (TRULICITY) 0.75 mg/0.5 mL injection pen Inject 0.5 mL under the skin every 7 days.    ELIQUIS 5 mg tablet TAKE 1 TABLET (5 MG) BY MOUTH TWICE DAILY    empagliflozin  (JARDIANCE ) 10 mg tablet Take one tablet by mouth daily. Indications: cardiovascular disease associated with type 2 diabetes mellitus, chronic heart failure, decreased kidney function, type 2 diabetes mellitus (Patient not taking: Reported on 02/13/2024)    ERGOcalciferoL  (vitamin D2) (DRISDOL ) 1,250 mcg (50,000 unit) capsule Take one capsule by mouth every 7 days. (Patient not taking: Reported on 02/13/2024)    ezetimibe  (ZETIA ) 10 mg tablet TAKE 1 TABLET BY MOUTH EVERY DAY (Patient not taking: Reported on 02/13/2024)    famotidine  (PEPCID ) 20 mg tablet TAKE 1 TABLET BY MOUTH TWICE A DAY    furosemide (LASIX) 20 mg tablet Take one tablet by mouth every morning. Take for lower extremity swelling    gabapentin  (NEURONTIN ) 100 mg PO capsule Take 1 Cap by mouth three times daily. (Patient taking differently: Take three capsules by mouth as Needed.)    hydrALAZINE  (APRESOLINE ) 100 mg tablet Take one tablet by mouth three times daily. (Patient taking differently: Take one tablet by mouth as Needed.)    HYDROcodone/acetaminophen  (NORCO) 5/325 mg tablet Take one tablet by mouth twice daily as needed for Pain.    isosorbide  mononitrate ER (IMDUR ) 30 mg tablet TAKE 1 TABLET BY MOUTH EVERY MORNING (Patient taking differently: two tablets.)    methylPREDNISolone  (MEDROL ) 32 mg tablet Take one tablet by mouth as directed. Take 32mg  by mouth 12 hours before appointment, then take 32mg  by mouth 2 hours before appointment time (Patient not taking: Reported on 02/13/2024)    multivit-min/folic/vit K/lycop (MEN'S 50 PLUS DAILY FORMULA PO) Take 1 tablet by mouth daily. (Patient not taking: Reported on 02/13/2024)    mupirocin  (CENTANY ) 2 % topical ointment Apply  topically to affected area three times daily. Apply to the front of the nose as needed up to three times per day    Nebulizer Accessories kit Please dispense one nebulizer kit    nitroglycerin  (NITROSTAT ) 0.4 mg tablet Place one tab under tongue every 5 mins  not to exceed 3 tabs;as needed for chest pain. If chest pain persists go to the ER or call 911.    predniSONE  (DELTASONE ) 20 mg tablet Take two tablets by mouth daily. Indications: COPD exacerbation, safety script (Patient not taking: Reported on 02/13/2024)    rosuvastatin  (CRESTOR ) 40 mg tablet TAKE 1 TABLET BY MOUTH EVERY DAY    sacubitriL -valsartan  (ENTRESTO ) 97-103 mg tablet Take one tablet by mouth twice daily.    tiZANidine (ZANAFLEX) 4 mg tablet Take one tablet by mouth three times daily as needed. (Patient not taking: Reported on 02/13/2024)    traZODone (DESYREL) 100 mg tablet Take two tablets by mouth at bedtime as needed.    triamcinolone acetonide (KENALOG) 0.1 % topical ointment  (Patient not taking: Reported on 02/13/2024)       Past Medical History:    Arthritis    CAD (coronary artery disease), native coronary artery    Cardiomyopathy    CKD (chronic kidney disease) stage 3, GFR 30-59 ml/min (CMS-HCC)    COPD (chronic obstructive pulmonary disease) (CMS-HCC)    Coronary artery disease    Dizziness    DM (diabetes mellitus) (CMS-HCC)    Embolism and thrombosis of unspecified artery (CMS-HCC)    Generalized headaches    HLD (hyperlipidemia)    Hypertension    Myocardial infarction (CMS-HCC)    OSA (obstructive sleep apnea)    PAD (peripheral artery disease)    Prostate cancer (CMS-HCC)    Seasonal allergic reaction    Stroke (CMS-HCC)    Tobacco abuse      CHEST 2 VIEWS  Narrative: CHEST 2 VIEWS    Clinical history: cough    Technique: PA and lateral views of the chest.    Comparison: Chest x-ray from 07/12/2022.    Findings:    Heart is again mildly enlarged with otherwise unremarkable mediastinal contours. No significant pulmonary venous congestion/pulmonary edema.    No pleural effusion, pneumothorax, or focal consolidation.    Multilevel thoracic spondylosis. No obvious destructive osseous lesion or acute bony abnormality appreciated. Postsurgical changes at the left shoulder.  Impression: 1. No focal lung consolidation.  2. Persistent mild cardiomegaly.     Finalized by Norleen Pane, M.D. on 03/11/2024 6:04 PM. Dictated by Norleen Pane, M.D. on 03/11/2024 6:03 PM.             Assessment/Plan:  Elspeth NOVAK. Crager was seen today for cough.    Diagnoses and all orders for this visit:    Bronchitis  -     CHEST 2 VIEWS; Future; Expected date: 03/11/2024    Other orders  -     amoxicillin -potassium clavulanate (AUGMENTIN ) 875/125 mg tablet; Take one tablet by mouth twice daily with meals for 10 days.  -     benzonatate  (TESSALON ) 200 mg capsule; Take one capsule by mouth every 8 hours.      Assessment & Plan      Patient Instructions   Take your antibiotics as prescribed until completed.  You can take Tylenol  for body aches and fever as needed as long as you are not allergic.  You can use a Vicks vaporizer with liquid Vicks at night for congestion as needed.  You can take Mucinex  as directed for congestion as long as you are not allergic to it.  Drink plenty of water for hydration.  About 2 days after you start feeling better or if you have been prescribed antibiotics and you have been on your antibiotics for 2 days I  need you to throw away your toothbrush and replace it     Do not take any medications if there is the possibility you have an allergy to the medication  Always discuss with your pharmacist any new medications that you take to make sure that they do not have any interactions with any current medications that you are taking.  If symptoms fail to improve, follow-up with your PCP  If symptoms worsen please get evaluated in the emergency room for further evaluation and treatment  If you have had any testing done you will only be notified of positive results.    All testing results will show in your My Chart portal if you have signed up for it.      Bronchitis, Antibiotics (Adult)    Bronchitis is inflammation and swelling of the air passages (bronchial tubes) in your lungs. This is often caused by an infection. Symptoms include a dry, hacking cough that is worse at night. The cough may bring up yellow-green mucus. You may also feel short of breath or wheeze. Other symptoms may include tiredness, chest discomfort and tightness, body aches, fever, and chills.   This illness can be spread to other people in the first few days. It is spread through the air by coughing and sneezing. It is also spread by direct contact. This means touching the sick person and then touching your own eyes, nose, or mouth.   Bronchitis is most often caused by a virus. It is not often treated with antibiotic medicine. But severe or long-term bronchitis may be treated with antibiotics. Other medicines may be given to help relieve symptoms. Symptoms can last up to 2 weeks. The cough may last much longer. It may signal another problem, such as asthma or pneumonia.   In most cases, antibiotics won't help you recover from bronchitis. Antibiotics work by killing the bacteria that cause illness. But bronchitis is caused by a virus most of the time. Antibiotics do not help cure a virus.   Home care  Follow these guidelines when caring for yourself at home:   If your symptoms are bad, rest at home for the first 2 to 3 days. When you go back to your daily tasks, don't let yourself get too tired.  Don't smoke. Stay away from secondhand smoke.  You may use over-the-counter medicines to control fever or pain. Or use another medicine as prescribed. Talk with your healthcare provider before using these medicines if you have chronic liver or kidney disease. Or if you have ever had a stomach ulcer or bleeding in your stomach or intestines. Also talk to your provider if you are taking medicine to prevent blood clots. Aspirin  should never be taken by anyone younger than age 62 who has a virus or fever. It may cause severe liver or brain damage, or even death.  Your body needs a lot of fluids now. Drink 6 to 8 glasses of fluids per day. This includes water, soft drinks, sports drinks, juices, tea, or soup. Extra fluids will help loosen mucus in your nose and lungs.  Your appetite may be low. A light diet is fine.  Over-the-counter cough, cold, and sore-throat medicines will not shorten the length of the illness, but they may help ease symptoms. Don't use decongestants if you have high blood pressure.  If an antibiotic is prescribed, take it exactly as directed. Do not stop taking it, even when you feel better. Finish all of the antibiotic medicine.   Follow-up care  Follow up with your healthcare provider, as advised. If you had an X-ray or ECG (electrocardiogram), you will be told of any new test results that may affect your care.   Ask your provider about the pneumococcal vaccines and a yearly flu shot. There are two kinds of pneumococcal vaccines. You may need both. You?re at higher risk of lung infection if any of these apply to you:   Older age  You have a chronic lung disease  You have a condition that affects your immune system  You smoke  When to get medical care  Call your healthcare provider right away if have any of these:   Fever of 100.4?F (38?C) or higher  Coughing up more mucus  Facial pain or ear pain  Mild weakness, drowsiness, headache, or a stiff neck  Call 911  Call 911 if any of these occur:   Coughing up blood  Weakness, drowsiness, headache, or stiff neck that get worse  Trouble breathing, wheezing, or pain with breathing  Lips or skin looks blue, purple, or gray in color  Feeling of doom  [employer] last reviewed this educational content on 08/23/2022  ? 2000-2025 [employer], [place]. All rights reserved. This information is not intended as a substitute for professional medical care. Always follow your healthcare professional's instructions.

## 2024-03-11 NOTE — Patient Instructions [37]
 Take your antibiotics as prescribed until completed.  You can take Tylenol  for body aches and fever as needed as long as you are not allergic.  You can use a Vicks vaporizer with liquid Vicks at night for congestion as needed.  You can take Mucinex  as directed for congestion as long as you are not allergic to it.  Drink plenty of water for hydration.  About 2 days after you start feeling better or if you have been prescribed antibiotics and you have been on your antibiotics for 2 days I need you to throw away your toothbrush and replace it     Do not take any medications if there is the possibility you have an allergy to the medication  Always discuss with your pharmacist any new medications that you take to make sure that they do not have any interactions with any current medications that you are taking.  If symptoms fail to improve, follow-up with your PCP  If symptoms worsen please get evaluated in the emergency room for further evaluation and treatment  If you have had any testing done you will only be notified of positive results.    All testing results will show in your My Chart portal if you have signed up for it.      Bronchitis, Antibiotics (Adult)    Bronchitis is inflammation and swelling of the air passages (bronchial tubes) in your lungs. This is often caused by an infection. Symptoms include a dry, hacking cough that is worse at night. The cough may bring up yellow-green mucus. You may also feel short of breath or wheeze. Other symptoms may include tiredness, chest discomfort and tightness, body aches, fever, and chills.   This illness can be spread to other people in the first few days. It is spread through the air by coughing and sneezing. It is also spread by direct contact. This means touching the sick person and then touching your own eyes, nose, or mouth.   Bronchitis is most often caused by a virus. It is not often treated with antibiotic medicine. But severe or long-term bronchitis may be treated with antibiotics. Other medicines may be given to help relieve symptoms. Symptoms can last up to 2 weeks. The cough may last much longer. It may signal another problem, such as asthma or pneumonia.   In most cases, antibiotics won't help you recover from bronchitis. Antibiotics work by killing the bacteria that cause illness. But bronchitis is caused by a virus most of the time. Antibiotics do not help cure a virus.   Home care  Follow these guidelines when caring for yourself at home:   If your symptoms are bad, rest at home for the first 2 to 3 days. When you go back to your daily tasks, don't let yourself get too tired.  Don't smoke. Stay away from secondhand smoke.  You may use over-the-counter medicines to control fever or pain. Or use another medicine as prescribed. Talk with your healthcare provider before using these medicines if you have chronic liver or kidney disease. Or if you have ever had a stomach ulcer or bleeding in your stomach or intestines. Also talk to your provider if you are taking medicine to prevent blood clots. Aspirin  should never be taken by anyone younger than age 37 who has a virus or fever. It may cause severe liver or brain damage, or even death.  Your body needs a lot of fluids now. Drink 6 to 8 glasses of fluids per day. This  includes water, soft drinks, sports drinks, juices, tea, or soup. Extra fluids will help loosen mucus in your nose and lungs.  Your appetite may be low. A light diet is fine.  Over-the-counter cough, cold, and sore-throat medicines will not shorten the length of the illness, but they may help ease symptoms. Don't use decongestants if you have high blood pressure.  If an antibiotic is prescribed, take it exactly as directed. Do not stop taking it, even when you feel better. Finish all of the antibiotic medicine.   Follow-up care  Follow up with your healthcare provider, as advised. If you had an X-ray or ECG (electrocardiogram), you will be told of any new test results that may affect your care.   Ask your provider about the pneumococcal vaccines and a yearly flu shot. There are two kinds of pneumococcal vaccines. You may need both. You?re at higher risk of lung infection if any of these apply to you:   Older age  You have a chronic lung disease  You have a condition that affects your immune system  You smoke  When to get medical care  Call your healthcare provider right away if have any of these:   Fever of 100.4?F (38?C) or higher  Coughing up more mucus  Facial pain or ear pain  Mild weakness, drowsiness, headache, or a stiff neck  Call 911  Call 911 if any of these occur:   Coughing up blood  Weakness, drowsiness, headache, or stiff neck that get worse  Trouble breathing, wheezing, or pain with breathing  Lips or skin looks blue, purple, or gray in color  Feeling of doom  [employer] last reviewed this educational content on 08/23/2022  ? 2000-2025 [employer], [place]. All rights reserved. This information is not intended as a substitute for professional medical care. Always follow your healthcare professional's instructions.

## 2024-03-13 ENCOUNTER — Encounter: Admit: 2024-03-13 | Discharge: 2024-03-13 | Payer: MEDICARE

## 2024-03-13 NOTE — Telephone Encounter [36]
 Attempted to contact pt x2 to discuss coming in today to [facility] for ABI and arterial US  per V Hogle. Unable to leave VM at this time.

## 2024-03-17 ENCOUNTER — Encounter: Admit: 2024-03-17 | Discharge: 2024-03-17 | Payer: MEDICARE

## 2024-03-23 ENCOUNTER — Encounter: Admit: 2024-03-23 | Discharge: 2024-03-23 | Payer: MEDICARE

## 2024-03-23 DIAGNOSIS — I509 Heart failure, unspecified: Secondary | ICD-10-CM

## 2024-03-23 DIAGNOSIS — I5023 Acute on chronic systolic (congestive) heart failure: Secondary | ICD-10-CM

## 2024-03-23 DIAGNOSIS — N189 Chronic kidney disease, unspecified: Secondary | ICD-10-CM

## 2024-03-23 DIAGNOSIS — J9601 Acute respiratory failure with hypoxia: Secondary | ICD-10-CM

## 2024-03-23 DIAGNOSIS — I5043 Acute on chronic combined systolic (congestive) and diastolic (congestive) heart failure: Secondary | ICD-10-CM

## 2024-03-23 MED ORDER — IPRATROPIUM-ALBUTEROL 0.5 MG-3 MG(2.5 MG BASE)/3 ML IN NEBU
3 mL | RESPIRATORY_TRACT | 0 refills | Status: DC | PRN
Start: 2024-03-23 — End: 2024-03-30

## 2024-03-23 NOTE — ED Notes [6]
 68 y/o male to the emergency department for shortness of breath. Patient said that it began yesterday but worsened today around 4pm. He says that he has pain in his abdomen, bilateral ribs and chest area. For comfort patient has to sit straight up on edge of cart. Placed on 4L of O2 by Nasal canula. Patient has extensive heart history and follows wit [facility].   Pt AxO x4, skin race appropriate, pt on cardiac monitors, bed in lowest locked position, call light within reach.

## 2024-03-24 ENCOUNTER — Inpatient Hospital Stay: Admit: 2024-03-24 | Discharge: 2024-03-24 | Payer: MEDICARE

## 2024-03-24 ENCOUNTER — Inpatient Hospital Stay
Admission: EM | Admit: 2024-03-24 | Discharge: 2024-03-29 | Disposition: A | Discharge status: Home / Self Care | Payer: MEDICARE

## 2024-03-24 ENCOUNTER — Emergency Department: Admit: 2024-03-24 | Discharge: 2024-03-24 | Payer: MEDICARE

## 2024-03-24 VITALS — BP 135/72 | HR 72 | Temp 98.20000°F | Ht 71.0 in | Wt 243.0 lb

## 2024-03-24 LAB — LIMITED ECHO
BSA: 2.3 m2
E WAVE DECELARTION TIME: 239 ms
E/A RATIO: 0.5
ECHO EF: 35 %
EJECTION FRACTION: 26 %
FRACTIONAL SHORTENING: 15 % (ref 28–44)
INTERVENTRICULAR SEPTUM: 1.3 cm (ref 0.6–1)
LATERAL E/E' RATIO: 14
LEFT ATRIUM INDEX: 25 mL/m2 (ref 16–34)
LEFT ATRIUM SIZE: 4.5 cm (ref 3–4)
LEFT ATRIUM VOLUME: 59 mL (ref 18–58)
LEFT INTERNAL DIMENSION IN SYSTOLE: 5.5 cm (ref 2.5–4)
LEFT VENTRICLE DIASTOLIC VOLUME INDEX: 91 mL/m2 (ref 34–74)
LEFT VENTRICLE DIASTOLIC VOLUME: 215 mL (ref 62–150)
LEFT VENTRICLE MASS INDEX: 179 g/m2 (ref 49–115)
LEFT VENTRICLE SYSTOLIC VOLUME INDEX: 59 mL/m2 (ref 11–31)
LEFT VENTRICLE SYSTOLIC VOLUME: 138 mL (ref 21–61)
LEFT VENTRICULAR INTERNAL DIMENSION IN DIASTOLE: 6.5 cm (ref 4.2–5.8)
LEFT VENTRICULAR MASS: 420 g (ref 88–224)
MEDIAL E/E' RATIO: 8.8
MV PEAK A VEL: 0.8 m/s
MV PEAK E VEL PW: 0.4 m/s
POSTERIOR WALL: 1.4 cm (ref 0.6–1)
RA PRESSURE: 3
RELATIVE WALL THICKNESS: 0.4 (ref <=0.42)
RIGHT ATRIAL AREA: 16 cm2 (ref <18)
RIGHT HEART SYSTOLIC MMODE TAPSE: 2.4 cm (ref >1.7)
RIGHT HEART SYSTOLIC TDI S': 0.1 m/s
RIGHT VENTRICULAR BASAL DIAMETER: 3.7 cm (ref 2.5–4.1)
RIGHT VENTRICULAR MID DIAMETER: 2.6 cm (ref 1.9–3.5)
TDI LATERAL E': 0 m/s
TDI MEDIAL E': 0 m/s

## 2024-03-24 LAB — COMPREHENSIVE METABOLIC PANEL
~~LOC~~ BKR ALBUMIN: 4.2 g/dL — ABNORMAL HIGH (ref 3.5–5.0)
~~LOC~~ BKR ALK PHOSPHATASE: 74 U/L — ABNORMAL LOW (ref 25–110)
~~LOC~~ BKR ALT: 18 U/L (ref 7–56)
~~LOC~~ BKR ANION GAP: 10 10*3/uL (ref 3–12)
~~LOC~~ BKR AST: 14 U/L (ref 7–40)
~~LOC~~ BKR BLD UREA NITROGEN: 22 mg/dL (ref 7–25)
~~LOC~~ BKR CALCIUM: 9.2 mg/dL — ABNORMAL HIGH (ref 8.5–10.6)
~~LOC~~ BKR CO2: 29 mmol/L (ref 21–30)
~~LOC~~ BKR CREATININE: 1.7 mg/dL — ABNORMAL HIGH (ref 0.40–1.24)
~~LOC~~ BKR GLOMERULAR FILTRATION RATE (GFR): 43 mL/min — ABNORMAL LOW (ref >60–4.80)
~~LOC~~ BKR TOTAL BILIRUBIN: 0.8 mg/dL (ref 0.2–1.3)
~~LOC~~ BKR TOTAL PROTEIN: 6.9 g/dL (ref 6.0–8.0)

## 2024-03-24 LAB — HIGH SENSITIVITY TROPONIN I 2 HOUR
~~LOC~~ BKR HIGH SENSITIVITY TROPONIN I 2 HOUR: 30 ng/L — ABNORMAL HIGH (ref <20.0)
~~LOC~~ BKR HIGH SENSITIVITY TROPONIN I DELTA VALUE: 2.7

## 2024-03-24 LAB — CBC AND DIFF
~~LOC~~ BKR ABSOLUTE BASO COUNT: 0.1 10*3/uL (ref 0.00–0.20)
~~LOC~~ BKR ABSOLUTE EOS COUNT: 0 10*3/uL (ref 0.00–0.45)
~~LOC~~ BKR ABSOLUTE MONO COUNT: 0.3 10*3/uL (ref 0.00–0.80)
~~LOC~~ BKR HEMOGLOBIN: 14 g/dL (ref 13.5–16.5)
~~LOC~~ BKR MDW (MONOCYTE DISTRIBUTION WIDTH): 18 (ref <=20.6)
~~LOC~~ BKR RBC COUNT: 5.2 10*6/uL (ref 4.40–5.50)
~~LOC~~ BKR WBC COUNT: 7 10*3/uL (ref 4.50–11.00)

## 2024-03-24 LAB — KC ED MAIN ECG TRIAGE ONLY
P AXIS: 66 degrees
P-R INTERVAL: 212 ms
Q-T INTERVAL: 378 ms
QRS DURATION: 92 ms
QTC CALCULATION (BAZETT): 444 ms
R AXIS: -18 degrees
T AXIS: 106 degrees
VENTRICULAR RATE: 83 {beats}/min

## 2024-03-24 LAB — COVID INFLUENZA A/B AND RSV PCR

## 2024-03-24 LAB — MAGNESIUM: ~~LOC~~ BKR MAGNESIUM: 1.9 mg/dL — ABNORMAL HIGH (ref 1.6–2.6)

## 2024-03-24 LAB — NT-PRO-BNP: ~~LOC~~ BKR NT-PRO-BNP: 115 pg/mL — ABNORMAL HIGH (ref 40.0–<125)

## 2024-03-24 LAB — BASIC METABOLIC PANEL
~~LOC~~ BKR ANION GAP: 10 (ref 3–12)
~~LOC~~ BKR BLD UREA NITROGEN: 28 mg/dL — ABNORMAL HIGH (ref 7–25)
~~LOC~~ BKR CALCIUM: 9 mg/dL (ref 8.5–10.6)
~~LOC~~ BKR CHLORIDE: 103 mmol/L (ref 98–110)
~~LOC~~ BKR CO2: 30 mmol/L (ref 21–30)
~~LOC~~ BKR CREATININE: 1.8 mg/dL — ABNORMAL HIGH (ref 0.40–1.24)
~~LOC~~ BKR GLOMERULAR FILTRATION RATE (GFR): 39 mL/min — ABNORMAL LOW (ref >60)
~~LOC~~ BKR GLUCOSE, RANDOM: 93 mg/dL (ref 70–100)
~~LOC~~ BKR POTASSIUM: 3.8 mmol/L (ref 3.5–5.1)
~~LOC~~ BKR SODIUM, SERUM: 143 mmol/L (ref 137–147)

## 2024-03-24 LAB — POC GLUCOSE
~~LOC~~ BKR POC GLUCOSE: 97 mg/dL (ref 70–100)
~~LOC~~ BKR POC GLUCOSE: 205 mg/dL — ABNORMAL HIGH (ref 70–100)
~~LOC~~ BKR POC GLUCOSE: 98 mg/dL (ref 70–100)

## 2024-03-24 LAB — HIGH SENSITIVITY TROPONIN I 4 HR
~~LOC~~ BKR HI SEN TNI DELTA 4-2: -4 g/dL — ABNORMAL LOW (ref 6.0–8.0)
~~LOC~~ BKR HIGH SENSITIVITY TROPONIN I 4 HOUR: 25 ng/L — ABNORMAL HIGH (ref 8.5–<20.0)

## 2024-03-24 MED ORDER — FLUTICASONE FUROATE 100 MCG/ACTUATION IN DSDV
1 | Freq: Every day | RESPIRATORY_TRACT | 0 refills | Status: DC
Start: 2024-03-24 — End: 2024-03-30
  Administered 2024-03-24: 15:00:00 1 via RESPIRATORY_TRACT

## 2024-03-24 MED ORDER — SODIUM CHLORIDE 0.9 % IJ SOLN
10 mL | Freq: Once | INTRAVENOUS | 0 refills | Status: CP
Start: 2024-03-24 — End: ?
  Administered 2024-03-24: 21:00:00 10 mL via INTRAVENOUS

## 2024-03-24 MED ORDER — HYDROCODONE-ACETAMINOPHEN 5-325 MG PO TAB
1 | Freq: Two times a day (BID) | ORAL | 0 refills | Status: DC | PRN
Start: 2024-03-24 — End: 2024-03-30
  Administered 2024-03-25 – 2024-03-29 (×5): 1 via ORAL

## 2024-03-24 MED ORDER — FUROSEMIDE 10 MG/ML IJ SOLN
40 mg | Freq: Two times a day (BID) | INTRAVENOUS | 0 refills | Status: DC
Start: 2024-03-24 — End: 2024-03-24

## 2024-03-24 MED ORDER — ONDANSETRON 4 MG PO TBDI
4 mg | ORAL | 0 refills | Status: DC | PRN
Start: 2024-03-24 — End: 2024-03-30

## 2024-03-24 MED ORDER — ALBUTEROL SULFATE 2.5 MG /3 ML (0.083 %) IN NEBU
2.5 mg | Freq: Four times a day (QID) | RESPIRATORY_TRACT | 0 refills | Status: DC | PRN
Start: 2024-03-24 — End: 2024-03-25
  Administered 2024-03-24 – 2024-03-25 (×3): 2.5 mg via RESPIRATORY_TRACT

## 2024-03-24 MED ORDER — ROSUVASTATIN 20 MG PO TAB
40 mg | Freq: Every evening | ORAL | 0 refills | Status: DC
Start: 2024-03-24 — End: 2024-03-30
  Administered 2024-03-25 – 2024-03-28 (×4): 40 mg via ORAL

## 2024-03-24 MED ORDER — NICOTINE 14 MG/24 HR TD PT24
1 | Freq: Every day | TRANSDERMAL | 0 refills | Status: DC
Start: 2024-03-24 — End: 2024-03-30
  Administered 2024-03-24 – 2024-03-29 (×6): 1 via TRANSDERMAL

## 2024-03-24 MED ORDER — PERFLUTREN LIPID MICROSPHERES 1.1 MG/ML IV SUSP
1-10 mL | Freq: Once | INTRAVENOUS | 0 refills | Status: CP | PRN
Start: 2024-03-24 — End: ?
  Administered 2024-03-24: 21:00:00 5 mL via INTRAVENOUS

## 2024-03-24 MED ORDER — ALBUTEROL SULFATE 2.5 MG /3 ML (0.083 %) IN NEBU
2.5 mg | RESPIRATORY_TRACT | 0 refills | Status: DC | PRN
Start: 2024-03-24 — End: 2024-03-24
  Administered 2024-03-24 (×2): 2.5 mg via RESPIRATORY_TRACT

## 2024-03-24 MED ORDER — CLOPIDOGREL 75 MG PO TAB
75 mg | Freq: Every day | ORAL | 0 refills | Status: DC
Start: 2024-03-24 — End: 2024-03-30
  Administered 2024-03-24 – 2024-03-29 (×6): 75 mg via ORAL

## 2024-03-24 MED ORDER — FUROSEMIDE 10 MG/ML IJ SOLN
40 mg | Freq: Every day | INTRAVENOUS | 0 refills | Status: DC
Start: 2024-03-24 — End: 2024-03-24
  Administered 2024-03-24: 15:00:00 40 mg via INTRAVENOUS

## 2024-03-24 MED ORDER — SACUBITRIL-VALSARTAN 97-103 MG PO TAB
1 | Freq: Two times a day (BID) | ORAL | 0 refills | Status: DC
Start: 2024-03-24 — End: 2024-03-30
  Administered 2024-03-24 – 2024-03-29 (×10): 1 via ORAL

## 2024-03-24 MED ORDER — ISOSORBIDE MONONITRATE 60 MG PO TB24
60 mg | Freq: Every day | ORAL | 0 refills | Status: DC
Start: 2024-03-24 — End: 2024-03-26
  Administered 2024-03-24 – 2024-03-26 (×3): 60 mg via ORAL

## 2024-03-24 MED ORDER — SENNOSIDES-DOCUSATE SODIUM 8.6-50 MG PO TAB
1 | Freq: Every day | ORAL | 0 refills | Status: DC | PRN
Start: 2024-03-24 — End: 2024-03-26

## 2024-03-24 MED ORDER — POLYETHYLENE GLYCOL 3350 17 GRAM PO PWPK
1 | Freq: Every day | ORAL | 0 refills | Status: DC | PRN
Start: 2024-03-24 — End: 2024-03-26

## 2024-03-24 MED ORDER — APIXABAN 5 MG PO TAB
5 mg | Freq: Two times a day (BID) | ORAL | 0 refills | Status: DC
Start: 2024-03-24 — End: 2024-03-30
  Administered 2024-03-24 – 2024-03-26 (×5): 5 mg via ORAL

## 2024-03-24 MED ORDER — INSULIN ASPART 100 UNIT/ML SC FLEXPEN
0-6 [IU] | Freq: Before meals | SUBCUTANEOUS | 0 refills | Status: DC
Start: 2024-03-24 — End: 2024-03-30
  Administered 2024-03-24: 17:00:00 1 [IU] via SUBCUTANEOUS

## 2024-03-24 MED ORDER — UMECLIDINIUM-VILANTEROL 62.5-25 MCG/ACTUATION IN DSDV
1 | Freq: Every day | RESPIRATORY_TRACT | 0 refills | Status: DC
Start: 2024-03-24 — End: 2024-03-30
  Administered 2024-03-24: 15:00:00 1 via RESPIRATORY_TRACT

## 2024-03-24 MED ORDER — MELATONIN 5 MG PO TAB
5 mg | Freq: Every evening | ORAL | 0 refills | Status: DC | PRN
Start: 2024-03-24 — End: 2024-03-30
  Administered 2024-03-27 – 2024-03-29 (×3): 5 mg via ORAL

## 2024-03-24 MED ORDER — FUROSEMIDE 10 MG/ML IJ SOLN
40 mg | Freq: Once | INTRAVENOUS | 0 refills | Status: CP
Start: 2024-03-24 — End: ?
  Administered 2024-03-24: 07:00:00 40 mg via INTRAVENOUS

## 2024-03-24 MED ORDER — DEXTROSE 50 % IN WATER (D50W) IV SYRG
12.5-25 g | INTRAVENOUS | 0 refills | Status: DC | PRN
Start: 2024-03-24 — End: 2024-03-30

## 2024-03-24 MED ORDER — FUROSEMIDE 10 MG/ML IJ SOLN
40 mg | Freq: Two times a day (BID) | INTRAVENOUS | 0 refills | Status: CP
Start: 2024-03-24 — End: ?
  Administered 2024-03-24: 40 mg via INTRAVENOUS

## 2024-03-24 MED ORDER — ONDANSETRON HCL (PF) 4 MG/2 ML IJ SOLN
4 mg | INTRAVENOUS | 0 refills | Status: DC | PRN
Start: 2024-03-24 — End: 2024-03-30

## 2024-03-24 MED ORDER — CARVEDILOL 25 MG PO TAB
25 mg | Freq: Two times a day (BID) | ORAL | 0 refills | Status: DC
Start: 2024-03-24 — End: 2024-03-25
  Administered 2024-03-24 – 2024-03-25 (×3): 25 mg via ORAL

## 2024-03-24 MED ADMIN — IPRATROPIUM-ALBUTEROL 0.5 MG-3 MG(2.5 MG BASE)/3 ML IN NEBU [77459]: 3 mL | RESPIRATORY_TRACT | @ 06:00:00 | Stop: 2024-03-24 | NDC 60687040579

## 2024-03-24 NOTE — Care Coordination-Inpatient [600030]
 Please voalte / page  Med Private Night 4 until 8 am     Please voalte / page Med -1  after 8 am     Thanks        Nehemiah Settle , MD  Hospitalist  Available on [facility]

## 2024-03-24 NOTE — Patient Refusal [[id number]]
 Patient/family declined:  Bed/chair alarm and W/in arms reach during toileting/showering.    Patient/family educated on importance of intervention to their safety/quality of care. Patient/family continues to decline care.    Reason Why Patient/Family declined:  walks fine on own.    Individualized safety and/or care plan implemented. If additional safety measures implemented, please list them.     Escalated to:  Unit coordinator/charge nurse.

## 2024-03-24 NOTE — Case Mgmt DC Plan [600024]
 Case Management Admission Assessment    NAME:Sean Henderson                          MRN: [id number]             DOB:[birth year]          AGE: 68 y.o.  ADMISSION DATE: 03/23/2024             DAYS ADMITTED: LOS: 0 days      Today?s Date: 03/24/2024    Source of Information: Patient and EMR       Plan  Plan: Case Management Assessment  This CM met with pt for assessment on this date.  Provided contact information and explanation of SW/NCM roles.  Reviewed [facility] and Preparing for Discharge hand-outs.  Provided opportunity for questions and discussion. Pt/family encouraged to contact Case Management team with questions and concerns during hospitalization and until patient is able to transition back to the patient's primary care physician.  Patient has daily HCBS hours through [employer]. She assists patient with cooking, cleaning, and occasional transportation.   PT/ OT consulted.   CHF exacerbation, plan to continue IV diuresis. Patient remains on RA.   CM will continue to follow and assist in discharge planning as needed.      Patient Address/Phone  [address]  [place]  [place] [address]  [phone] (home)     Emergency Contact  Extended Emergency Contact Information  Primary Emergency Contact: Gebhart,Angela   [place]   Home Phone: [phone]  Relation: Daughter    Healthcare Directive  Healthcare Directive: No, patient does not have a healthcare directive  Would patient like to fill out a (a new) Healthcare Directive?: No, patient declined      Transportation  Does the Patient Need Case Management to Arrange Discharge Transport? (ex: facility, ambulance, wheelchair/stretcher, Medicaid, cab, other): No  Will the Patient Use Family Transport?: Yes  Transportation Name, Phone and Availability #1Early, [place] (Daughter)  [phone]    Expected Discharge Date  03/25/2024     Living Situation Prior to Admission  Living Arrangements  Type of Residence: Home, with Home Health or other assistance  Living Arrangements: Alone  Financial Risk Analyst / Tub: Tub/Shower Unit  How many levels in the residence?: 1  Can patient live on one level if needed?: Yes  Does residence have entry and/or inside stairs?: Yes (11th floor apartment with elevator access)  Assistance needed prior to admit or anticipated on discharge: Yes  Who provides assistance or could if needed?: HCBS caregiver  Are they in good health?: Yes  Can support system provide 24/7 care if needed?: No  Level of Function   Prior level of function: Needs assist with ADLs  Which ADLs require assistance?: cooking,cleaning, transportation  Who assists with ADLs?: HCBS caregiver  Cognitive Abilities   Cognitive Abilities: Alert and Oriented, Participates in decision making    Financial Resources  Coverage  Primary Insurance: Medicare Replacement  Secondary Insurance: Medicaid  Medicaid State: [place]   Additional Coverage: None  Medication Coverage    Medication Coverage: Medicaid  Have you experienced a noticeable increase in your copay costs recently?: No  Are current medications affordable?: Yes  Do You Use a Co-Pay Card or a Medication Assistance Program to Help Manage Medication Costs?: No  Do You Manage Your Own Medications?: Yes  Source of Income   Source Of Income: SSI  Financial Assistance Needed?  Medications are affordable.  Psychosocial Needs  Mental Health  Mental Health History: No  Substance Use History  Substance Use History Screen: No  Other  N/a    Current/Previous Services  PCP  Bernardino Velma SAUNDERS, [phone], [phone]  Has been seen within one year.   Pharmacy    [facility] [phone] -   [place], [place] - [address]  [address]  [place] [address]  Phone: [phone] Fax: [phone]    Durable Medical Equipment   Durable Medical Equipment at home: CPAP/BiPAP, Roller Walker  Home Health  Receiving home health: No  Hemodialysis or Peritoneal Dialysis  Undergoing hemodialysis or peritoneal dialysis: No  Tube/Enteral Feeds  Receive tube/enteral feeds: No  Infusion  Receive infusions: No  Private Duty  Private duty help used: No  Home and Community Based Services  Home and community based services: Yes  Agency name: [employer]  Provider Schedule: daily for about 6 hours, morning-afternoon  Services provided: cooking, cleaning, laundry  [employer] Supply: N/A  Hospice  Hospice: No  Outpatient Therapy  PT: No  OT: No  SLP: No  Skilled Nursing Facility/Nursing Home  SNF: No  NH: No  Inpatient Rehab  IPR: No  Long-Term Acute Care Hospital  LTACH: No  Acute Hospital Stay  Acute Hospital Stay: In the past  Was patient's stay within the last 30 days?: No      Vernell Sharps, RN, BSN Nurse Case Manager  Available on Voalte

## 2024-03-24 NOTE — ED Provider Notes [19]
 Emergency Department Provider Note    Chief Complaint:  Chief Complaint   Patient presents with    Shortness of Breath     Short of breath since yesterday, but worsened today around 4pm. Patient prefers to sit on edge of bed for comfort.     History of Present Illness:  68 y/o with hx of HFrEF, DM, COPD, CKD, HTN presents to the ER with SOB. This has been going on for the past couple of days. It is associated with chest pain in bilateral chest. He has also been coughing and has difficulty with nasal congestion. He is not on oxygen at home. He reports he feels as if he is suffocating. He was seen at [facility] hospital last month and reports he was treated for CHF and felt similar to this. He also completed a course of antibiotics and steroids for bronchitis 2 days ago. He is taking all of his medications including eliquis  which is new since a blood clot was discovered he believes in his lung.     Pt. does smoke cigarettes.        Review of Systems:  Review of Systems  Pertinent ROS was obtained and non-contributory except as noted in HPI.     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)    Generalized headaches    HLD (hyperlipidemia)    Hypertension    Myocardial infarction (CMS-HCC)    OSA (obstructive sleep apnea)    PAD (peripheral artery disease)    Prostate cancer (CMS-HCC)    Seasonal allergic reaction    Stroke (CMS-HCC)    Tobacco abuse     Past Surgical History:  Surgical History:   Procedure Laterality Date    Lt Heart Cath With Ventriculogram Left 12/08/2014    Performed by Erling Locus, MD at [facility] CATH LAB    Coronary Angiography N/A 12/08/2014    Performed by Erling Locus, MD at [facility] CATH LAB    Percutaneous Coronary Intervention N/A 12/08/2014    Performed by Erling Locus, MD at [facility] CATH LAB    PROSTATE BIOPSY  2017    ANGIOGRAPHY CORONARY ARTERY WITH LEFT HEART CATHETERIZATION, REQUEST RADIAL APPROACH N/A 02/14/2017    Performed by Erling Locus, MD at [facility] CATH LAB    POSSIBLE PERCUTANEOUS CORONARY STENT PLACEMENT WITH ANGIOPLASTY N/A 02/14/2017    Performed by Erling Locus, MD at [facility] CATH LAB    ANGIOGRAPHY CORONARY ARTERY WITH LEFT HEART CATHETERIZATION N/A 01/10/2018    Performed by Freeman Donnice NOVAK, MD, FACC at [facility] CATH LAB    POSSIBLE PERCUTANEOUS CORONARY STENT PLACEMENT WITH ANGIOPLASTY N/A 01/10/2018    Performed by Freeman Donnice NOVAK, MD, FACC at [facility] CATH LAB    HX HEART CATHETERIZATION       Social History:  Social History     Tobacco Use    Smoking status: Some Days     Current packs/day: 5.00     Average packs/day: 5.0 packs/day for 51.9 years (259.6 ttl pk-yrs)  Types: Cigarettes     Start date: 04/24/1972    Smokeless tobacco: Never    Tobacco comments:     Patient states he had somked up to 5 ppd at his peak      *09/30/2021 5 cigarettes daily    Substance Use Topics    Alcohol use: Not Currently    Drug use: Not Currently     Frequency: 2.0 times per week     Types: Marijuana     Comment: Smokes marijuana 1-2x/week at bedtime.Quit cocaine 1991     Family History:  Family History   Problem Relation Name Age of Onset    Heart Attack Mother      Hypertension Mother      Heart Attack Father      Hypertension Father      Heart Attack Sister      Hypertension Sister      Heart Attack Brother      Hypertension Brother      Diabetes Mother       Pertinent medical/surgical/social/family/allergy history reviewed.     Vitals:  ED Vitals      Date and Time T BP P RR SPO2 L User   03/23/24 2342 36.4 ?C (97.5 ?F) 201/108 82 32 PER MINUTE 93 % 4 Lpm LS   03/23/24 2349 -- 176/99 87 -- -- -- LS   03/23/24 2350 -- -- -- -- 94 % -- LS   03/23/24 2353 -- -- 81 25 PER MINUTE 96 % 4 Lpm JO   03/24/24 0000 -- 167/94 82 27 PER MINUTE 99 % -- LS   03/24/24 0030 -- 160/86 79 22 PER MINUTE 96 % -- LS   03/24/24 0100 -- 160/87 74 21 PER MINUTE 98 % -- LS   03/24/24 0159 -- -- -- -- 99 % -- LS   03/24/24 0200 -- 196/95 100 29 PER MINUTE -- -- LS          Physical Exam:  Physical Exam  Vitals and nursing note reviewed.   Constitutional:       Appearance: Normal appearance.   HENT:      Head: Normocephalic.      Nose: Rhinorrhea present.      Comments: clear nasal drainage     Mouth/Throat:      Mouth: Mucous membranes are moist.   Cardiovascular:      Rate and Rhythm: Normal rate and regular rhythm.      Pulses: Normal pulses.   Pulmonary:      Effort: Respiratory distress present.      Breath sounds: Wheezing present.   Abdominal:      Palpations: Abdomen is soft.      Tenderness: There is no abdominal tenderness.   Musculoskeletal:      Right lower leg: Edema present.      Left lower leg: Edema present.   Neurological:      General: No focal deficit present.      Mental Status: He is alert and oriented to person, place, and time.         Laboratory Results:  Labs Reviewed   CBC AND DIFF - Abnormal       Result Value Ref Range Status    White Blood Cells 7.00  4.50 - 11.00 10*3/uL Final    Red Blood Cells 5.27  4.40 - 5.50 10*6/uL Final    Hemoglobin 14.0  13.5 - 16.5 g/dL Final    Hematocrit 56.3  40.0 -  50.0 % Final    MCV 82.7  80.0 - 100.0 fL Final    MCH 26.6  26.0 - 34.0 pg Final    MCHC 32.2  32.0 - 36.0 g/dL Final    RDW 83.3 (*) 88.9 - 15.0 % Final    Platelet Count 232  150 - 400 10*3/uL Final    MPV 8.1  7.0 - 11.0 fL Final    Neutrophils 87.2 (*) 41.0 - 77.0 % Final    Lymphocytes 6.8 (*) 24.0 - 44.0 % Final    Monocytes 4.7  4.0 - 12.0 % Final    Eosinophils 0.3  0.0 - 5.0 % Final    Basophils 1.0  0.0 - 2.0 % Final    Absolute Neutrophil Count 6.10  1.80 - 7.00 10*3/uL Final    Absolute Lymph Count 0.50 (*) 1.00 - 4.80 10*3/uL Final    Absolute Monocyte Count 0.30  0.00 - 0.80 10*3/uL Final    Absolute Eosinophil Count 0.00  0.00 - 0.45 10*3/uL Final    Absolute Basophil Count 0.10  0.00 - 0.20 10*3/uL Final    MDW (Monocyte Distribution Width) 18.2  <=20.6 Final   COMPREHENSIVE METABOLIC PANEL - Abnormal    Sodium 145  137 - 147 mmol/L Final    Potassium 4.2  3.5 - 5.1 mmol/L Final    Chloride 106  98 - 110 mmol/L Final    Glucose 124 (*) 70 - 100 mg/dL Final    Blood Urea Nitrogen 22  7 - 25 mg/dL Final    Creatinine 8.29 (*) 0.40 - 1.24 mg/dL Final    Calcium 9.2  8.5 - 10.6 mg/dL Final    Total Protein 6.9  6.0 - 8.0 g/dL Final    Total Bilirubin 0.8  0.2 - 1.3 mg/dL Final    Albumin 4.2  3.5 - 5.0 g/dL Final    Alk Phosphatase 74  25 - 110 U/L Final    AST 14  7 - 40 U/L Final    ALT 18  7 - 56 U/L Final    CO2 29  21 - 30 mmol/L Final    Anion Gap 10  3 - 12 Final    Glomerular Filtration Rate (GFR) 43 (*) >60 mL/min Final   NT-PRO-BNP - Abnormal    NT-Pro-BNP 11,558 (*) <125 pg/mL Final   HIGH SENSITIVITY TROPONIN I 0 HOUR - Abnormal    hs Troponin I 0 Hour 27.5 (*) <20.0 ng/L Final   COVID INFLUENZA A/B AND RSV PCR - Normal    Influenza A Virus Not Detected  Not Detected, Test Invalid Final    Influenza B Virus Not Detected  Not Detected, Test Invalid Final    RSV PCR Not Detected  Not Detected, Test Invalid Final    COVID-19 (SARS-CoV-2) PCR Not Detected  Not Detected Final   MAGNESIUM  - Normal    Magnesium  1.9  1.6 - 2.6 mg/dL Final   HIGH SENSITIVITY TROPONIN PANEL    Narrative:     The following orders were created for panel order HIGH SENSITIVITY TROPONIN PANEL.  Procedure                               Abnormality         Status                     ---------                               -----------         ------  HIGH SENSITIVITY TROPON.SABRASABRA[[id number]]  Abnormal            Final result               HIGH SENSITIVITY TROPON.SABRASABRA[[id number]] HIGH SENSITIVITY TROPON.SABRASABRA[[id number]]                                                   Please view results for these tests on the individual orders.   HIGH SENSITIVITY TROPONIN I 2 HOUR     Radiology Interpretation:  CHEST SINGLE VIEW   Final Result         Development of congestive heart failure. Recommend continued progress films.             Finalized by Cathaleen JONETTA Race, MD on 03/24/2024 1:10 AM. Dictated by Cathaleen JONETTA Race, MD on 03/24/2024 1:08 AM.        EKG:  ECG Results              [facility] MAIN ECG TRIAGE ONLY (Final result)        Collection Time Result Time VT RATE P-R Interval QRS DURATION Q-T Interval QTC Calc Bazett P Axis R Axis T Axis    03/23/24 23:42:46 03/24/24 00:18:04 83 212 92 378 444 66 -18 106                     Final result                   Impression:    Sinus rhythm with 1st degree AV block  Possible Left atrial enlargement  T wave abnormality, consider lateral ischemia  Abnormal ECG  When compared with ECG of 13-Feb-2024 14:56,  T wave inversion no longer evident in Anterior leads  Confirmed by Debby Setter [id number]) on 03/24/2024 12:18:02 AM                                    Medical Decision Making:  Sean Henderson is a 68 y.o. male who presents with chief complaint as listed above. Based on the history and presentation, the list of differential diagnoses considered included, but was not limited to, CHF, pneumonia, COPD exacerbation    Pt. with SOB and orthopnea suspicious for CHF. Will check labs and CXR. BP has improved without treatment but will continue to monior.          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 He reports he is still taking this medicine though it is not on his medication list here or with his PCP at [facility].     Pt. labs today with stable CKD, no elevated wbc, elevated trop and BNP consistent with CHF exacerbation. Given a dose of Lasix  40 mg. Potassium was wnl at this time. With respiratory failure, will plan admission to IM for CHF exacerbation. Pt. agrees with plan.     D/w IM who accepted. Nurse will obtain a standing weight. Pt. initial 02 sat was high 80s on RA.                    Facility Administered Meds:  Medications   albuterol -ipratropium (DUONEB) nebulizer solution 3 mL (has  no administration in time range)   IPRATROPIUM-ALBUTEROL  0.5 MG-3 MG(2.5 MG BASE)/3 ML IN NEBU (Cabinet Override) (3 mL Inhalation Given 03/23/24 2353)   furosemide  (LASIX ) injection 40 mg (40 mg Intravenous Given 03/24/24 0107)     Coding:  Additional data reviewed:  History was obtained from an independent historian: Not in addition to what is mentioned above  Prior non-ED notes reviewed: Clinic note and Outside ED record  Independent interpretation of diagnostic tests was performed by me: X-Ray: pulmonary vascula congestion  Patient presentation/management was discussed with the following qualified health care professionals and/or other relevant professionals: Admitting Physician    Risk evaluation:  Diagnosis or treatment of patient condition impacted by social determinant of health: None  Tests Considered but not performed due to clinical scoring (if not mentioned in ED course, aside from what is implied by clinical scores listed): N/A  Rationale regarding whether admission or escalation of care considered if not performed (if not mentioned in ED course, aside from what is implied by clinical scores listed): N/A    Clinical Impression and COPA:  Patient's active diagnoses as well as contributing pre-existing medical problems include:  Clinical Impression   Acute on chronic congestive heart failure, unspecified heart failure type (CMS-HCC)   Acute respiratory failure with hypoxia (CMS-HCC)   Chronic kidney disease, unspecified CKD stage     Disposition/Follow up  ED Disposition     ED Disposition   Admit         No follow-up provider specified.  New Prescriptions    No medications on file     Procedure Notes:  Procedures     Attestation:  I personally performed the E/M including history, physical exam, and MDM.    Eleanor DELENA Ned, MD

## 2024-03-24 NOTE — Progress Notes [1]
 RT Adult Assessment Note    NAME:Sean Henderson             MRN: [id number]             DOB:12/14/1955          AGE: 68 y.o.  ADMISSION DATE: 03/23/2024             DAYS ADMITTED: LOS: 0 days    Additional Comments:  Impressions of the patient: breathing has improved through the day; wearing hospital cpap  Intervention(s)/outcome(s): Cont current RT orders  Patient education that was completed: na  Recommendations to the care team: see above    Vital Signs:  Pulse: 73  RR: 16 PER MINUTE  SpO2: 95 %  O2 Device: None (Room air)  Liter Flow:    O2%:      Breath Sounds:   Breath Sounds WDL: Exceptions to WDL  All Breath Sounds: Clear (Implies normal);Expiratory wheezes  Respiratory Effort:   Respiratory WDL: Exceptions to WDL  Respiratory Effort/Pattern: SOA (Short of air) on exertion;Unlabored  Comments: .

## 2024-03-24 NOTE — Patient Refusal [[id number]]
 Patient/family declined:  Bed/chair alarm and W/in arms reach during toileting/showering.    Patient/family educated on importance of intervention to their safety/quality of care. Patient/family continues to decline care.    Reason Why Patient/Family declined:  declines bed alarm and states can walk to bathroom unassisted. Will call for help if needed.    Individualized safety and/or care plan implemented. If additional safety measures implemented, please list them.     Escalated to:  Unit coordinator/charge nurse.

## 2024-03-24 NOTE — Progress Notes [1]
 RT Adult Assessment Note    NAME:Sean Henderson             MRN: [id number]             DOB:[birth year]          AGE: 68 y.o.  ADMISSION DATE: 03/23/2024             DAYS ADMITTED: LOS: 0 days    Additional Comments:  Impressions of the patient: Pt resting in bed SOA and wheezing. Pt has COPD hx and says he takes Breztri , albuterol  inhaler and nebulizing treatment occasionally, requested for albuterol  q4 while on admit.   Intervention(s)/outcome(s): Q4 albuterol , anoro/arnuity QDAY  Patient education that was completed: N/A  Recommendations to the care team: N/A    Vital Signs:  Pulse: 75  RR: 22 PER MINUTE  SpO2: 93 %  O2 Device: None (Room air)  Liter Flow:    O2%:      Breath Sounds:   All Breath Sounds: Inspiratory wheezes;Expiratory wheezes  Respiratory Effort:   Respiratory Effort/Pattern: SOA (Short of air)  Comments: q4 treatments per pt request

## 2024-03-24 NOTE — Progress Notes [1]
 Internal Medicine Daily Progress Note     Patient's Name:  Sean Henderson    MRN: [id number]   Today's Date:  03/25/2024  Admission Date: 03/23/2024  LOS: 1 day      Assessment and Plan     Daaiel Starlin is a 68 y.o. 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 Symptoms likely exacerbated by recent URI and underlying COPD.    Acute on chronic HFrEF exacerbation vs flash pulmonary edema  HTN  Iron  deficiency   Acute hypoxemic resp failure (resolved)  CAD s/p PCI LAD (2007, 2009)  PAD s/p multiple left SFA stents (2014, 2016)  -Presented with 3-day history of worsening shortness of breath, dyspnea on exertion, and orthopnea  -Seen by urgent care on 11/18 due to increased SOB and productive cough, treated with Augmentin , although reports limited improvement in symptoms  -On arrival, initially required 4 L, quickly weaned to room air.   -BNP elevated at 11,000. Trops slightly elevated but non-dynamic. EKG unremarkable.  -CXR on admission notable for pulmonary edema  -Echo (12/1): EF 35%, no intracardiac thrombus.   -12/2: Clinically improving following IV Lasix . Net negative ~1L since admission. Weight stable at ~243 lbs. Reports dry weight of approximately 241 lbs  -Iron  panel: Iron  32, % sat 10, ferritin 87.  -PTA meds: Lasix  40 mg daily, carvedilol  25 mg BID, Entresto  97-103 mg BID, Imdur  60 mg, Eliquis  5 mg BID, Plavix  75 mg. Reportedly no longer taking Jardiance  or spironolactone .  Plan  > Discontinue IV Lasix , transition to PTA PO Lasix  40 mg daily  > Start spironolactone  25 mg daily  > Increase carvedilol  to 37.5 mg BID  > Continue PTA Entresto   > Continue PTA isosorbide  dinitrate 60 mg daily  > Continue PTA Eliquis  5 mg BID  > Continue PTA Plavix  75 mg daily  > Will initiate IV iron  sucrose 300 mg x3 doses given iron  deficiency in the setting of HF    CKD 3B  -Cr 1.70 on admit, which appears to be his baseline  Plan  > Continue monitoring I/Os and daily weights      COPD  Tobacco use  -Active smoker (1-1.5 packs per day)  -Minimal expiratory wheezing on exam. Suspect COPD and active smoking may be contributing to presenting symptoms.   Plan  > Continue PTA Breztri   > Albuterol  nebs PRN  > Nicotine  patch      T2DM  -A1c 6.8 (03/24/24)  -PTA: Trulicity 0.75 mg weekly  Plan  > Low-dose correction factor    Gout  -Recently presented to urgent care 11/24 acute right elbow pain, concerning for gout flare. Prescribed a short course of steroids. Symptoms now improved.  Plan  > Continue PTA allopurinol       FEN: Diet Cardiac (Low Fat/Low Sodium)  VTE ppx: PTA Eliquis   Last bowel movement:  (pta)    Code status: Full Code  Disposition: Continue inpatient admission    Seen and discussed with Dr. Salah A Daghlas, MD.    Albertha Flor, MS3    ATTESTATION    I personally performed or re-performed the history, physical exam and treatment plan for the E/M.  I discussed the case with the Medical Student, and concur with the Medical Student documentation of history, physical exam and treatment plan unless otherwise noted.    Resident name:  Sharyne Endo, MD Date:  03/25/2024     Subjective   Pt expanded on hx and stated that on Saturday night (11/29), during a BM, experienced sudden onset of dyspnea when he stood up and had to lean over the counter for an extended period of time. He described the feeling like a heaviness and pressure that built up around his chest which prevented him from breathing normally. Pt reports that he took 4 tabs of lasix  (20mg ) that night and sat back down, after a unspecified time pt symptoms began to slowly improve. Pt details that he slept through Sat night without any noticeable issues. Sunday night (11/30) at 1600 pt had a similar episode to the night before, symptoms were the same with rapid onset dyspnea, pt took another 4 tabs of lasix , however there was no improvement after 30 minutes. Pt called his daughter who then called 911, as pt was not able to ambulate or sit up. He was then taken to [facility] early AM (12/1).     Pt reports no overnight events. This morning on evaluation, pt is awake and reports feeling better from the previous night. SOB has improved however pt still feels orthopneic if he lays down flat.  Pt is making good urine, has not had a BM however he states he also has not eaten the past day, and will likely have one today.     Objective     Vitals:    03/25/24 0725 03/25/24 0934 03/25/24 0935 03/25/24 0952   BP: (!) 144/80  Comment: rn notified (!) 145/69 Comment: see last VS    BP Source: Arm, Left Upper Arm, Left Upper     Pulse: 66 67 Comment: see last VS 74   Temp: 36.7 ?C (98 ?F)      SpO2: 97%   97%   O2 Device: None (Room air)   None (Room air)   O2 Liter Flow:       Weight:       Height:          Vitals:    03/24/24 0357 03/24/24 1110 03/25/24 0417   Weight: 110.2 kg (243 lb) 110.2 kg (243 lb) 110 kg (242 lb 9.6 oz)   (-0.2kg loss)     Intake/Output Summary (Last 24 hours) at 03/25/2024 1001  Last data filed at 03/25/2024 0430  Gross per 24 hour   Intake 1377 ml   Output 2425 ml   Net -1048 ml        PHYSICAL EXAM:  General: NAD, well developed, alert  HEENT: Normocephalic, atraumatic.   Neck: Neck supple, non-tender without lymphadenopathy, masses, or thyromegaly.  Cardiac: Regular rate and rhythm. Normal S1 and S2. No murmurs.  Lungs: Decreased lung sounds b/l, no crackles or rales heard, scattered expiratory wheezes  Abdomen: Soft, mild distention  Extremities: No b/l lower extremity edema present. Warm and dry. No cyanosis/clubbing/pallor. Peripheral pulses intact.   Psychiatric: Normal mood and affect. CBC     Lab Results   Component Value Date/Time    WBC 5.70 03/25/2024 06:02 AM    RBC 5.12 03/25/2024 06:02 AM    HGB 13.8 03/25/2024 06:02 AM    HCT 42.4 03/25/2024 06:02 AM    MCV 82.7 03/25/2024 06:02 AM    MCH 27.0 03/25/2024 06:02 AM  MCHC 32.6 03/25/2024 06:02 AM    RDW 16.6 (H) 03/25/2024 06:02 AM    PLTCT 192 03/25/2024 06:02 AM    MPV 8.3 03/25/2024 06:02 AM    Lab Results   Component Value Date/Time    NEUT 87.2 (H) 03/24/2024 12:08 AM    ANC 6.10 03/24/2024 12:08 AM    LYMA 6.8 (L) 03/24/2024 12:08 AM    ALC 0.50 (L) 03/24/2024 12:08 AM    MONA 4.7 03/24/2024 12:08 AM    AMC 0.30 03/24/2024 12:08 AM    EOSA 0.3 03/24/2024 12:08 AM    AEC 0.00 03/24/2024 12:08 AM    BASA 1.0 03/24/2024 12:08 AM    ABC 0.10 03/24/2024 12:08 AM          BMP    Lab Results   Component Value Date/Time    NA 143 03/25/2024 06:02 AM    K 3.9 03/25/2024 06:02 AM    CA 8.7 03/25/2024 06:02 AM    CL 106 03/25/2024 06:02 AM    CO2 26 03/25/2024 06:02 AM    GAP 11 03/25/2024 06:02 AM    EGFR1 28 (L) 07/12/2022 10:15 AM    CR 1.85 (H) 03/25/2024 06:02 AM    BUN 35 (H) 03/25/2024 06:02 AM         Latest Reference Range & Units Most Recent   NT-Pro-BNP <125 pg/mL 11,558 (H)  03/24/24 00:08   hs Troponin I 0 Hour <20.0 ng/L 27.5 (H)  03/24/24 00:08   hs Troponin I 2 Hour <20.0 ng/L 30.2 (H)  03/24/24 02:30   (H): Data is abnormally high     LIMITED ECHO  Result Date: 03/24/2024    The left ventricular systolic function is moderately reduced. The visually estimated ejection fraction is 35%.   The LV is dilated, global hypokinesis.   No intracardiac thrombus.   Grade I (mild) left ventricular diastolic dysfunction. Cannot determine left atrial pressure.   The right ventricular size is normal. The right ventricular systolic function is normal.   This was a 2D echo study only, cardiac valve structures were not fully evaluated.  Compared to the previous study dated 10/04/2023: LVEF = 34%, global hypokinesis, no thrombus was present. CHEST SINGLE VIEW  Result Date: 03/24/2024  CHEST SINGLE VIEW Clinical history: SHORTNESS OF BREATH. Comparison: 03/11/2024 Findings: Cardiomegaly, with development of venous congestion. Development of bibasilar infiltrates/edema, without consolidation, or pneumothorax. Possible small bilateral pleural effusions. No pneumothorax.     Development of congestive heart failure. Recommend continued progress films.  Finalized by Cathaleen JONETTA Race, MD on 03/24/2024 1:10 AM. Dictated by Cathaleen JONETTA Race, MD on 03/24/2024 1:08 AM.    CHEST 2 VIEWS  Result Date: 03/11/2024  CHEST 2 VIEWS Clinical history: cough Technique: PA and lateral views of the chest. Comparison: Chest x-ray from 07/12/2022. Findings: Heart is again mildly enlarged with otherwise unremarkable mediastinal contours. No significant pulmonary venous congestion/pulmonary edema. No pleural effusion, pneumothorax, or focal consolidation. Multilevel thoracic spondylosis. No obvious destructive osseous lesion or acute bony abnormality appreciated. Postsurgical changes at the left shoulder.     1. No focal lung consolidation. 2. Persistent mild cardiomegaly.  Finalized by Norleen Pane, M.D. on 03/11/2024 6:04 PM. Dictated by Norleen Pane, M.D. on 03/11/2024 6:03 PM.       Meds:  Scheduled Meds:albuterol  0.083% (PROVENTIL ) nebulizer solution 2.5 mg, 2.5 mg, Inhalation, QID & PRN  apixaban  (ELIQUIS ) tablet 5 mg, 5 mg, Oral, BID  carvediloL  (COREG ) tablet 25 mg, 25 mg,  Oral, BID w/meals  clopiDOGreL  (PLAVIX ) tablet 75 mg, 75 mg, Oral, QDAY  fluticasone  furoate (ARNUITY ELLIPTA ) 100 mcg/actuation inhaler 1 puff, 1 puff, Inhalation, QDAY   And  umeclidinium-vilanteroL (ANORO ELLIPTA ) 62.5-25 mcg/actuation inhaler 1 puff, 1 puff, Inhalation, QDAY  insulin  aspart (U-100) (NOVOLOG  FLEXPEN U-100 INSULIN ) injection PEN 0-6 Units, 0-6 Units, Subcutaneous, ACHS (22)  isosorbide  mononitrate ER (IMDUR ) tablet 60 mg, 60 mg, Oral, QDAY  nicotine  (NICODERM CQ ) 14 mg/day patch 1 patch, 1 patch, Transdermal, QDAY  rosuvastatin  (CRESTOR ) tablet 40 mg, 40 mg, Oral, QHS  sacubitriL -valsartan  (ENTRESTO ) 97-103 mg tablet 1 tablet, 1 tablet, Oral, BID    Continuous Infusions:  PRN and Respiratory Meds:albuterol -ipratropium Q4H PRN, dextrose  50% PRN, HYDROcodone /acetaminophen  BID PRN, melatonin QHS PRN, ondansetron  Q6H PRN **OR** ondansetron  Q6H PRN, polyethylene glycol 3350  QDAY PRN, sennosides-docusate sodium  QDAY PRN

## 2024-03-24 NOTE — H&P [4]
 Admission History and Physical Examination    Name: Sean Henderson   MRN: [id number]     DOB: Feb 06, 1956      Age: 68 y.o.  Admission Date: 03/23/2024     LOS: 0 days     Date of Service: 03/24/2024                        Assessment & Plan  Acute on chronic clinical systolic heart failure (CMS-HCC)    CAD (coronary artery disease), native coronary artery    COPD (chronic obstructive pulmonary disease) (CMS-HCC)    SOB (shortness of breath)    Peripheral artery disease (HCC) with repeat stenting left leg    Stage 3b chronic kidney disease (CMS-HCC)    Prostate cancer (CMS-HCC)    Acute respiratory failure with hypoxia (CMS-HCC)    Acute on chronic systolic congestive heart failure (CMS-HCC)    Present on Admission:   CAD (coronary artery disease), native coronary artery   COPD (chronic obstructive pulmonary disease) (CMS-HCC)   Peripheral artery disease (HCC) with repeat stenting left leg   Prostate cancer (CMS-HCC)   SOB (shortness of breath)   Stage 3b chronic kidney disease (CMS-HCC)   Acute on chronic clinical systolic heart failure (CMS-HCC)      Sean Henderson is a 68 y.o. male patient history of nonischemic cardiomyopathy, chronic combined heart failure, CAD with prior LAD stent placement x 2 with bare metal stents in 2007 and 2009, PAD with prior multiple SFA stents (including viabahn stents), hypertension, dyslipidemia, obstructive sleep apnea on CPAP therapy, stage IIIB CKD, gout, renal cysts, vitamin D  deficiency, prostate cancer and COPD/tobacco dependence who presents to the emergency department with worsening SOB, DOE, and orthopnea.         #Acute on chronic HfrEF exacerbation  #Acute hypoxemic resp failure (resolved)  #Pulm edema  #CAD s/p PCI LAD 2007 and 2009  #PAD s/p Stenting 2014 Left and 2016  - Patient presents to the emergency department with at least 3-day history of worsening shortness of breath, dyspnea on exertion and orthopnea  - The patient notes approximately a 11/18 visiting an urgent care due to the symptoms, because he also had a productive cough he was given Augmentin    - But he notes that there was limited improvement in his symptoms and they got worse on 11/30 which prompted them  - Of note in August the patient was admitted to [facility], was started on dobutamine and transition to milrinone and Lasix  drip due to shortness of breath.  Echocardiogram at that time showed an EF of 20-25% with an LV thrombus  - In the emergency department the patient initially required 4 L, now satting on room air, BNP elevated at 11,000 which is above his baseline despite CKD EKG unremarkable, troponin slightly elevated likely type II  - CXR notable for pulmonary edema  - On exam there is mild Rales bilateral lower lung fields slight lower extremity edema  - Overall I think this patient suffering from heart failure exacerbation, likely due to not on optimized heart failure medications, the patient states he only takes 20 mg of Lasix  a day.  His symptoms were likely made worse by a recent URI  Plan  > Continue IV 40 Lasix  daily for now  > Continue carvedilol  25 mg twice daily  > Continue PTA Entresto   > Continue PTA isosorbide  dinitrate  > Patient no longer taking hydralazine   > Continue  PTA Eliquis   > Patient no longer taking Jardiance   > Will obtain echo  > Consider heart failure consultation to help optimize medications  > Continue PTA Plavix       #CKD 3B  - CR 1.70 in the emergency department, this appears to be around his baseline  - Continues to make good urine he states  Plan  > Monitor urine output        #COPD  # Tobacco use  - The patient continues to smoke  - He was originally treated for productive cough and possible URI/bronchitis on 11/18 he completed a course of Augmentin   - I do not think the patient is in a COPD exacerbation, he does have a mild productive cough but the phlegm is clear, and his lungs are surprisingly unremarkable despite very mild rales in the lower lung fields  Plan  > Continue PTA albuterol   > Continue PTA Breztri       #T2DM  - Low-dose correction factor  - Will check A1c        #Gout  - Patient recently presented to urgent care 11/24 acute right elbow pain concerning for gout flare  - He was prescribed a short course of steroids and notes his symptoms have improved today  Plan  > Continue PTA allopurinol       Diet: Cardiac diet  IVF: None  PPX: PTA Eliquis   Monitor and replace electrolytes as needed  PT/OT: Ordered  Full code      Omega FORBES Search, DO   Internal Medicine  Available on Voalte            _____________________________________________________________________________    Primary Care Physician: Bernardino Bring R      Chief Complaint: Shortness of breath  History of Present Illness: Sean Henderson is a 68 y.o. male patient history of nonischemic cardiomyopathy, chronic combined heart failure, CAD with prior LAD stent placement x 2 with bare metal stents in 2007 and 2009, PAD with prior multiple SFA stents (including viabahn stents), hypertension, dyslipidemia, obstructive sleep apnea on CPAP therapy, stage IIIB CKD, gout, renal cysts, vitamin D  deficiency, prostate cancer and COPD/tobacco dependence who presents to the emergency department with worsening SOB, DOE, and orthopnea.  The patient states that he originally started having symptoms of shortness of breath on 11/18.  He presented to an urgent care where he was diagnosed with acute bronchitis and given a course of Augmentin .  The patient notes that his symptoms did not necessarily improve and the even got worse around 11/30.  Where he notes that he became severely short of breath upon ambulating to the restroom.  The patient does note even before 11/18 he has been suffering from DOE.  The patient states he does take all of his medications, but he is unsure exactly what meds he is taking.  He does weigh himself daily he says he weighs around 240.  And he does take a daily water  pill but is unclear how much.    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 CATH LAB    Coronary Angiography N/A 12/08/2014    Performed by Erling Locus, MD at [facility] CATH LAB    Percutaneous Coronary Intervention N/A 12/08/2014    Performed by Erling Locus, MD at [facility] CATH LAB    PROSTATE BIOPSY  2017    ANGIOGRAPHY CORONARY ARTERY WITH LEFT HEART CATHETERIZATION, REQUEST RADIAL APPROACH N/A 02/14/2017    Performed by Erling Locus, MD at [facility] CATH LAB    POSSIBLE PERCUTANEOUS CORONARY STENT PLACEMENT WITH ANGIOPLASTY N/A 02/14/2017    Performed by Erling Locus, MD at [facility] CATH LAB    ANGIOGRAPHY CORONARY ARTERY WITH LEFT HEART CATHETERIZATION N/A 01/10/2018    Performed by Freeman Donnice NOVAK, MD, FACC at [facility] CATH LAB    POSSIBLE PERCUTANEOUS CORONARY STENT PLACEMENT WITH ANGIOPLASTY N/A 01/10/2018    Performed by Freeman Donnice NOVAK, MD, FACC at [facility] CATH LAB    HX HEART CATHETERIZATION       Family History   Problem Relation Name Age of Onset    Heart Attack Mother Hypertension Mother      Heart Attack Father      Hypertension Father      Heart Attack Sister      Hypertension Sister      Heart Attack Brother      Hypertension Brother      Diabetes Mother       Social History     Tobacco Use    Smoking status: Some Days     Current packs/day: 5.00     Average packs/day: 5.0 packs/day for 51.9 years (259.6 ttl pk-yrs)     Types: Cigarettes     Start date: 04/24/1972    Smokeless tobacco: Never    Tobacco comments:     Patient states he had somked up to 5 ppd at his peak      *09/30/2021 5 cigarettes daily    Substance and Sexual Activity    Alcohol use: Not Currently    Drug use: Not Currently     Frequency: 2.0 times per week     Types: Marijuana     Comment: Smokes marijuana 1-2x/week at bedtime.Quit cocaine 1991    Sexual activity: Not Currently     Partners: Female             Immunizations (includes history and patient reported):   Immunization History   Administered Date(s) Administered    COVID-19 (MODERNA), mRNA vacc, 100 mcg/0.5 mL (PF) 05/29/2019, 07/01/2019, 12/22/2019, 10/14/2020    COVID-19 Bivalent (71YR+)(PFIZER), mRNA vacc, 26mcg/0.3mL 04/06/2021    Covid-19 mRNA Vaccine >=12yo (Moderna)(Spikevax) 03/29/2022, 03/28/2023    Covid-19 mRNA Vaccine >=12yo (Pfizer)(Comirnaty) 01/28/2024    FLU VACCINE >3YO (Preservative Free) 04/26/2011    Flu Vaccine =>65 YO High-Dose Quadrivalent (PF) 03/12/2019, 03/24/2020, 04/06/2021, 03/29/2022    Flu Vaccine =>65 YO High-dose Trivalent PF  03/28/2023    Pneumococcal Vaccine (23-Val Adult) 04/26/2011, 03/24/2020    Pneumococcal Vaccine(13-Val Peds/immunocompromised adult) 03/12/2019    Tdap Vaccine 11/14/2013    Zoster Vaccine Recombinant, Adjuvanted (shingles) IM (vial 2 of 2)(SHINGRIX) 10/28/2019           Allergies:  Contrast dye iv, iodine containing [iodinated contrast media]; Isovue-128 [iopamidol]; and Nitroglycerin     Medications:  Current Medications   Medication Directions   albuterol  0.083% (PROVENTIL ) 2.5 mg /3 mL (0.083 %) nebulizer solution Inhale 3 mL solution by nebulizer as directed every 4 hours.   albuterol  sulfate (PROAIR  HFA) 90 mcg/actuation HFA aerosol inhaler Inhale one puff to two puffs by mouth into the lungs every 4 hours as needed for Wheezing (or shortness of breath).   allopurinoL  (ZYLOPRIM ) 300 mg tablet Take one tablet by mouth daily.  Patient not taking: Reported on 03/11/2024   ascorbic acid/multivit-min (EMERGEN-C PO) Take 1 tablet by mouth daily.   azelastine  (ASTELIN ) 137 mcg (0.1 %) nasal spray INHALE TWO SPRAYS INTO EACH NOSTRIL AS DIRECTED TWICE DAILY.   betamethasone dipropionate (DIPROSONE) 0.05 % topical ointment USE TO WORST ITCHY AREAS ON BODY TWICE DAILY AS NEEDED   budesonide -glycopyr-formoterol  (BREZTRI  AEROSPHERE) 160-9-4.8 mcg/actuation inhaler Inhale two puffs by mouth into the lungs twice daily.   carvediloL  (COREG ) 25 mg tablet Take two tablets by mouth twice daily with meals. Take with food.  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 Take with food.  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  Patient not taking: Reported on 03/11/2024   clopiDOGreL  (PLAVIX ) 75 mg tablet TAKE 1 TABLET BY MOUTH EVERY DAY   DEXCOM G7 SENSOR sensor device Use one each as directed daily.   dulaglutide (TRULICITY) 0.75 mg/0.5 mL injection pen Inject 0.5 mL under the skin every 7 days.   ELIQUIS  5 mg tablet TAKE 1 TABLET (5 MG) BY MOUTH TWICE DAILY   empagliflozin  (JARDIANCE ) 10 mg tablet Take one tablet by mouth daily. Indications: cardiovascular disease associated with type 2 diabetes mellitus, chronic heart failure, decreased kidney function, type 2 diabetes mellitus  Patient not taking: Reported on 02/13/2024   ERGOcalciferoL  (vitamin D2) (DRISDOL ) 1,250 mcg (50,000 unit) capsule Take one capsule by mouth every 7 days.  Patient not taking: Reported on 02/13/2024   ezetimibe  (ZETIA ) 10 mg tablet TAKE 1 TABLET BY MOUTH EVERY DAY  Patient not taking: Reported on 02/13/2024   famotidine  (PEPCID ) 20 mg tablet TAKE 1 TABLET BY MOUTH TWICE A DAY   furosemide  (LASIX ) 20 mg tablet Take one tablet by mouth every morning. Take for lower extremity swelling   gabapentin  (NEURONTIN ) 100 mg PO capsule Take 1 Cap by mouth three times daily.  Patient taking differently: Take three capsules by mouth as Needed.   hydrALAZINE  (APRESOLINE ) 100 mg tablet Take one tablet by mouth three times daily.  Patient taking differently: Take one tablet by mouth as Needed.   HYDROcodone /acetaminophen  (NORCO) 5/325 mg tablet Take one tablet by mouth twice daily as needed for Pain.   isosorbide  mononitrate ER (IMDUR ) 30 mg tablet TAKE 1 TABLET BY MOUTH EVERY MORNING  Patient taking differently: two tablets.   methylPREDNISolone  (MEDROL ) 32 mg tablet Take one tablet by mouth as directed. Take 32mg  by mouth 12 hours before appointment, then take 32mg  by  mouth 2 hours before appointment time  Patient not taking: Reported on 02/13/2024   multivit-min/folic/vit K/lycop (MEN'S 50 PLUS DAILY FORMULA PO) Take 1 tablet by mouth daily.  Patient not taking: Reported on 02/13/2024   mupirocin  (CENTANY ) 2 % topical ointment Apply  topically to affected area three times daily. Apply to the front of the nose as needed up to three times per day   Nebulizer Accessories kit Please dispense one nebulizer kit   nitroglycerin  (NITROSTAT ) 0.4 mg tablet Place one tab under tongue every 5 mins not to exceed 3 tabs;as needed for chest pain. If chest pain persists go to the ER or call 911.   predniSONE  (DELTASONE ) 20 mg tablet Take two tablets by mouth daily. Indications: COPD exacerbation, safety script  Patient not taking: Reported on 02/13/2024   rosuvastatin  (CRESTOR ) 40 mg tablet TAKE 1 TABLET BY MOUTH EVERY DAY sacubitriL -valsartan  (ENTRESTO ) 97-103 mg tablet Take one tablet by mouth twice daily.   tiZANidine (ZANAFLEX) 4 mg tablet Take one tablet by mouth three times daily as needed.  Patient not taking: Reported on 02/13/2024   traZODone (DESYREL) 100 mg tablet Take two tablets by mouth at bedtime as needed.   triamcinolone acetonide (KENALOG) 0.1 % topical ointment          Physical Exam:  Vital Signs: Last Filed In 24 Hours Vital Signs: 24 Hour Range   BP: 196/95 (12/01 0200)  Temp: 36.4 ?C (97.5 ?F) (11/30 2342)  Pulse: 100 (12/01 0200)  Respirations: 29 PER MINUTE (12/01 0200)  SpO2: 99 % (12/01 0159)  O2 Device: Nasal cannula (11/30 2353)  O2 Liter Flow: 4 Lpm (11/30 2353)  Height: 180.3 cm (5' 11) (11/30 2342) BP: (160-201)/(86-108)   Temp:  [36.4 ?C (97.5 ?F)]   Pulse:  [74-100]   Respirations:  [21 PER MINUTE-32 PER MINUTE]   SpO2:  [93 %-99 %]   O2 Device: Nasal cannula  O2 Liter Flow: 4 Lpm   Intensity Pain Scale (Self Report): 10 (03/23/24 2340)      Physical Exam  Constitutional:       General: He is not in acute distress.     Appearance: Normal appearance. He is not toxic-appearing.   Eyes:      Pupils: Pupils are equal, round, and reactive to light.   Cardiovascular:      Rate and Rhythm: Normal rate and regular rhythm.      Pulses: Normal pulses.      Heart sounds: No murmur heard.  Pulmonary:      Effort: Pulmonary effort is normal.      Breath sounds: Rales present.   Abdominal:      General: Abdomen is flat. There is no distension.      Palpations: Abdomen is soft.      Tenderness: There is no abdominal tenderness.   Musculoskeletal:      Right lower leg: Edema present.      Left lower leg: Edema present.   Skin:     General: Skin is warm.   Neurological:      General: No focal deficit present.      Mental Status: He is alert and oriented to person, place, and time.      Cranial Nerves: No cranial nerve deficit.      Motor: No weakness.              Lab/Radiology/Other Diagnostic Tests:  24-hour labs:    Results for orders placed or performed during the hospital  encounter of 03/23/24 (from the past 24 hours)   CBC AND DIFF    Collection Time: 03/24/24 12:08 AM   Result Value Ref Range    White Blood Cells 7.00 4.50 - 11.00 10*3/uL    Red Blood Cells 5.27 4.40 - 5.50 10*6/uL    Hemoglobin 14.0 13.5 - 16.5 g/dL    Hematocrit 56.3 59.9 - 50.0 %    MCV 82.7 80.0 - 100.0 fL    MCH 26.6 26.0 - 34.0 pg    MCHC 32.2 32.0 - 36.0 g/dL    RDW 83.3 (H) 88.9 - 15.0 %    Platelet Count 232 150 - 400 10*3/uL    MPV 8.1 7.0 - 11.0 fL    Neutrophils 87.2 (H) 41.0 - 77.0 %    Lymphocytes 6.8 (L) 24.0 - 44.0 %    Monocytes 4.7 4.0 - 12.0 %    Eosinophils 0.3 0.0 - 5.0 %    Basophils 1.0 0.0 - 2.0 %    Absolute Neutrophil Count 6.10 1.80 - 7.00 10*3/uL    Absolute Lymph Count 0.50 (L) 1.00 - 4.80 10*3/uL    Absolute Monocyte Count 0.30 0.00 - 0.80 10*3/uL    Absolute Eosinophil Count 0.00 0.00 - 0.45 10*3/uL    Absolute Basophil Count 0.10 0.00 - 0.20 10*3/uL    MDW (Monocyte Distribution Width) 18.2 <=20.6   COMPREHENSIVE METABOLIC PANEL    Collection Time: 03/24/24 12:08 AM   Result Value Ref Range    Sodium 145 137 - 147 mmol/L    Potassium 4.2 3.5 - 5.1 mmol/L    Chloride 106 98 - 110 mmol/L    Glucose 124 (H) 70 - 100 mg/dL    Blood Urea Nitrogen 22 7 - 25 mg/dL    Creatinine 8.29 (H) 0.40 - 1.24 mg/dL    Calcium 9.2 8.5 - 89.3 mg/dL    Total Protein 6.9 6.0 - 8.0 g/dL    Total Bilirubin 0.8 0.2 - 1.3 mg/dL    Albumin 4.2 3.5 - 5.0 g/dL    Alk Phosphatase 74 25 - 110 U/L    AST 14 7 - 40 U/L    ALT 18 7 - 56 U/L    CO2 29 21 - 30 mmol/L    Anion Gap 10 3 - 12    Glomerular Filtration Rate (GFR) 43 (L) >60 mL/min   NT-PRO-BNP    Collection Time: 03/24/24 12:08 AM   Result Value Ref Range    NT-Pro-BNP 11,558 (H) <125 pg/mL   MAGNESIUM     Collection Time: 03/24/24 12:08 AM   Result Value Ref Range    Magnesium  1.9 1.6 - 2.6 mg/dL   HIGH SENSITIVITY TROPONIN I 0 HOUR    Collection Time: 03/24/24 12:08 AM   Result Value Ref Range    hs Troponin I 0 Hour 27.5 (H) <20.0 ng/L   COVID INFLUENZA A/B AND RSV PCR    Collection Time: 03/24/24 12:38 AM    Specimen: Nasopharyngeal; Swab   Result Value Ref Range    Influenza A Virus Not Detected Not Detected, Test Invalid    Influenza B Virus Not Detected Not Detected, Test Invalid    RSV PCR Not Detected Not Detected, Test Invalid    COVID-19 (SARS-CoV-2) PCR Not Detected Not Detected     Glucose: (!) 124 (03/24/24 0008)  Pertinent radiology reviewed.    Omega FORBES Search, DO  Pager

## 2024-03-25 LAB — COMPREHENSIVE METABOLIC PANEL: ~~LOC~~ BKR TOTAL PROTEIN: 6.5 g/dL (ref 6.0–8.0)

## 2024-03-25 LAB — IRON + BINDING CAPACITY + %SAT+ FERRITIN
~~LOC~~ BKR % SATURATION: 10 % — ABNORMAL LOW (ref 28–42)
~~LOC~~ BKR FERRITIN: 87 ng/mL (ref 30–300)
~~LOC~~ BKR IRON BINDING: 323 ug/dL (ref 270–380)
~~LOC~~ BKR IRON: 32 g/dL — ABNORMAL LOW (ref 50–185)
~~LOC~~ BKR TRANSFERRIN: 217 mg/dL (ref 185–336)

## 2024-03-25 LAB — BASIC METABOLIC PANEL
~~LOC~~ BKR ANION GAP: 10 (ref 3–12)
~~LOC~~ BKR BLD UREA NITROGEN: 36 mg/dL — ABNORMAL HIGH (ref 7–25)
~~LOC~~ BKR CALCIUM: 9 mg/dL (ref 8.5–10.6)
~~LOC~~ BKR CHLORIDE: 106 mmol/L (ref 98–110)
~~LOC~~ BKR CO2: 27 mmol/L (ref 21–30)
~~LOC~~ BKR CREATININE: 1.9 mg/dL — ABNORMAL HIGH (ref 0.40–1.24)
~~LOC~~ BKR GLOMERULAR FILTRATION RATE (GFR): 38 mL/min — ABNORMAL LOW (ref >60)
~~LOC~~ BKR GLUCOSE, RANDOM: 134 mg/dL — ABNORMAL HIGH (ref 70–100)
~~LOC~~ BKR POTASSIUM: 3.6 mmol/L (ref 3.5–5.1)
~~LOC~~ BKR SODIUM, SERUM: 143 mmol/L (ref 137–147)

## 2024-03-25 LAB — CBC
~~LOC~~ BKR HEMATOCRIT: 42 % (ref 40.0–50.0)
~~LOC~~ BKR MCH: 27 pg — ABNORMAL HIGH (ref 26.0–34.0)
~~LOC~~ BKR MCHC: 32 g/dL — ABNORMAL HIGH (ref 32.0–36.0)
~~LOC~~ BKR RBC COUNT: 5.1 10*6/uL (ref 4.40–5.50)
~~LOC~~ BKR RDW: 16 % — ABNORMAL HIGH (ref 11.0–15.0)
~~LOC~~ BKR WBC COUNT: 5.7 10*3/uL (ref 4.50–11.00)

## 2024-03-25 LAB — MAGNESIUM: ~~LOC~~ BKR MAGNESIUM: 2.1 mg/dL (ref 1.6–2.6)

## 2024-03-25 LAB — POC GLUCOSE
~~LOC~~ BKR POC GLUCOSE: 70 mg/dL (ref 70–100)
~~LOC~~ BKR POC GLUCOSE: 114 mg/dL — ABNORMAL HIGH (ref 70–100)
~~LOC~~ BKR POC GLUCOSE: 87 mg/dL (ref 70–100)
~~LOC~~ BKR POC GLUCOSE: 91 mg/dL (ref 70–100)

## 2024-03-25 MED ORDER — IRON SUCROSE 300 MG IVPB
300 mg | Freq: Every day | INTRAVENOUS | 0 refills | Status: CP
Start: 2024-03-25 — End: ?
  Administered 2024-03-25 – 2024-03-27 (×6): 300 mg via INTRAVENOUS

## 2024-03-25 MED ORDER — EPINEPHRINE 1 MG/ML (1 ML) IJ SOLN
.3 mg | INTRAMUSCULAR | 0 refills | Status: AC | PRN
Start: 2024-03-25 — End: ?

## 2024-03-25 MED ORDER — ALBUTEROL SULFATE 2.5 MG /3 ML (0.083 %) IN NEBU
2.5 mg | Freq: Three times a day (TID) | RESPIRATORY_TRACT | 0 refills | Status: DC | PRN
Start: 2024-03-25 — End: 2024-03-30
  Administered 2024-03-25 – 2024-03-29 (×12): 2.5 mg via RESPIRATORY_TRACT

## 2024-03-25 MED ORDER — FUROSEMIDE 40 MG PO TAB
40 mg | Freq: Every morning | ORAL | 0 refills | Status: DC
Start: 2024-03-25 — End: 2024-03-26
  Administered 2024-03-25 – 2024-03-26 (×2): 40 mg via ORAL

## 2024-03-25 MED ORDER — SPIRONOLACTONE 25 MG PO TAB
25 mg | Freq: Every day | ORAL | 0 refills | Status: DC
Start: 2024-03-25 — End: 2024-03-30
  Administered 2024-03-25 – 2024-03-27 (×3): 25 mg via ORAL

## 2024-03-25 MED ORDER — ALLOPURINOL 300 MG PO TAB
300 mg | Freq: Every day | ORAL | 0 refills | Status: DC
Start: 2024-03-25 — End: 2024-03-26

## 2024-03-25 MED ORDER — SENNOSIDES-DOCUSATE SODIUM 8.6-50 MG PO TAB
1 | Freq: Two times a day (BID) | ORAL | 0 refills | Status: DC
Start: 2024-03-25 — End: 2024-03-30
  Administered 2024-03-26 – 2024-03-29 (×5): 1 via ORAL

## 2024-03-25 MED ORDER — POLYETHYLENE GLYCOL 3350 17 GRAM PO PWPK
1 | Freq: Every day | ORAL | 0 refills | Status: DC
Start: 2024-03-25 — End: 2024-03-30
  Administered 2024-03-26: 03:00:00 17 g via ORAL

## 2024-03-25 MED ORDER — IMS MIXTURE TEMPLATE
37.5 mg | Freq: Two times a day (BID) | ORAL | 0 refills | Status: DC
Start: 2024-03-25 — End: 2024-03-30
  Administered 2024-03-25 – 2024-03-29 (×18): 37.5 mg via ORAL

## 2024-03-25 MED ORDER — DIPHENHYDRAMINE HCL 50 MG/ML IJ SOLN
25 mg | INTRAVENOUS | 0 refills | Status: AC | PRN
Start: 2024-03-25 — End: ?

## 2024-03-25 NOTE — Progress Notes [1]
 Heart Failure Nursing Progress Note    Admission Date: 03/23/2024  LOS: 1 day    Admission Weight: 108.9 kg (240 lb)        Most recent weights (inpatient):   Vitals:    03/24/24 0357 03/24/24 1110 03/25/24 0417   Weight: 110.2 kg (243 lb) 110.2 kg (243 lb) 110 kg (242 lb 9.6 oz)     Weight change from previous day:-0.2kg    Fluid restriction ordered: no    Intake/Output Summary: (Last 24 hours)    Intake/Output Summary (Last 24 hours) at 03/25/2024 9386  Last data filed at 03/25/2024 0430  Gross per 24 hour   Intake 1727 ml   Output 2825 ml   Net -1098 ml       Is patient incontinent No    Heart Failure Education provided: Yes     - Medications (including diuretics) reviewed during administration  - Fluid restriction discussed  - Low sodium diet discussed    Anticipated discharge date: NA  Discharge goals: less SOA      Daily Assessment of Patient Stated Goals:    Short Term Goal Identified by patient (Short Term=during hospitalization):  Lose water  weight

## 2024-03-25 NOTE — Care Plan [600008]
 Problem: Discharge Planning  Goal: Participation in plan of care  Outcome: Goal Ongoing  Goal: Knowledge regarding plan of care  Outcome: Goal Ongoing  Goal: Prepared for discharge  Outcome: Goal Ongoing     Problem: Glucose Management  Goal: Absence of hyperglycemia  Outcome: Goal Ongoing  Goal: Absence of Hypoglycemia  Outcome: Goal Ongoing  Goal: Glucose level within specified parameters  Outcome: Goal Ongoing     Problem: Tobacco Use  Goal: Knowledge of tobacco-use cessation methods  Outcome: Goal Ongoing     Problem: Fluid Volume, Imbalanced  Goal: Absence of fluid overload  Outcome: Goal Ongoing

## 2024-03-25 NOTE — Patient Refusal [[id number]]
 Patient/family declined:  Bed/chair alarm and W/in arms reach during toileting/showering.    Patient/family educated on importance of intervention to their safety/quality of care. Patient/family continues to decline care.    Reason Why Patient/Family declined:  declines bed alarm and assistance and will call for help if needed..    Individualized safety and/or care plan implemented. If additional safety measures implemented, please list them.     Escalated to:  Unit coordinator/charge nurse.

## 2024-03-25 NOTE — Patient Refusal [[id number]]
 Patient/family declined:  Bed/chair alarm, W/in arms reach during toileting/showering, and Ambulation.    Patient/family educated on importance of intervention to their safety/quality of care. Patient/family continues to decline care.    Reason Why Patient/Family declined:  This RN re-educated pt on fall precautions as pt had previously been refusing fall precautions. Pt states  I don't need it, all I do is get up and urinate. Pt understood protocol but continued to refuse    Individualized safety and/or care plan implemented. If additional safety measures implemented, please list them.     Escalated to:  Unit coordinator/charge nurse.

## 2024-03-25 NOTE — Progress Notes [1]
 03/25/24 2300   Vitals   Temp 36.3 ?C (97.3 ?F)   Temperature Source Oral   Pulse 70   Respirations 18 PER MINUTE   SpO2 97 %   O2 Device None (Room air)   BP (!) 170/89   Mean NBP (Calculated) (!) 116 MM HG   BP Source Arm, Left Upper   BP Patient Position Head of bed (Comment degree)     Pt has some SOA which is not new, no CP or HA. Coreg  increased from 25 mg BID to 37.5 mg BID today afternoon. Notified Dr Duwaine Musca

## 2024-03-26 ENCOUNTER — Inpatient Hospital Stay: Admit: 2024-03-26 | Discharge: 2024-03-26 | Payer: MEDICARE

## 2024-03-26 ENCOUNTER — Encounter: Admit: 2024-03-26 | Discharge: 2024-03-26 | Payer: MEDICARE

## 2024-03-26 LAB — PV RENAL ARTERY DUPLEX SCAN
AORTIC MIDDLE VELOCITY: 1.6 m/s
AORTIC PROXIMAL VELOCITY: 1.6 m/s
MID AORTIC AP: 2.3 cm
PROX AORTIC AP: 2 cm
PROX AORTIC TRANS: 1.7 cm
RIGHT RENAL ORIGIN DIAS: 0.1 m/s
RIGHT RENAL ORIGIN SYS: 0.7 m/s
RIGHT RENAL UPPER PARENCHYMA MAX: 0.1 m/s — ABNORMAL LOW (ref 137–147)

## 2024-03-26 LAB — CBC
~~LOC~~ BKR HEMATOCRIT: 44 % (ref 40.0–50.0)
~~LOC~~ BKR HEMOGLOBIN: 14 g/dL — ABNORMAL LOW (ref 13.5–16.5)
~~LOC~~ BKR MCH: 27 pg (ref 26.0–34.0)
~~LOC~~ BKR MPV: 7.9 fL (ref 7.0–11.0)
~~LOC~~ BKR PLATELET COUNT: 220 10*3/uL — ABNORMAL HIGH (ref 150–400)
~~LOC~~ BKR RBC COUNT: 5.3 10*6/uL — ABNORMAL LOW (ref 4.40–5.50)
~~LOC~~ BKR RDW: 17 % — ABNORMAL HIGH (ref 11.0–15.0)

## 2024-03-26 LAB — POC GLUCOSE
~~LOC~~ BKR POC GLUCOSE: 123 mg/dL — ABNORMAL HIGH (ref 70–100)
~~LOC~~ BKR POC GLUCOSE: 87 mg/dL (ref 70–100)
~~LOC~~ BKR POC GLUCOSE: 94 mg/dL (ref 70–100)

## 2024-03-26 LAB — MAGNESIUM: ~~LOC~~ BKR MAGNESIUM: 2.2 mg/dL (ref 1.6–2.6)

## 2024-03-26 LAB — NT-PRO-BNP: ~~LOC~~ BKR NT-PRO-BNP: 569 pg/mL — ABNORMAL HIGH (ref 32.0–<125)

## 2024-03-26 MED ORDER — CARVEDILOL 12.5 MG PO TAB
37.5 mg | ORAL_TABLET | Freq: Two times a day (BID) | ORAL | 1 refills | Status: CN
Start: 2024-03-26 — End: ?

## 2024-03-26 MED ORDER — DEXAMETHASONE 0.5 MG PO TAB
1 mg | Freq: Once | ORAL | 0 refills | Status: CP
Start: 2024-03-26 — End: ?
  Administered 2024-03-27: 05:00:00 1 mg via ORAL

## 2024-03-26 MED ORDER — SPIRONOLACTONE 25 MG PO TAB
25 mg | ORAL_TABLET | Freq: Every day | ORAL | 1 refills | Status: CN
Start: 2024-03-26 — End: ?

## 2024-03-26 MED ORDER — FUROSEMIDE 10 MG/ML IJ SOLN
40 mg | Freq: Once | INTRAVENOUS | 0 refills | Status: DC
Start: 2024-03-26 — End: 2024-03-26

## 2024-03-26 MED ORDER — GUAIFENESIN 600 MG PO TA12
600 mg | Freq: Two times a day (BID) | ORAL | 0 refills | Status: DC
Start: 2024-03-26 — End: 2024-03-30
  Administered 2024-03-27 – 2024-03-29 (×5): 600 mg via ORAL

## 2024-03-26 MED ORDER — ISOSORBIDE DINITRATE 10 MG PO TAB
10 mg | Freq: Three times a day (TID) | ORAL | 0 refills | Status: DC
Start: 2024-03-26 — End: 2024-03-28
  Administered 2024-03-26 – 2024-03-28 (×6): 10 mg via ORAL

## 2024-03-26 MED ORDER — DIPHENHYDRAMINE HCL 50 MG PO CAP
50 mg | Freq: Once | ORAL | 0 refills | Status: CP
Start: 2024-03-26 — End: ?
  Administered 2024-03-28: 14:00:00 50 mg via ORAL

## 2024-03-26 MED ORDER — FAMOTIDINE 20 MG PO TAB
20 mg | Freq: Once | ORAL | 0 refills | Status: CP
Start: 2024-03-26 — End: ?
  Administered 2024-03-28: 14:00:00 20 mg via ORAL

## 2024-03-26 MED ORDER — METHYLPREDNISOLONE SOD SUC(PF) 125 MG/2 ML IJ SOLR
125 mg | Freq: Once | INTRAVENOUS | 0 refills | Status: DC
Start: 2024-03-26 — End: 2024-03-26

## 2024-03-26 MED ORDER — PREDNISONE 20 MG PO TAB
60 mg | Freq: Two times a day (BID) | ORAL | 0 refills | Status: CP
Start: 2024-03-26 — End: ?
  Administered 2024-03-28 (×4): 60 mg via ORAL

## 2024-03-26 MED ORDER — HYDRALAZINE 25 MG PO TAB
25 mg | Freq: Three times a day (TID) | ORAL | 0 refills | Status: DC
Start: 2024-03-26 — End: 2024-03-28
  Administered 2024-03-26 – 2024-03-28 (×6): 25 mg via ORAL

## 2024-03-26 MED ORDER — FUROSEMIDE 10 MG/ML IJ SOLN
40 mg | Freq: Every day | INTRAVENOUS | 0 refills | Status: DC
Start: 2024-03-26 — End: 2024-03-28
  Administered 2024-03-26 – 2024-03-27 (×2): 40 mg via INTRAVENOUS

## 2024-03-26 MED ORDER — ASPIRIN 325 MG PO TAB
325 mg | Freq: Once | ORAL | 0 refills | Status: CP
Start: 2024-03-26 — End: ?
  Administered 2024-03-28: 14:00:00 325 mg via ORAL

## 2024-03-26 MED ORDER — SODIUM CHLORIDE 0.9% IV SOLP
INTRAVENOUS | 0 refills | Status: AC
Start: 2024-03-26 — End: ?
  Administered 2024-03-28: 14:00:00 1000.0000 mL via INTRAVENOUS

## 2024-03-26 NOTE — Consults [2]
 Heart Failure Consult    NAME:Sean Henderson             MRN: [id number]                 DOB:1955/07/22          AGE: 68 y.o.  ADMISSION DATE: 03/23/2024             DAYS ADMITTED: LOS: 2 days      Principal Problem:    Acute on chronic clinical systolic heart failure (CMS-HCC)  Active Problems:    CAD (coronary artery disease), native coronary artery    COPD (chronic obstructive pulmonary disease) (CMS-HCC)    SOB (shortness of breath)    Peripheral artery disease (HCC) with repeat stenting left leg    Stage 3b chronic kidney disease (CMS-HCC)    Prostate cancer (CMS-HCC)    Acute respiratory failure with hypoxia (CMS-HCC)    Acute on chronic systolic congestive heart failure (CMS-HCC)      HPI:  Sean Henderson is a 68 y.o. male with a history of combined ICM and NICM, HFrEF (LVEF 35%), CAD with prior LAD stent placement x 2 with bare metal stents in 2007 and 2009, PAD with prior multiple SFA stents (including viabahn stents), hypertension, dyslipidemia, obstructive sleep apnea on CPAP therapy, stage IIIB CKD, gout, renal cysts, vitamin D  deficiency, prostate cancer and COPD/tobacco dependence.     He presented to the ED with worsening shortness of breath on 12/1. He initially began experiencing shortness of breath on 11/18. At that time, he was diagnosed with acute bronchitis and treated with Augmentin . NTproBNP on admission: 11,558. Hs-troponin: 27.5>30.2>25.7. CXR consistent with volume overload. He was hypertensive on arrival, BP 176/99.     PTA cardiac medications: carvedilol  25 mg twice daily, Plavix  75 mg daily, Eliquis  5 mg twice daily, lasix  40 mg daily, Imdur  30 mg daily, rosuvastatin  40 mg daily, Entresto  97-103 mg twice daily and spironolactone  25 mg daily (patient not taking PTA)     Recommendations 03/26/24:  Hold Eliquis  in preparation for RHC and LHC on Friday, 12/5.  Diuresis: lasix  40 mg IV daily.   GDMT for HFrEF: continue carvedilol  37.5 mg twice daily, Entresto  97-103 mg twice daily and spironolactone  25 mg daily. Discontinue Imdur  in favor or Isordil  10 mg TID and hydralazine  25 mg TID for additional afterload reduction.   Will need to consider LifeVest prior to discharge.   Cardiac rehab ordered 12/3.   CAD/PAD: continue Plavix  75 mg daily and rosuvastatin  40 mg daily.   Iron  repletion ordered per primary team.   Thank you for the consult, will continue to follow along.     Ongoing:  BMP once a day. Magnesium  level daily.  Keep Potassium greater than 4.0 and Magnesium  greater than 2.0.  2000mg  sodium dietary restriction.  Fluid Restriction: 2 L  Strict I/O. Goal output: net neg 2 and 3L/24 hour  Daily standing scale weight. Goal Dry Weight: to be determined, previously thought to be 240 lbs     Please place a specimen in lab NT-proBNP order on day of discharge in effort to establish baseline value correlating with suspected euvolemia.      Primary team responsible for placing Post-Discharge [facility] Appointment Request order set for Cardiology: Heart Failure appointment request within 24-48 hours of discharge. (Please do not place order any earlier in effort to reduce possible need for cancellation and rescheduling of visit).      Pt examined and discussed  with Dr. Jamas Fairly.      Stefano JINNY Hane, APRN-NP  Available on Voalte  HF Consult Team      Assessment:   Acute on chronic systolic and diastolic HFrEF,  EF: 35%.  Combined ischemic and non-ischemic cardiomyopathy   Major Complications or Comorbidities [facility]): acute/ acute on chronic systolic and/or diastolic heart failure  NYHA functional class III (marked limitation of physical activity - comfortable at rest, but less than ordinary activity causes symptoms of HF e.g., getting dressed or standing from a sitting position),   ACC Stage C (structural heart disease with prior or current symptoms of HF).   He presents with signs of hypervolemia with left  ventricular failure without signs of low flow state.      BNP Labs:   Lab Results Component Value Date    NTPROBNP 5,694 (H) 03/26/2024    NTPROBNP 11,558 (H) 03/24/2024    NTPROBNP 916 (H) 06/26/2023        Lab Results   Component Value Date    HSTROP0HR 27.5 (H) 03/24/2024    HSTROP2HR 30.2 (H) 03/24/2024    HSTROP4HR 25.7 (H) 03/24/2024    HSTROPDELT4 -4.5 03/24/2024    HSTROPDELTA 2.7 03/24/2024     Admission Weight: 108.9 kg (240 lb)        Most recent weights (inpatient):   Vitals:    03/25/24 0417 03/26/24 0412 03/26/24 0731   Weight: 110 kg (242 lb 9.6 oz) 108.7 kg (239 lb 9.6 oz) 109.8 kg (242 lb)        Strict I/O:   net for last 24 hrs: -1631 mL  UOP last 24 hrs: 3200 mL  net I/O for entire hospital stay: -3029 mL    Diuretic Therapy    Prior to admission dose    Lasix  40 mg PO daily    Given on admission    Lasix  40 mg IV x 1    Daily Dosing    12/1 Lasix  40 mg IV BID       12/2-12/3 Lasix  40 mg PO daily      12/4 Lasix  40 mg PO x 1 in AM, Lasix  40 mg IV x 1 in PM        Guideline Directed Medical Therapy PTA Inpatient Changes   ACEI/ARB No, on ARNI    ARNI Entresto  97-103 mg twice daily Continued    BB Carvedilol  25 mg twice daily 12/2 increased to 37.5 mg twice daily   SGLT-2 Inhibitor Previously on Jardiance , unclear why medication was stopped     Mineralocorticoid Receptor Antagonist Previously on spironolactone , unclear why medication was stopped  12/2 spironolactone  25 mg daily    Hydralazine /Nitrate Imdur  30 mg daily 12/2 Imdur  60 mg daily  12/3 Imdur  discontinued in favor of Isordil  10 mg TID, hydralazine  25 mg TID   Ivabradine No    Heart Rhythm Management Therapy No Consider LifeVest this admission   Anticoagulation for LV thrombus Eliquis  5 mg twice daily Continued on admission  12/3 held in preparation for RHC    Cardiac Rehab Evaluation for LVEF < or equal to 40% Yes; Date ordered: 03/26/24    7-Day post hospital follow up scheduled within 24-48 hours of discharge       Testing/Procedures   03/24/24 LIMITED TTE    The left ventricular systolic function is moderately reduced. The visually estimated ejection fraction is 35%.    The LV is dilated, global hypokinesis.    No intracardiac thrombus.  Grade I (mild) left ventricular diastolic dysfunction. Cannot determine left atrial pressure.    The right ventricular size is normal. The right ventricular systolic function is normal.    This was a 2D echo study only, cardiac valve structures were not fully evaluated.      Compared to the previous study dated 10/04/2023: LVEF = 34%, global hypokinesis, no thrombus was present.    08/30/23 MPI Stress Test   SUMMARY/OPINION:  This study is abnormal.  There is a moderate size, mild intensity, predominantly fixed of decreased radiotracer uptake in the basal to apical inferior wall segments consistent with an area of myocardial injury.  Global left ventricular systolic function is moderately reduced.  Calculated ejection fraction 29%.  Hypokinesis is slightly more pronounced in the inferior wall segments.  No evidence of transient ischemic dilatation.  The pharmacologic ECG portion of the study is nondiagnostic for ischemia.     Comparison is made with a prior D SPECT study completed 11/15/2021.  Ejection fraction was 35%.  Calculated ejection fraction is less on current study at 29%.  The perfusion pattern is quite similar.     CAD  - s/p PCI LAD (2007, 2009)  - Plavix  75 mg daily and rosuvastatin  20 mg daily     Hx LV thrombus  - PTA Eliquis  5 mg twice as above  - 12/3 limited TTE noted resolution of thrombus. Given low EF, would continue AC indefinitely     Hypertension  - medications as above in GDMT table     PAD   - s/p multiple left SFA stents (2014, 2016)  - Plavix  75 mg daily and rosuvastatin  20 mg daily     CKD - Stage 3b  - baseline Cr: 1.6-2.0  - Cr on admission: 1.70  Recent Labs     03/24/24  0008 03/24/24  1621 03/25/24  0602 03/25/24  1443 03/26/24  0544   CR 1.70* 1.84* 1.85* 1.90* 1.84*   BUN 22 28* 35* 36* 32*   - monitor closely with aggressive diuresis     Iron  deficiency  Lab Results   Component Value Date    IRON  32 (L) 03/25/2024    PSAT 10 (L) 03/25/2024    TIBC 323 03/25/2024    FERRITIN 87 03/25/2024   > Venofer  300 mg x 3 doses ordered per primary team (12/2-12/4)    COPD  Tobacco use   - 1-1.5 ppd   > management per primary team     Type 2 diabetes mellitus  Lab Results   Component Value Date/Time    HGBA1C 6.8 (H) 03/24/2024 05:07 AM    HGBA1C 7.4 (H) 06/26/2023 03:32 PM    HGBA1C 7.3 (H) 05/11/2017 02:55 AM    A1C 7.8 (A) 05/04/2022 12:00 AM   > management per primary team          _____________________________________________________________________________    Reason for Consultation:  Evaluation and recommendations re: heart failure     Subjective:  Sean Henderson reports he has had worsening shortness of breath on exertion for the last several months with associated chest discomfort and pounding sensation in his chest. He currently complains of dyspnea on exertion, chest pain, abdominal fullness, and weight change. He currently denies orthopnea, lightheadedness, positional lightheadedness, near syncope, and peripheral edema.    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)    Generalized headaches    HLD (hyperlipidemia)    Hypertension    Myocardial infarction (CMS-HCC)    OSA (obstructive sleep apnea)    PAD (peripheral artery disease)    Prostate cancer (CMS-HCC)    Seasonal allergic reaction    Stroke (CMS-HCC)    Tobacco abuse     Surgical History:   Procedure Laterality Date    Lt Heart Cath With Ventriculogram Left 12/08/2014    Performed by Erling Locus, MD at [facility] CATH LAB    Coronary Angiography N/A 12/08/2014    Performed by Erling Locus, MD at [facility] CATH LAB    Percutaneous Coronary Intervention N/A 12/08/2014    Performed by Erling Locus, MD at [facility] CATH LAB    PROSTATE BIOPSY  2017    ANGIOGRAPHY CORONARY ARTERY WITH LEFT HEART CATHETERIZATION, REQUEST RADIAL APPROACH N/A 02/14/2017    Performed by Erling Locus, MD at [facility] CATH LAB    POSSIBLE PERCUTANEOUS CORONARY STENT PLACEMENT WITH ANGIOPLASTY N/A 02/14/2017    Performed by Erling Locus, MD at [facility] CATH LAB    ANGIOGRAPHY CORONARY ARTERY WITH LEFT HEART CATHETERIZATION N/A 01/10/2018    Performed by Freeman Donnice NOVAK, MD, FACC at [facility] CATH LAB    POSSIBLE PERCUTANEOUS CORONARY STENT PLACEMENT WITH ANGIOPLASTY N/A 01/10/2018    Performed by Freeman Donnice NOVAK, MD, FACC at [facility] CATH LAB    HX HEART CATHETERIZATION       Family History   Problem Relation Name Age of Onset    Heart Attack Mother      Hypertension Mother      Heart Attack Father      Hypertension Father      Heart Attack Sister      Hypertension Sister      Heart Attack Brother      Hypertension Brother      Diabetes Mother       Social History[1]     Objective:    Allergies: Allergies[2]     Medications:  Scheduled Meds:albuterol  0.083% (PROVENTIL ) nebulizer solution 2.5 mg, 2.5 mg, Inhalation, TID & PRN  [Held by Provider] apixaban  (ELIQUIS ) tablet 5 mg, 5 mg, Oral, BID  carvediloL  (COREG ) tablet 37.5 mg, 37.5 mg, Oral, BID w/meals  clopiDOGreL  (PLAVIX ) tablet 75 mg, 75 mg, Oral, QDAY  fluticasone  furoate (ARNUITY ELLIPTA ) 100 mcg/actuation inhaler 1 puff, 1 puff, Inhalation, QDAY   And  umeclidinium-vilanteroL (ANORO ELLIPTA ) 62.5-25 mcg/actuation inhaler 1 puff, 1 puff, Inhalation, QDAY  furosemide  (LASIX ) injection 40 mg, 40 mg, Intravenous, QDAY  hydrALAZINE  (APRESOLINE ) tablet 25 mg, 25 mg, Oral, TID  insulin  aspart (U-100) (NOVOLOG  FLEXPEN U-100 INSULIN ) injection PEN 0-6 Units, 0-6 Units, Subcutaneous, ACHS (22)  iron  sucrose (VENOFER ) 300 mg in sodium chloride  0.9% 265 mL IVPB, 300 mg, Intravenous, QDAY  isosorbide  dinitrate (ISORDIL ) tablet 10 mg, 10 mg, Oral, TID  nicotine  (NICODERM CQ ) 14 mg/day patch 1 patch, 1 patch, Transdermal, QDAY  polyethylene glycol 3350  (MIRALAX ) packet 17 g, 1 packet, Oral, QDAY  rosuvastatin  (CRESTOR ) tablet 40 mg, 40 mg, Oral, QHS  sacubitriL -valsartan  (ENTRESTO ) 97-103 mg tablet 1 tablet, 1 tablet, Oral, BID  sennosides-docusate sodium  (SENOKOT-S)  tablet 1 tablet, 1 tablet, Oral, BID  spironolactone  (ALDACTONE ) tablet 25 mg, 25 mg, Oral, QDAY    Continuous Infusions:  PRN and Respiratory Meds:albuterol -ipratropium Q4H PRN, dextrose  50% PRN, diphenhydrAMINE  HCL Q4H PRN, EPINEPHrine  Q5 MIN PRN, HYDROcodone /acetaminophen  BID PRN, melatonin QHS PRN, ondansetron  Q6H PRN **OR** ondansetron  Q6H PRN    Prescriptions Prior to Admission[3]                          Vital Signs:  Last Filed                Vital Signs: 24 Hour Range   BP: 161/91 (12/03 1113)  Temp: 36.3 ?C (97.4 ?F) (12/03 1113)  Pulse: 66 (12/03 1113)  Respirations: 16 PER MINUTE (12/03 1113)  SpO2: 98 % (12/03 1113)  O2 Device: None (Room air) (12/03 1113)  O2 Liter Flow: 4 Lpm (12/03 0731)  Height: 180.3 cm (5' 11) (12/03 0731)  BP: (125-170)/(75-103)   Temp:  [36.2 ?C (97.2 ?F)-36.9 ?C (98.4 ?F)]   Pulse:  [64-70]   Respirations:  [16 PER MINUTE-18 PER MINUTE]   SpO2:  [95 %-98 %]   O2 Device: None (Room air)  O2 Liter Flow: 4 Lpm           Wt Readings from Last 10 Encounters:   03/26/24 109.8 kg (242 lb)   03/11/24 111.2 kg (245 lb 2.4 oz)   03/11/24 111.2 kg (245 lb 3.2 oz)   02/13/24 111.9 kg (246 lb 11.2 oz)   01/07/24 110.2 kg (243 lb)   12/12/23 115.6 kg (254 lb 12.8 oz)   10/04/23 111.1 kg (245 lb)   09/27/23 111.1 kg (245 lb)   09/07/23 112.9 kg (249 lb)   08/29/23 112.9 kg (249 lb)       Physical Exam:    General Appearance: no distress  Skin: warm and dry  Neck Veins: JVP 10-12 cm, HJR positive  Auscultation: breathing comfortably, lungs clear to auscultation in BUL, decreased BLL, no rales or rhonchi, no wheezing  Cardiac Auscultation: Regular rhythm, S1, S2, no S3 or S4, murmur  Pedal Pulses: pulses 2+, symmetric  Lower Extremity Edema: no   Abdominal Exam: soft, non-tender, bowel sounds normal  Orientation: clear historian, good insight    Laboratory Review:   CBC w/Diff    Lab Results   Component Value Date/Time    WBC 6.00 03/26/2024 05:44 AM    RBC 5.33 03/26/2024 05:44 AM    HGB 14.5 03/26/2024 05:44 AM    HCT 44.8 03/26/2024 05:44 AM    MCV 84.1 03/26/2024 05:44 AM    MCH 27.3 03/26/2024 05:44 AM    MCHC 32.4 03/26/2024 05:44 AM    RDW 17.1 (H) 03/26/2024 05:44 AM    PLTCT 220 03/26/2024 05:44 AM    MPV 7.9 03/26/2024 05:44 AM    Lab Results   Component Value Date/Time    NEUT 87.2 (H) 03/24/2024 12:08 AM    ANC 6.10 03/24/2024 12:08 AM    LYMA 6.8 (L) 03/24/2024 12:08 AM    ALC 0.50 (L) 03/24/2024 12:08 AM    MONA 4.7 03/24/2024 12:08 AM    AMC 0.30 03/24/2024 12:08 AM    EOSA 0.3 03/24/2024 12:08 AM    AEC 0.00 03/24/2024 12:08 AM    BASA 1.0 03/24/2024 12:08 AM    ABC 0.10 03/24/2024 12:08 AM         Chemistry    Lab Results  Component Value Date/Time    NA 141 03/26/2024 05:44 AM    K 4.0 03/26/2024 05:44 AM    CL 105 03/26/2024 05:44 AM    CO2 25 03/26/2024 05:44 AM    GAP 11 03/26/2024 05:44 AM    BUN 32 (H) 03/26/2024 05:44 AM    CR 1.84 (H) 03/26/2024 05:44 AM    GLU 93 03/26/2024 05:44 AM    MG 2.2 03/26/2024 05:44 AM    BNP 154.0 (H) 08/22/2021 03:30 PM    Lab Results   Component Value Date/Time    CA 9.6 03/26/2024 05:44 AM    PO4 2.7 03/24/2024 05:07 AM    ALBUMIN 4.0 03/26/2024 05:44 AM    TOTPROT 6.7 03/26/2024 05:44 AM    ALKPHOS 70 03/26/2024 05:44 AM    AST 13 03/26/2024 05:44 AM    ALT 12 03/26/2024 05:44 AM    TOTBILI 0.7 03/26/2024 05:44 AM    GFR 39 (L) 03/26/2024 05:44 AM    GFRAA >60 08/18/2019 02:00 PM            Renal Function    Lab Results   Component Value Date/Time    NA 141 03/26/2024 05:44 AM    K 4.0 03/26/2024 05:44 AM    CL 105 03/26/2024 05:44 AM    CO2 25 03/26/2024 05:44 AM    GAP 11 03/26/2024 05:44 AM    BUN 32 (H) 03/26/2024 05:44 AM    BUN 36 (H) 03/25/2024 02:43 PM    BUN 35 (H) 03/25/2024 06:02 AM    Lab Results   Component Value Date/Time    CR 1.84 (H) 03/26/2024 05:44 AM    CR 1.90 (H) 03/25/2024 02:43 PM    CR 1.85 (H) 03/25/2024 06:02 AM    GLU 93 03/26/2024 05:44 AM    CA 9.6 03/26/2024 05:44 AM    PO4 2.7 03/24/2024 05:07 AM    ALBUMIN 4.0 03/26/2024 05:44 AM        Lipid Profile INR   Lab Results   Component Value Date    CHOL 82 06/26/2023    TRIG 74 06/26/2023    HDL 30 (L) 06/26/2023    LDL 42 06/26/2023    VLDL 85.1 06/26/2023    NONHDLCHOL 52 06/26/2023         Lab Results   Component Value Date    INR 1.1 05/11/2017          12/1 Chest X-Ray:   Development of congestive heart failure. Recommend continued progress   films.     12/3 Chest X-Ray:  Mild cardiomegaly with improving findings of CHF.      Tele 03/26/24: SR, HR 80s    11/30 ECG: SR with 1st degree AVB, HR 83                  [1]   Social History  Socioeconomic History    Marital status: Widowed    Number of children: 7   Occupational History    Occupation: [employer] DRIVER     Employer: DISABLED   Tobacco Use    Smoking status: Some Days     Current packs/day: 5.00     Average packs/day: 5.0 packs/day for 51.9 years (259.6 ttl pk-yrs)     Types: Cigarettes     Start date: 04/24/1972    Smokeless tobacco: Never    Tobacco comments:     Patient states he had somked up to 5 ppd at his  peak      *09/30/2021 5 cigarettes daily    Substance and Sexual Activity    Alcohol use: Not Currently    Drug use: Not Currently     Frequency: 2.0 times per week     Types: Marijuana     Comment: Smokes marijuana 1-2x/week at bedtime.Quit cocaine 1991    Sexual activity: Not Currently     Partners: Female   [2]   Allergies  Allergen Reactions    Contrast Dye Iv, Iodine Containing [Iodinated Contrast Media] HIVES    Isovue-128 [Iopamidol] HIVES     Returned to CICU post cardiac cath with hives on 12/09/2007 @ 1700    Nitroglycerin  HEADACHE     migraines   [3]   Medications Prior to Admission   Medication Sig Dispense Refill Last Dose/Taking    albuterol  0.083% (PROVENTIL ) 2.5 mg /3 mL (0.083 %) nebulizer solution Inhale 3 mL solution by nebulizer as directed every 4 hours. (Patient taking differently: Inhale 3 mL solution by nebulizer as directed every 4 hours as needed.) 180 mL 11 >1 Month    albuterol  sulfate (PROAIR  HFA) 90 mcg/actuation HFA aerosol inhaler Inhale one puff to two puffs by mouth into the lungs every 4 hours as needed for Wheezing (or shortness of breath). 1 g 3 03/23/2024 Morning    betamethasone dipropionate (DIPROSONE) 0.05 % topical ointment USE TO WORST ITCHY AREAS ON BODY TWICE DAILY AS NEEDED   Past Week    budesonide -glycopyr-formoterol  (BREZTRI  AEROSPHERE) 160-9-4.8 mcg/actuation inhaler Inhale two puffs by mouth into the lungs twice daily. 10.7 g 11 03/23/2024 Evening    carvediloL  (COREG ) 25 mg tablet Take one tablet by mouth twice daily with meals.   03/23/2024 at  6:00 PM    cetirizine (ZYRTEC) 10 mg tablet Take one tablet by mouth daily.   03/23/2024 Morning    clopiDOGreL  (PLAVIX ) 75 mg tablet TAKE 1 TABLET BY MOUTH EVERY DAY 90 tablet 3 03/22/2024    DEXCOM G7 SENSOR sensor device Use one each as directed daily.   Taking    dulaglutide (TRULICITY) 0.75 mg/0.5 mL injection pen Inject 0.5 mL under the skin every 7 days.   03/17/2024    DUPIXENT PEN 300 mg/2 mL injectable PEN Inject 2 mL under the skin every 7 days.   03/17/2024    ELIQUIS  5 mg tablet TAKE 1 TABLET (5 MG) BY MOUTH TWICE DAILY   03/23/2024 at  6:00 PM    fluticasone  propionate (FLONASE) 50 mcg/actuation nasal spray, suspension Apply one spray to each nostril as directed daily.   03/23/2024 Morning    furosemide  (LASIX ) 20 mg tablet Take two tablets by mouth every morning. Take for lower extremity swelling   03/23/2024 Morning    gabapentin  (NEURONTIN ) 300 mg capsule Take one capsule by mouth three times daily. (Patient taking differently: Take two capsules by mouth twice daily as needed. Indications: PAIN)   Past Week    HYDROcodone /acetaminophen  (NORCO) 7.5/325 mg tablet Take one tablet by mouth twice daily as needed for Pain. Moderate to severe pain.   Past Week    isosorbide  mononitrate ER (IMDUR ) 30 mg tablet TAKE 1 TABLET BY MOUTH EVERY MORNING (Patient taking differently: Take two tablets by mouth at bedtime daily.) 30 tablet 11 03/23/2024 Evening    mupirocin  (CENTANY ) 2 % topical ointment Apply  topically to affected area three times daily. Apply to the front of the nose as needed up to three times per day 22 g 1 Past Week  Nebulizer Accessories kit Please dispense one nebulizer kit 1 kit 0 Taking    nitroglycerin  (NITROSTAT ) 0.4 mg tablet Place one tab under tongue every 5 mins not to exceed 3 tabs;as needed for chest pain. If chest pain persists go to the ER or call 911. 25 tablet 3     rosuvastatin  (CRESTOR ) 40 mg tablet TAKE 1 TABLET BY MOUTH EVERY DAY (Patient taking differently: Take one tablet by mouth at bedtime daily.) 90 tablet 3 03/22/2024    sacubitriL -valsartan  (ENTRESTO ) 97-103 mg tablet Take one tablet by mouth twice daily. 180 tablet 3 03/23/2024 at  6:00 PM    tamsulosin (FLOMAX) 0.4 mg capsule Take one capsule by mouth daily. (Patient taking differently: Take one capsule by mouth daily as needed.)   Past Month    tiZANidine (ZANAFLEX) 4 mg tablet Take one tablet by mouth every 6-8 hours as needed. NTE 3 doses in 24hrs.   Past Month    traZODone (DESYREL) 100 mg tablet Take two tablets by mouth at bedtime daily. (Patient taking differently: Take two tablets by mouth at bedtime as needed.)   Past Week

## 2024-03-26 NOTE — Progress Notes [1]
 03/26/24 1400   Current Cardiac Procedures/Events   Pre Admit Dx Other (comment)  (HF Exacerbation)   EF 35 %   Risk Factors   Risk Factors Smoker;Hypertension;Hyperlipidemia;Obesity   Education   Person Instructed Patient   Patient Barriers To Learning None Noted   Interventions/Teaching Methods Verbal Instructions;Written Materials Provided   Patient Response Verbalized Understanding   Topics Reasons to call your doctor;Risk Factor Management - Blood Pressure;Risk Factor Management - Smoking;Risk Factor Management - Cholesterol;Risk Factor Management - Obesity;Risk Factor Management - Stress;Risk Factor Management - Physical Inactivity;Home Walking Guidelines;Outpatient Cardiac Rehab Referral Information   Teaching Completed 03/26/24   Heart Resource Manual Given 03/26/24   Outpatient Cardiopulmonary Rehab   OPCR Yes   Location [place]  [place], [place]  [facility]  [phone]   Date Faxed 03/26/24

## 2024-03-26 NOTE — Progress Notes [1]
 03/26/24 0325   Vitals   Temp 36.9 ?C (98.4 ?F)   Temperature Source Oral   Pulse 68   Respirations 16 PER MINUTE   SpO2 97 %   O2 Device CPAP/BiPAP   BP (!) 164/103   Mean NBP (Calculated) (!) 123 MM HG   BP Source Arm, Left Upper   BP Method Automatic   BP Patient Position Head of bed (Comment degree)     Patient had a 6 beat run of Vtach. C/o of tightness along his left chest that was resolving. Notified Dr Duwaine Musca

## 2024-03-26 NOTE — Progress Notes [1]
 CARDIOPULMONARY REHABILITATION  INPATIENT ASSESSMENT    Cardiac Rehabilitation Staff: Magdalene Quay Discharge Date:     Demographics  Pre-admit Dx: Other (comment) (HF Exacerbation) Date of Admission: 03/23/2024     Room: AY5384/98 DOB:  November 01, 1955   Insurance: Primary: U[employer] Medicare  Secondary: [place] Medicaid   Address: [address]  [place] [address]   Patient Phone:  [phone] (home)        ED Contact: Jarrett Albor (daughter)  ED Phone #: [phone]   CTS: N/A Cardiologist: Maree Iha     Cardiac Procedures and Events        EF: 35 %          Risk Factors  Risk Factors: Smoker, Hypertension, Hyperlipidemia, Obesity  BP: (!) 161/91 (RN Johnathan notified )  Height: 180.3 cm (5' 11)  Weight: 109.8 kg (242 lb)  BMI (Calculated): 33.75      Medical History   has a past medical history of Arthritis, CAD (coronary artery disease), native coronary artery (12/09/2007), Cardiomyopathy, CKD (chronic kidney disease) stage 3, GFR 30-59 ml/min (CMS-HCC) (02/14/2017), COPD (chronic obstructive pulmonary disease) (CMS-HCC), Coronary artery disease, Dizziness, DM (diabetes mellitus) (CMS-HCC), Embolism and thrombosis of unspecified artery (CMS-HCC), Generalized headaches, HLD (hyperlipidemia), Hypertension, Myocardial infarction (CMS-HCC), OSA (obstructive sleep apnea), PAD (peripheral artery disease), Prostate cancer (CMS-HCC) (2017), Seasonal allergic reaction, Stroke (CMS-HCC), and Tobacco abuse.    Labs  Cholesterol   Date Value Ref Range Status   06/26/2023 82 <200 mg/dL Final     Triglycerides   Date Value Ref Range Status   06/26/2023 74 <150 mg/dL Final     HDL   Date Value Ref Range Status   06/26/2023 30 (L) >40 mg/dL Final     LDL   Date Value Ref Range Status   06/26/2023 42 <100 mg/dL Final     Hemoglobin J8R   Date Value Ref Range Status   03/24/2024 6.8 (H) 4.0 - 5.7 % Final     Comment:     The ADA recommends that most patients with type 1 and type 2 diabetes maintain an A1c level <7%.     Poc Hemoglobin A1C   Date Value Ref Range Status   05/04/2022 7.8 (A) 4 - 6 % Final     Troponin-I   Date Value Ref Range Status   05/11/2017 0.03 0.0 - 0.05 NG/ML Final         Heart Resource Manual Given: 03/26/24     Teaching Completed: 03/26/24     Outpatient Cardiopulmonary Rehabilitation    Outpatient Cardic Rehab: Yes    Referral Faxed to:   [place]  [place], [place]-  [facility]  [phone]   Date Faxed: 03/26/24    Location: [place], [place]-  [facility]  [phone]    If [place], Sent to Staff:       Magdalene Quay, RN  03/26/2024

## 2024-03-26 NOTE — Care Plan [600008]
 Problem: Discharge Planning  Goal: Participation in plan of care  Outcome: Goal Ongoing  Goal: Knowledge regarding plan of care  Outcome: Goal Ongoing  Goal: Prepared for discharge  Outcome: Goal Ongoing     Problem: Glucose Management  Goal: Absence of hyperglycemia  Outcome: Goal Ongoing  Goal: Absence of Hypoglycemia  Outcome: Goal Ongoing  Goal: Glucose level within specified parameters  Outcome: Goal Ongoing     Problem: Tobacco Use  Goal: Knowledge of tobacco-use cessation methods  Outcome: Goal Ongoing     Problem: Fluid Volume, Imbalanced  Goal: Absence of fluid overload  Outcome: Goal Ongoing

## 2024-03-26 NOTE — Progress Notes [1]
 PHYSICAL THERAPY  NOTE/DISCHARGE      Name: Sean Henderson   MRN: [id number]     DOB: 1955-11-19      Age: 68 y.o.  Admission Date: 03/23/2024     LOS: 2 days     Date of Service: 03/26/2024      Patient denies changes from baseline mobility and reports no history of balance loss or falls in the last three months. Patient endorses no concerns with mobility at home. Currently, the patient is ambulating (in room/ on unit) without difficulty.       Encouraged patient to continue mobilization while in the hospital and discussed the mobility plan with the bedside nursing staff. Physical therapy services will be discontinued at this time, please re-consult if the patient has a change in mobility status.        Therapist: Rocky Hasten, PT, DPT [phone]  Date: 03/26/2024

## 2024-03-26 NOTE — Progress Notes [1]
 OCCUPATIONAL THERAPY      Per discussion with PT, patient reports no concerns with completing activities of daily living with no OT goals identified. OT will discontinue service, please re-consult if the patient has a decline in functional status.        [place], [place] R/L [address]

## 2024-03-26 NOTE — Progress Notes [1]
 Heart Failure Nursing Progress Note    Admission Date: 03/23/2024  LOS: 2 days    Admission Weight: 108.9 kg (240 lb)        Most recent weights (inpatient):   Vitals:    03/24/24 1110 03/25/24 0417 03/26/24 0412   Weight: 110.2 kg (243 lb) 110 kg (242 lb 9.6 oz) 108.7 kg (239 lb 9.6 oz)     Weight change from previous day:-1.3 kg    Fluid restriction ordered: no    Intake/Output Summary: (Last 24 hours)    Intake/Output Summary (Last 24 hours) at 03/26/2024 9364  Last data filed at 03/26/2024 9642  Gross per 24 hour   Intake 1569 ml   Output 3200 ml   Net -1631 ml       Is patient incontinent No    Heart Failure Education provided: Yes     - Medications (including diuretics) reviewed during administration  - Fluid restriction discussed  - Low sodium diet discussed    Anticipated discharge date: 12/4  Discharge goals: breathe better      Daily Assessment of Patient Stated Goals:    Short Term Goal Identified by patient (Short Term=during hospitalization):  Lose water  weight

## 2024-03-26 NOTE — Progress Notes [1]
 Internal Medicine Daily Progress Note     Patient's Name:  Sean Henderson    MRN: [id number]   Today's Date:  03/26/2024  Admission Date: 03/23/2024  LOS: 2 days      Assessment and Plan     Sean Henderson is a 68 y.o. 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 Symptoms likely exacerbated by recent URI and underlying COPD.    Acute on chronic HFrEF exacerbation vs flash pulmonary edema  Iron  deficiency   Acute hypoxemic resp failure (resolved)  CAD s/p PCI LAD (2007, 2009)  PAD s/p multiple left SFA stents (2014, 2016)  -Presented with acute worsening shortness of breath, dyspnea on exertion, and orthopnea. Seen by urgent care on 11/18 for increased SOB and productive cough. Treated with Augmentin , although reports limited improvement in symptoms.  -On arrival, initially required 4 L, quickly weaned to room air.   -BNP 11,000. Trops slightly elevated but non-dynamic. EKG unremarkable.  -CXR on admission notable for pulmonary edema  -Echo (12/1): EF 35%, no intracardiac thrombus.   -12/2: Clinically improving following IV Lasix . Net negative ~1L since admission. Weight stable at ~243 lbs. Reports dry weight of approximately 241 lbs  -Iron  panel: Iron  32, % sat 10, ferritin 87.  -PTA meds: Lasix  40 mg daily, carvedilol  25 mg BID, Entresto  97-103 mg BID, Imdur  60 mg, Eliquis  5 mg BID, Plavix  75 mg. Reportedly no longer taking Jardiance  or spironolactone .  -CXR 12/3 with some residual pulmonary edema but notably improved from admission.   Plan  > HF consult for further workup and management prior to discharge given ongoing dyspnea on exertion without overt volume overload  > Continue PTA Lasix  40 mg PO daily. Planning to give additional IV Lasix  40 mg this evening.  > Continue spironolactone  25 mg daily  > Continue carvedilol  37.5 mg BID  > Continue PTA Entresto   > Continue PTA isosorbide  dinitrate 60 mg daily  > Continue PTA Eliquis  5 mg BID and Plavix  75 mg daily  > Continue PTA rosuvastatin  40 mg  > IV iron  sucrose 300 mg x3 doses given iron  deficiency in the setting of HF  > Appt requested for follow-up in HF clinic. Had been previously following with Dr. Fayette (last seen 02/13/24) and appears plan was for him to establish care with Dr. Dimza for HF management going forward.    HTN  -Persistently elevated BP throughout admission despite continuing home meds, increasing carvedilol , and adding spironolactone .   -Obtained renal artery duplex to r/o RAS, which was negative.   PLAN  > Continue antihypertensives, as above. May need further up-titration but will likely defer this to outpatient cardiology follow-up.    CKD 3B  -Cr 1.70 on admit, which appears to be his baseline  Plan  > Continue monitoring I/Os and daily weights      COPD  Tobacco use  -Active smoker (1-1.5 packs per day)  -Minimal expiratory wheezing on exam. Suspect COPD and active smoking may be contributing to presenting symptoms.   Plan  > Continue PTA Breztri   > Albuterol  nebs PRN  > Nicotine  patch. Would like rx at discharge.  T2DM  -A1c 6.8 (03/24/24)  -PTA: Trulicity 0.75 mg weekly  Plan  > Low-dose correction factor    Gout  -Recently presented to urgent care 11/24 acute right elbow pain, concerning for gout flare. Prescribed a short course of steroids. Symptoms now improved.  Plan  > Reported not taking allopurinol  on admission. Therefore, will defer re-initiation of this to outpatient setting. May need to resume with concurrent course of steroids to help prevent acute gout flare.       FEN: DIET NPO AT MIDNIGHT Sips With Medications  VTE ppx: PTA Eliquis   Last bowel movement: (pta)    Code status: Full Code  Disposition: Continue inpatient admission    Seen and discussed with Dr. Otelia DELENA Scurry, MD.    Sharyne Endo, MD  Internal Medicine PGY-1    Subjective   IV iron  infusion paused yesterday evening due to epigastric discomfort and SOB at rest. Symptoms seemed to gradually improve after several hours. This morning, still endorsing SOB on exertion and occasional SOB at rest, but symptoms are improved from admission. Although symptoms have improved, he is concerned about discharging home at this time and would prefer to see cardiology here in the hospital prior to leaving.    Objective     Vitals:    03/25/24 2300 03/26/24 0325 03/26/24 0412 03/26/24 0415   BP: (!) 170/89 (!) 164/103  (!) 163/94   BP Source: Arm, Left Upper Arm, Left Upper  Arm, Left Upper   Pulse: 70 68  70   Temp: 36.3 ?C (97.3 ?F) 36.9 ?C (98.4 ?F)     SpO2: 97% 97%     O2 Device: None (Room air) CPAP/BiPAP     O2 Liter Flow:       Weight:   108.7 kg (239 lb 9.6 oz)    Height:          Vitals:    03/24/24 1110 03/25/24 0417 03/26/24 0412   Weight: 110.2 kg (243 lb) 110 kg (242 lb 9.6 oz) 108.7 kg (239 lb 9.6 oz)   (-0.2kg loss)     Intake/Output Summary (Last 24 hours) at 03/26/2024 9394  Last data filed at 03/26/2024 9642  Gross per 24 hour   Intake 1569 ml   Output 3200 ml   Net -1631 ml      PHYSICAL EXAM:  General: NAD, well developed, alert  HEENT: Normocephalic, atraumatic.   Cardiac: Regular rate.  Lungs: Normal WOB on room air at rest  Psychiatric: Normal mood and affect.     CBC     Lab Results   Component Value Date/Time    WBC 5.70 03/25/2024 06:02 AM    RBC 5.12 03/25/2024 06:02 AM    HGB 13.8 03/25/2024 06:02 AM    HCT 42.4 03/25/2024 06:02 AM    MCV 82.7 03/25/2024 06:02 AM    MCH 27.0 03/25/2024 06:02 AM    MCHC 32.6 03/25/2024 06:02 AM    RDW 16.6 (H) 03/25/2024 06:02 AM    PLTCT 192 03/25/2024 06:02 AM    MPV 8.3 03/25/2024 06:02 AM    Lab Results   Component Value Date/Time    NEUT 87.2 (H) 03/24/2024 12:08 AM    ANC 6.10 03/24/2024 12:08 AM    LYMA 6.8 (L) 03/24/2024 12:08 AM    ALC 0.50 (L) 03/24/2024 12:08 AM    MONA 4.7 03/24/2024 12:08 AM    AMC 0.30 03/24/2024 12:08 AM    EOSA 0.3 03/24/2024 12:08 AM  AEC 0.00 03/24/2024 12:08 AM    BASA 1.0 03/24/2024 12:08 AM    ABC 0.10 03/24/2024 12:08 AM          BMP    Lab Results   Component Value Date/Time    NA 143 03/25/2024 02:43 PM    K 3.6 03/25/2024 02:43 PM    CA 9.0 03/25/2024 02:43 PM    CL 106 03/25/2024 02:43 PM    CO2 27 03/25/2024 02:43 PM    GAP 10 03/25/2024 02:43 PM    EGFR1 28 (L) 07/12/2022 10:15 AM    CR 1.90 (H) 03/25/2024 02:43 PM    BUN 36 (H) 03/25/2024 02:43 PM         Latest Reference Range & Units Most Recent   NT-Pro-BNP <125 pg/mL 11,558 (H)  03/24/24 00:08   hs Troponin I 0 Hour <20.0 ng/L 27.5 (H)  03/24/24 00:08   hs Troponin I 2 Hour <20.0 ng/L 30.2 (H)  03/24/24 02:30   (H): Data is abnormally high     LIMITED ECHO  Result Date: 03/24/2024    The left ventricular systolic function is moderately reduced. The visually estimated ejection fraction is 35%.   The LV is dilated, global hypokinesis.   No intracardiac thrombus.   Grade I (mild) left ventricular diastolic dysfunction. Cannot determine left atrial pressure.   The right ventricular size is normal. The right ventricular systolic function is normal.   This was a 2D echo study only, cardiac valve structures were not fully evaluated.  Compared to the previous study dated 10/04/2023: LVEF = 34%, global hypokinesis, no thrombus was present.     CHEST SINGLE VIEW  Result Date: 03/24/2024  CHEST SINGLE VIEW Clinical history: SHORTNESS OF BREATH. Comparison: 03/11/2024 Findings: Cardiomegaly, with development of venous congestion. Development of bibasilar infiltrates/edema, without consolidation, or pneumothorax. Possible small bilateral pleural effusions. No pneumothorax.     Development of congestive heart failure. Recommend continued progress films.  Finalized by Cathaleen JONETTA Race, MD on 03/24/2024 1:10 AM. Dictated by Cathaleen JONETTA Race, MD on 03/24/2024 1:08 AM.    CHEST 2 VIEWS  Result Date: 03/11/2024  CHEST 2 VIEWS Clinical history: cough Technique: PA and lateral views of the chest. Comparison: Chest x-ray from 07/12/2022. Findings: Heart is again mildly enlarged with otherwise unremarkable mediastinal contours. No significant pulmonary venous congestion/pulmonary edema. No pleural effusion, pneumothorax, or focal consolidation. Multilevel thoracic spondylosis. No obvious destructive osseous lesion or acute bony abnormality appreciated. Postsurgical changes at the left shoulder.     1. No focal lung consolidation. 2. Persistent mild cardiomegaly.  Finalized by Norleen Pane, M.D. on 03/11/2024 6:04 PM. Dictated by Norleen Pane, M.D. on 03/11/2024 6:03 PM.       Meds:  Scheduled Meds:albuterol  0.083% (PROVENTIL ) nebulizer solution 2.5 mg, 2.5 mg, Inhalation, TID & PRN  apixaban  (ELIQUIS ) tablet 5 mg, 5 mg, Oral, BID  carvediloL  (COREG ) tablet 37.5 mg, 37.5 mg, Oral, BID w/meals  clopiDOGreL  (PLAVIX ) tablet 75 mg, 75 mg, Oral, QDAY  fluticasone  furoate (ARNUITY ELLIPTA ) 100 mcg/actuation inhaler 1 puff, 1 puff, Inhalation, QDAY   And  umeclidinium-vilanteroL (ANORO ELLIPTA ) 62.5-25 mcg/actuation inhaler 1 puff, 1 puff, Inhalation, QDAY  furosemide  (LASIX ) tablet 40 mg, 40 mg, Oral, QAM8  insulin  aspart (U-100) (NOVOLOG  FLEXPEN U-100 INSULIN ) injection PEN 0-6 Units, 0-6 Units, Subcutaneous, ACHS (22)  iron  sucrose (VENOFER ) 300 mg in sodium chloride  0.9% 265 mL IVPB, 300 mg, Intravenous, QDAY  isosorbide  mononitrate ER (IMDUR ) tablet 60 mg, 60 mg, Oral,  QDAY  nicotine  (NICODERM CQ ) 14 mg/day patch 1 patch, 1 patch, Transdermal, QDAY  polyethylene glycol 3350  (MIRALAX ) packet 17 g, 1 packet, Oral, QDAY  rosuvastatin  (CRESTOR ) tablet 40 mg, 40 mg, Oral, QHS  sacubitriL -valsartan  (ENTRESTO ) 97-103 mg tablet 1 tablet, 1 tablet, Oral, BID  sennosides-docusate sodium  (SENOKOT-S) tablet 1 tablet, 1 tablet, Oral, BID  spironolactone  (ALDACTONE ) tablet 25 mg, 25 mg, Oral, QDAY    Continuous Infusions:  PRN and Respiratory Meds:albuterol -ipratropium Q4H PRN, dextrose  50% PRN, diphenhydrAMINE  HCL Q4H PRN, EPINEPHrine  Q5 MIN PRN, HYDROcodone /acetaminophen  BID PRN, melatonin QHS PRN, ondansetron  Q6H PRN **OR** ondansetron  Q6H PRN

## 2024-03-27 ENCOUNTER — Inpatient Hospital Stay: Admit: 2024-03-27 | Discharge: 2024-03-28 | Payer: MEDICARE

## 2024-03-27 LAB — POC GLUCOSE
~~LOC~~ BKR POC GLUCOSE: 190 mg/dL — ABNORMAL HIGH (ref 70–100)
~~LOC~~ BKR POC GLUCOSE: 131 mg/dL — ABNORMAL HIGH (ref 70–100)
~~LOC~~ BKR POC GLUCOSE: 136 mg/dL — ABNORMAL HIGH (ref 70–100)
~~LOC~~ BKR POC GLUCOSE: 154 mg/dL — ABNORMAL HIGH (ref 70–100)

## 2024-03-27 LAB — DHEA SULFATE: ~~LOC~~ BKR DEHYDROEPIAND SUL: 57 g/dL (ref 24–244)

## 2024-03-27 LAB — LIPOPROTEIN A: ~~LOC~~ BKR LIPOPROTEIN A: 117 mg/dL — ABNORMAL HIGH (ref <=29)

## 2024-03-27 LAB — CORTISOL-AM: ~~LOC~~ BKR CORTISOL-AM: 1.7 g/dL — ABNORMAL LOW (ref 6.7–22.6)

## 2024-03-27 NOTE — Progress Notes [1]
 Heart Failure Progress Note    NAME:Sean Henderson             MRN: [id number]                 DOB:Sep 14, 1955          AGE: 68 y.o.  ADMISSION DATE: 03/23/2024             DAYS ADMITTED: LOS: 3 days      Principal Problem:    Acute on chronic clinical systolic heart failure (CMS-HCC)  Active Problems:    CAD (coronary artery disease), native coronary artery    COPD (chronic obstructive pulmonary disease) (CMS-HCC)    SOB (shortness of breath)    Peripheral artery disease (HCC) with repeat stenting left leg    Stage 3b chronic kidney disease (CMS-HCC)    Prostate cancer (CMS-HCC)    Acute respiratory failure with hypoxia (CMS-HCC)    Acute on chronic systolic congestive heart failure (CMS-HCC)      HPI:  Sean Henderson is a 68 y.o. male with a history of combined ICM and NICM, HFrEF (LVEF 35%), CAD with prior LAD stent placement x 2 with bare metal stents in 2007 and 2009, PAD with prior multiple SFA stents (including viabahn stents), hypertension, dyslipidemia, obstructive sleep apnea on CPAP therapy, stage IIIB CKD, gout, renal cysts, vitamin D  deficiency, prostate cancer and COPD/tobacco dependence.     He presented to the ED with worsening shortness of breath on 12/1. He initially began experiencing shortness of breath on 11/18. At that time, he was diagnosed with acute bronchitis and treated with Augmentin . NTproBNP on admission: 11,558. Hs-troponin: 27.5>30.2>25.7. CXR consistent with volume overload. He was hypertensive on arrival, BP 176/99.     PTA cardiac medications: carvedilol  25 mg twice daily, Plavix  75 mg daily, Eliquis  5 mg twice daily, lasix  40 mg daily, Imdur  30 mg daily, rosuvastatin  40 mg daily, Entresto  97-103 mg twice daily and spironolactone  25 mg daily (patient not taking PTA)     Recommendations 03/27/24:  Hold Eliquis  in preparation for RHC and LHC on Friday, 12/5. Contrast allergy pre-treatment ordered.   Clear liquids only at midnight, then NPO at 0900 on 12/5.  Diuresis: lasix  40 mg IV daily.   GDMT for HFrEF: continue carvedilol  37.5 mg twice daily, Entresto  97-103 mg twice daily, spironolactone  25 mg daily, Isordil  10 mg TID and hydralazine  25 mg TID.  Will need to consider LifeVest prior to discharge.   Cardiac rehab ordered 12/3.   CAD/PAD: continue Plavix  75 mg daily and rosuvastatin  40 mg daily.   Iron  repletion ordered per primary team.   Will continue to follow along.     Ongoing:  BMP once a day. Magnesium  level daily.  Keep Potassium greater than 4.0 and Magnesium  greater than 2.0.  2000mg  sodium dietary restriction.  Fluid Restriction: 2 L  Strict I/O. Goal output: net neg 2 and 3L/24 hour  Daily standing scale weight. Goal Dry Weight: to be determined, previously thought to be 240 lbs     Please place a specimen in lab NT-proBNP order on day of discharge in effort to establish baseline value correlating with suspected euvolemia.      Primary team responsible for placing [facility]charge [facility] Appointment Request order set for Cardiology: Heart Failure appointment request within 24-48 hours of discharge. (Please do not place order any earlier in effort to reduce possible need for cancellation and rescheduling of visit).      Pt examined  and discussed with Dr. Jamas Fairly.      Stefano JINNY Hane, APRN-NP  Available on Voalte  HF Consult Team      Assessment:   Acute on chronic systolic and diastolic HFrEF,  EF: 35%.  Combined ischemic and non-ischemic cardiomyopathy   Major Complications or Comorbidities [facility]): acute/ acute on chronic systolic and/or diastolic heart failure  NYHA functional class III (marked limitation of physical activity - comfortable at rest, but less than ordinary activity causes symptoms of HF e.g., getting dressed or standing from a sitting position),   ACC Stage C (structural heart disease with prior or current symptoms of HF).   He presents with signs of hypervolemia with left  ventricular failure without signs of low flow state.      BNP Labs:   Lab Results   Component Value Date    NTPROBNP 5,694 (H) 03/26/2024    NTPROBNP 11,558 (H) 03/24/2024    NTPROBNP 916 (H) 06/26/2023        Lab Results   Component Value Date    HSTROP0HR 27.5 (H) 03/24/2024    HSTROP2HR 30.2 (H) 03/24/2024    HSTROP4HR 25.7 (H) 03/24/2024    HSTROPDELT4 -4.5 03/24/2024    HSTROPDELTA 2.7 03/24/2024     Admission Weight: 108.9 kg (240 lb)        Most recent weights (inpatient):   Vitals:    03/26/24 0731 03/26/24 1415 03/27/24 0500   Weight: 109.8 kg (242 lb) 109.8 kg (242 lb) 107.4 kg (236 lb 12.8 oz)        Strict I/O:   net for last 24 hrs: -930 mL  UOP last 24 hrs: 1650 mL  net I/O for entire hospital stay: -3959 mL      Diuretic Therapy    Prior to admission dose    Lasix  40 mg PO daily    Given on admission    Lasix  40 mg IV x 1    Daily Dosing    12/1 Lasix  40 mg IV BID       12/2 Lasix  40 mg PO daily      12/3 Lasix  40 mg PO x 1 in AM, Lasix  40 mg IV x 1 in PM     12/4 Lasix  40 mg IV daily        Guideline Directed Medical Therapy PTA Inpatient Changes   ACEI/ARB No, on ARNI    ARNI Entresto  97-103 mg twice daily Continued    BB Carvedilol  25 mg twice daily 12/2 increased to 37.5 mg twice daily   SGLT-2 Inhibitor Previously on Jardiance , unclear why medication was stopped     Mineralocorticoid Receptor Antagonist Previously on spironolactone , unclear why medication was stopped  12/2 spironolactone  25 mg daily    Hydralazine /Nitrate Imdur  30 mg daily 12/2 Imdur  60 mg daily  12/3 Imdur  discontinued in favor of Isordil  10 mg TID, hydralazine  25 mg TID   Ivabradine No    Heart Rhythm Management Therapy No Consider LifeVest this admission   Anticoagulation for LV thrombus Eliquis  5 mg twice daily Continued on admission  12/3 held in preparation for RHC    Cardiac Rehab Evaluation for LVEF < or equal to 40% Yes; Date ordered: 03/26/24    7-Day post hospital follow up scheduled within 24-48 hours of discharge       Testing/Procedures   03/24/24 LIMITED TTE    The left ventricular systolic function is moderately reduced. The visually estimated ejection fraction is 35%.  The LV is dilated, global hypokinesis.    No intracardiac thrombus.    Grade I (mild) left ventricular diastolic dysfunction. Cannot determine left atrial pressure.    The right ventricular size is normal. The right ventricular systolic function is normal.    This was a 2D echo study only, cardiac valve structures were not fully evaluated.      Compared to the previous study dated 10/04/2023: LVEF = 34%, global hypokinesis, no thrombus was present.    08/30/23 MPI Stress Test   SUMMARY/OPINION:  This study is abnormal.  There is a moderate size, mild intensity, predominantly fixed of decreased radiotracer uptake in the basal to apical inferior wall segments consistent with an area of myocardial injury.  Global left ventricular systolic function is moderately reduced.  Calculated ejection fraction 29%.  Hypokinesis is slightly more pronounced in the inferior wall segments.  No evidence of transient ischemic dilatation.  The pharmacologic ECG portion of the study is nondiagnostic for ischemia.     Comparison is made with a prior D SPECT study completed 11/15/2021.  Ejection fraction was 35%.  Calculated ejection fraction is less on current study at 29%.  The perfusion pattern is quite similar.     CAD  - s/p PCI LAD (2007, 2009)  - Plavix  75 mg daily and rosuvastatin  20 mg daily   > LHC 12/5    Hx LV thrombus  - PTA Eliquis  5 mg twice as above  - 12/3 limited TTE noted resolution of thrombus. Given low EF, would continue [facility] indefinitely     Hypertension  - medications as above in GDMT table     PAD   - s/p multiple left SFA stents (2014, 2016)  - Plavix  75 mg daily and rosuvastatin  20 mg daily     CKD - Stage 3b  - baseline Cr: 1.6-2.0  - Cr on admission: 1.70  Recent Labs     03/24/24  1621 03/25/24  0602 03/25/24  1443 03/26/24  0544 03/27/24  0602   CR 1.84* 1.85* 1.90* 1.84* 1.84*   BUN 28* 35* 36* 32* 36*   - monitor closely with aggressive diuresis     Iron  deficiency  Lab Results   Component Value Date    IRON  32 (L) 03/25/2024    PSAT 10 (L) 03/25/2024    TIBC 323 03/25/2024    FERRITIN 87 03/25/2024   > Venofer  300 mg x 3 doses ordered per primary team (12/2-12/4)    COPD  Tobacco use   - 1-1.5 ppd   > management per primary team     Type 2 diabetes mellitus  Lab Results   Component Value Date/Time    HGBA1C 6.8 (H) 03/24/2024 05:07 AM    HGBA1C 7.4 (H) 06/26/2023 03:32 PM    HGBA1C 7.3 (H) 05/11/2017 02:55 AM    A1C 7.8 (A) 05/04/2022 12:00 AM   > management per primary team          _____________________________________________________________________________    Reason for Consultation:  Evaluation and recommendations re: heart failure     Subjective:  Sean Henderson reports he has had worsening shortness of breath on exertion for the last several months with associated chest discomfort and pounding sensation in his chest. He currently complains of dyspnea on exertion, chest pain, abdominal fullness, and weight change. He tells me his symptoms are about the same today. He currently denies orthopnea, lightheadedness, positional lightheadedness, near syncope, and peripheral edema.    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)    Generalized headaches    HLD (hyperlipidemia)    Hypertension    Myocardial infarction (CMS-HCC)    OSA (obstructive sleep apnea)    PAD (peripheral artery disease)    Prostate cancer (CMS-HCC)    Seasonal allergic reaction    Stroke (CMS-HCC)    Tobacco abuse     Surgical History:   Procedure Laterality Date    Lt Heart Cath With Ventriculogram Left 12/08/2014    Performed by Erling Locus, MD at [facility] CATH LAB    Coronary Angiography N/A 12/08/2014    Performed by Erling Locus, MD at [facility] CATH LAB    Percutaneous Coronary Intervention N/A 12/08/2014    Performed by Erling Locus, MD at [facility] CATH LAB    PROSTATE BIOPSY  2017    ANGIOGRAPHY CORONARY ARTERY WITH LEFT HEART CATHETERIZATION, REQUEST RADIAL APPROACH N/A 02/14/2017    Performed by Erling Locus, MD at [facility] CATH LAB    POSSIBLE PERCUTANEOUS CORONARY STENT PLACEMENT WITH ANGIOPLASTY N/A 02/14/2017    Performed by Erling Locus, MD at [facility] CATH LAB    ANGIOGRAPHY CORONARY ARTERY WITH LEFT HEART CATHETERIZATION N/A 01/10/2018    Performed by Freeman Donnice NOVAK, MD, FACC at [facility] CATH LAB    POSSIBLE PERCUTANEOUS CORONARY STENT PLACEMENT WITH ANGIOPLASTY N/A 01/10/2018    Performed by Freeman Donnice NOVAK, MD, FACC at [facility] CATH LAB    HX HEART CATHETERIZATION       Family History   Problem Relation Name Age of Onset    Heart Attack Mother      Hypertension Mother      Heart Attack Father      Hypertension Father      Heart Attack Sister      Hypertension Sister      Heart Attack Brother      Hypertension Brother      Diabetes Mother       Social History[1]     Objective:    Allergies: Allergies[2]     Medications:  Scheduled Meds:albuterol  0.083% (PROVENTIL ) nebulizer solution 2.5 mg, 2.5 mg, Inhalation, TID & PRN  [Held by Provider] apixaban  (ELIQUIS ) tablet 5 mg, 5 mg, Oral, BID  [START ON 03/28/2024] aspirin  tablet 325 mg, 325 mg, Oral, ONCE  carvediloL  (COREG ) tablet 37.5 mg, 37.5 mg, Oral, BID w/meals  clopiDOGreL  (PLAVIX ) tablet 75 mg, 75 mg, Oral, QDAY  [START ON 03/28/2024] diphenhydrAMINE  HCL (BENADRYL ) capsule 50 mg, 50 mg, Oral, ONCE  [START ON 03/28/2024] famotidine  (PEPCID ) tablet 20 mg, 20 mg, Oral, ONCE  fluticasone  furoate (ARNUITY ELLIPTA ) 100 mcg/actuation inhaler 1 puff, 1 puff, Inhalation, QDAY   And  umeclidinium-vilanteroL (ANORO ELLIPTA ) 62.5-25 mcg/actuation inhaler 1 puff, 1 puff, Inhalation, QDAY  furosemide  (LASIX ) injection 40 mg, 40 mg, Intravenous, QDAY  guaiFENesin  LA (MUCINEX ) tablet 600 mg, 600 mg, Oral, BID  hydrALAZINE  (APRESOLINE ) tablet 25 mg, 25 mg, Oral, TID  insulin  aspart (U-100) (NOVOLOG  FLEXPEN U-100 INSULIN ) injection PEN 0-6 Units, 0-6 Units, Subcutaneous, ACHS (22)  isosorbide  dinitrate (ISORDIL ) tablet 10 mg, 10 mg, Oral, TID  nicotine  (NICODERM CQ ) 14 mg/day patch 1 patch, 1 patch, Transdermal, QDAY  polyethylene glycol 3350  (MIRALAX ) packet 17 g, 1 packet, Oral, QDAY  predniSONE  (DELTASONE ) tablet 60 mg, 60 mg, Oral, Q12H  rosuvastatin  (CRESTOR ) tablet 40 mg, 40 mg, Oral, QHS  sacubitriL -valsartan  (ENTRESTO ) 97-103 mg tablet 1 tablet, 1 tablet, Oral, BID  sennosides-docusate sodium  (SENOKOT-S) tablet 1 tablet, 1 tablet, Oral, BID  spironolactone  (ALDACTONE ) tablet 25 mg, 25 mg, Oral, QDAY    Continuous Infusions:   [START ON 03/28/2024] sodium chloride  0.9% infusion       PRN and Respiratory Meds:albuterol -ipratropium Q4H PRN, dextrose  50% PRN, diphenhydrAMINE  HCL Q4H PRN, EPINEPHrine  Q5 MIN PRN, HYDROcodone /acetaminophen  BID PRN, melatonin QHS PRN, ondansetron  Q6H PRN **OR** ondansetron  Q6H PRN    Prescriptions Prior to Admission[3]                          Vital Signs:  Last Filed                Vital Signs: 24 Hour Range   BP: 133/73 (12/04 0910)  Temp: 36.5 ?C (97.7 ?F) (12/04 9089)  Pulse: 70 (12/04 0910)  Respirations: 18 PER MINUTE (12/04 0910)  SpO2: 96 % (12/04 0910)  O2%: 21 % (12/04 0107)  O2 Device: None (Room air) (12/04 0910)  BP: (130-161)/(73-92)   Temp:  [36.3 ?C (97.4 ?F)-36.7 ?C (98 ?F)]   Pulse:  [65-70]   Respirations:  [16 PER MINUTE-18 PER MINUTE]   SpO2:  [95 %-98 %]   O2%:  [21 %]   O2 Device: None (Room air)    Intensity Pain Scale (Self Report): 5 (03/26/24 2042)      Wt Readings from Last 10 Encounters:   03/27/24 107.4 kg (236 lb 12.8 oz)   03/11/24 111.2 kg (245 lb 2.4 oz)   03/11/24 111.2 kg (245 lb 3.2 oz)   02/13/24 111.9 kg (246 lb 11.2 oz)   01/07/24 110.2 kg (243 lb)   12/12/23 115.6 kg (254 lb 12.8 oz)   10/04/23 111.1 kg (245 lb)   09/27/23 111.1 kg (245 lb)   09/07/23 112.9 kg (249 lb)   08/29/23 112.9 kg (249 lb)       Physical Exam:    General Appearance: no distress  Skin: warm and dry  Neck Veins: JVP 10-12 cm, HJR positive  Auscultation: breathing comfortably, lungs clear to auscultation in BUL, decreased BLL, no rales or rhonchi, no wheezing  Cardiac Auscultation: Regular rhythm, S1, S2, no S3 or S4, murmur  Pedal Pulses: pulses 2+, symmetric  Lower Extremity Edema: no   Abdominal Exam: soft, non-tender, bowel sounds normal  Orientation: clear historian, good insight    Laboratory Review:   CBC w/Diff    Lab Results   Component Value Date/Time    WBC 5.90 03/27/2024 06:02 AM    RBC 5.44 03/27/2024 06:02 AM    HGB 14.6 03/27/2024 06:02 AM    HCT 45.0 03/27/2024 06:02 AM    MCV 82.7 03/27/2024 06:02 AM    MCH 26.9 03/27/2024 06:02 AM    MCHC 32.5 03/27/2024 06:02 AM    RDW 16.7 (H) 03/27/2024 06:02 AM    PLTCT 243 03/27/2024 06:02 AM    MPV 8.2 03/27/2024 06:02 AM    Lab Results   Component Value Date/Time    NEUT 87.2 (H) 03/24/2024 12:08 AM    ANC 6.10 03/24/2024  12:08 AM    LYMA 6.8 (L) 03/24/2024 12:08 AM    ALC 0.50 (L) 03/24/2024 12:08 AM    MONA 4.7 03/24/2024 12:08 AM    AMC 0.30 03/24/2024 12:08 AM    EOSA 0.3 03/24/2024 12:08 AM    AEC 0.00 03/24/2024 12:08 AM    BASA 1.0 03/24/2024 12:08 AM    ABC 0.10 03/24/2024 12:08 AM         Chemistry    Lab Results   Component Value Date/Time    NA 142 03/27/2024 06:02 AM    K 4.4 03/27/2024 06:02 AM    CL 106 03/27/2024 06:02 AM    CO2 27 03/27/2024 06:02 AM    GAP 9 03/27/2024 06:02 AM    BUN 36 (H) 03/27/2024 06:02 AM    CR 1.84 (H) 03/27/2024 06:02 AM    GLU 104 (H) 03/27/2024 06:02 AM    MG 2.5 03/27/2024 06:02 AM    BNP 154.0 (H) 08/22/2021 03:30 PM    Lab Results   Component Value Date/Time    CA 9.5 03/27/2024 06:02 AM    PO4 2.7 03/24/2024 05:07 AM    ALBUMIN 4.1 03/27/2024 06:02 AM    TOTPROT 7.1 03/27/2024 06:02 AM    ALKPHOS 79 03/27/2024 06:02 AM    AST 11 03/27/2024 06:02 AM    ALT 9 03/27/2024 06:02 AM    TOTBILI 0.5 03/27/2024 06:02 AM    GFR 39 (L) 03/27/2024 06:02 AM    GFRAA >60 08/18/2019 02:00 PM            Renal Function    Lab Results   Component Value Date/Time    NA 142 03/27/2024 06:02 AM    K 4.4 03/27/2024 06:02 AM    CL 106 03/27/2024 06:02 AM    CO2 27 03/27/2024 06:02 AM    GAP 9 03/27/2024 06:02 AM    BUN 36 (H) 03/27/2024 06:02 AM    BUN 32 (H) 03/26/2024 05:44 AM    BUN 36 (H) 03/25/2024 02:43 PM    Lab Results   Component Value Date/Time    CR 1.84 (H) 03/27/2024 06:02 AM    CR 1.84 (H) 03/26/2024 05:44 AM    CR 1.90 (H) 03/25/2024 02:43 PM    GLU 104 (H) 03/27/2024 06:02 AM    CA 9.5 03/27/2024 06:02 AM    PO4 2.7 03/24/2024 05:07 AM    ALBUMIN 4.1 03/27/2024 06:02 AM        Lipid Profile INR   Lab Results   Component Value Date    CHOL 82 06/26/2023    TRIG 74 06/26/2023    HDL 30 (L) 06/26/2023    LDL 42 06/26/2023    VLDL 85.1 06/26/2023    NONHDLCHOL 52 06/26/2023         Lab Results   Component Value Date    INR 1.1 05/11/2017          12/1 Chest X-Ray:   Development of congestive heart failure. Recommend continued progress   films.     12/3 Chest X-Ray:  Mild cardiomegaly with improving findings of CHF.      Tele 03/27/24: SR, HR 80s    11/30 ECG: SR with 1st degree AVB, HR 83                  [1]   Social History  Socioeconomic History    Marital status: Widowed    Number of children: 7  Occupational History    Occupation: [employer] DRIVER     Employer: DISABLED   Tobacco Use    Smoking status: Some Days     Current packs/day: 5.00     Average packs/day: 5.0 packs/day for 51.9 years (259.6 ttl pk-yrs)     Types: Cigarettes     Start date: 04/24/1972    Smokeless tobacco: Never    Tobacco comments:     Patient states he had somked up to 5 ppd at his peak      *09/30/2021 5 cigarettes daily    Substance and Sexual Activity    Alcohol use: Not Currently    Drug use: Not Currently     Frequency: 2.0 times per week     Types: Marijuana     Comment: Smokes marijuana 1-2x/week at bedtime.Quit cocaine 1991    Sexual activity: Not Currently     Partners: Female   [2]   Allergies  Allergen Reactions    Contrast Dye Iv, Iodine Containing [Iodinated Contrast Media] HIVES    Isovue-128 [Iopamidol] HIVES     Returned to CICU post cardiac cath with hives on 12/09/2007 @ 1700    Nitroglycerin  HEADACHE     migraines   [3]   Medications Prior to Admission   Medication Sig Dispense Refill Last Dose/Taking    albuterol  0.083% (PROVENTIL ) 2.5 mg /3 mL (0.083 %) nebulizer solution Inhale 3 mL solution by nebulizer as directed every 4 hours. (Patient taking differently: Inhale 3 mL solution by nebulizer as directed every 4 hours as needed.) 180 mL 11 >1 Month    albuterol  sulfate (PROAIR  HFA) 90 mcg/actuation HFA aerosol inhaler Inhale one puff to two puffs by mouth into the lungs every 4 hours as needed for Wheezing (or shortness of breath). 1 g 3 03/23/2024 Morning    betamethasone dipropionate (DIPROSONE) 0.05 % topical ointment USE TO WORST ITCHY AREAS ON BODY TWICE DAILY AS NEEDED   Past Week    budesonide -glycopyr-formoterol  (BREZTRI  AEROSPHERE) 160-9-4.8 mcg/actuation inhaler Inhale two puffs by mouth into the lungs twice daily. 10.7 g 11 03/23/2024 Evening    carvediloL  (COREG ) 25 mg tablet Take one tablet by mouth twice daily with meals.   03/23/2024 at  6:00 PM    cetirizine (ZYRTEC) 10 mg tablet Take one tablet by mouth daily.   03/23/2024 Morning    clopiDOGreL  (PLAVIX ) 75 mg tablet TAKE 1 TABLET BY MOUTH EVERY DAY 90 tablet 3 03/22/2024    DEXCOM G7 SENSOR sensor device Use one each as directed daily.   Taking    dulaglutide (TRULICITY) 0.75 mg/0.5 mL injection pen Inject 0.5 mL under the skin every 7 days.   03/17/2024    DUPIXENT PEN 300 mg/2 mL injectable PEN Inject 2 mL under the skin every 7 days.   03/17/2024    ELIQUIS  5 mg tablet TAKE 1 TABLET (5 MG) BY MOUTH TWICE DAILY   03/23/2024 at  6:00 PM    fluticasone  propionate (FLONASE) 50 mcg/actuation nasal spray, suspension Apply one spray to each nostril as directed daily.   03/23/2024 Morning    furosemide  (LASIX ) 20 mg tablet Take two tablets by mouth every morning. Take for lower extremity swelling   03/23/2024 Morning    gabapentin  (NEURONTIN ) 300 mg capsule Take one capsule by mouth three times daily. (Patient taking differently: Take two capsules by mouth twice daily as needed. Indications: PAIN)   Past Week    HYDROcodone /acetaminophen  (NORCO) 7.5/325 mg tablet Take one tablet by mouth  twice daily as needed for Pain. Moderate to severe pain.   Past Week    isosorbide  mononitrate ER (IMDUR ) 30 mg tablet TAKE 1 TABLET BY MOUTH EVERY MORNING (Patient taking differently: Take two tablets by mouth at bedtime daily.) 30 tablet 11 03/23/2024 Evening    mupirocin  (CENTANY ) 2 % topical ointment Apply  topically to affected area three times daily. Apply to the front of the nose as needed up to three times per day 22 g 1 Past Week    Nebulizer Accessories kit Please dispense one nebulizer kit 1 kit 0 Taking    nitroglycerin  (NITROSTAT ) 0.4 mg tablet Place one tab under tongue every 5 mins not to exceed 3 tabs;as needed for chest pain. If chest pain persists go to the ER or call 911. 25 tablet 3     rosuvastatin  (CRESTOR ) 40 mg tablet TAKE 1 TABLET BY MOUTH EVERY DAY (Patient taking differently: Take one tablet by mouth at bedtime daily.) 90 tablet 3 03/22/2024    sacubitriL -valsartan  (ENTRESTO ) 97-103 mg tablet Take one tablet by mouth twice daily. 180 tablet 3 03/23/2024 at  6:00 PM    tamsulosin (FLOMAX) 0.4 mg capsule Take one capsule by mouth daily. (Patient taking differently: Take one capsule by mouth daily as needed.)   Past Month    tiZANidine (ZANAFLEX) 4 mg tablet Take one tablet by mouth every 6-8 hours as needed. NTE 3 doses in 24hrs.   Past Month    traZODone (DESYREL) 100 mg tablet Take two tablets by mouth at bedtime daily. (Patient taking differently: Take two tablets by mouth at bedtime as needed.)   Past Week

## 2024-03-27 NOTE — Progress Notes [1]
 Internal Medicine Daily Progress Note     Patient's Name:  Sean Henderson    MRN: [id number]   Today's Date:  03/27/2024  Admission Date: 03/23/2024  LOS: 3 days      Assessment and Plan     Ritik Stavola is a 68 y.o. 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 Symptoms likely exacerbated by recent URI and underlying COPD.    Acute on chronic HFrEF exacerbation vs flash pulmonary edema  Iron  deficiency   Acute hypoxemic resp failure (resolved)  CAD s/p PCI LAD (2007, 2009)  PAD s/p multiple left SFA stents (2014, 2016)  Hx of LV thrombus, on Eliquis , now resolved  -Presented with acute worsening shortness of breath, dyspnea on exertion, and orthopnea. Seen by urgent care on 11/18 for increased SOB and productive cough. Treated with Augmentin , although reports limited improvement in symptoms.  -On arrival, initially required 4 L, quickly weaned to room air.   -BNP 11,000. Trops slightly elevated but non-dynamic. EKG unremarkable.  -CXR on admission notable for pulmonary edema  -Echo (12/1): EF 35%, no intracardiac thrombus.   -12/2: Clinically improving following IV Lasix . Net negative ~1L since admission. Weight stable at ~243 lbs. Reports dry weight of approximately 241 lbs  -Iron  panel: Iron  32, % sat 10, ferritin 87.  -PTA meds: Lasix  40 mg daily, carvedilol  25 mg BID, Entresto  97-103 mg BID, Imdur  60 mg, Eliquis  5 mg BID, Plavix  75 mg. Reportedly no longer taking Jardiance  or spironolactone .  -CXR 12/3 with some residual pulmonary edema but notably improved from admission.   Plan  > HF following  Planning for RHC and LHC on Friday 12/5  Med recs, as below  Needs LifeVest at discharge  Cardiac rehab ordered  Specimen in lab NT-proBNP on day of discharge in effort to establish baseline value   F/u in HF clinic currently scheduled for 12/10. May need to push to later date depending on how long he's here. Next visit with Dr. Dimza on 04/30/24.  > IV Lasix  40 mg daily  > GDMT:  Spironolactone  25 mg daily  Carvedilol  37.5 mg BID  PTA Entresto   Previously on Jardiance , not currently.  > PTA Imdur  stopped. Started on Isordil  10 mg TID.  > Resumed PTA hydralazine  25 mg TID  > Continue PTA Plavix  75 mg daily  > Eliquis  5 mg BID (for LV thrombus) -- currently holding ahead of RHC/LHC. Should continue for 3 additional months from thrombus resolution.  > Continue PTA rosuvastatin  40 mg  > IV iron  sucrose 300 mg x3 doses given iron  deficiency in the setting of HF (last dose today 12/4)  > Mag >2, K >4  > 2L fluid restriction. Daily weights.     HTN  -Persistently elevated BP throughout admission despite continuing home meds, increasing carvedilol , and adding spironolactone .   -Obtained renal artery duplex to r/o RAS, which was negative.   -AM cortisol 2.4 on dexamethasone  suppression test 12/4. DHEAS 57 on 12/3. May be indicative of MACS. Has been previously noted to have mild bilateral adrenal thickening on imaging. Of note, was taking steroids up until hospitalization for acute gout flare.  PLAN  > Continue antihypertensives, as above. May need further up-titration but will likely defer this to outpatient cardiology follow-up.  > Outpatient follow-up for further MACS evaluation (unable to complete this admission due to needing steroids ahead of heart cath)    CKD 3B  -Cr 1.70 on admit, which appears to be his baseline  Plan  > Continue monitoring I/Os and daily weights      COPD  Tobacco use  -Active smoker (1-1.5 packs per day)  -Minimal expiratory wheezing on exam. Suspect COPD and active smoking may be contributing to presenting symptoms.   Plan  > Continue PTA Breztri   > Albuterol  nebs PRN  > Nicotine  patch. Would like rx at discharge.     T2DM  -A1c 6.8 (03/24/24)  -PTA: Trulicity 0.75 mg weekly  Plan  > Low-dose correction factor    Gout  -Recently presented to urgent care 11/24 for acute right elbow pain, concerning for gout flare. Prescribed a short course of steroids. Symptoms now improved.  Plan  > Reported not taking allopurinol  on admission. Therefore, will defer re-initiation of this to outpatient setting. May need to resume with concurrent course of steroids to help prevent acute gout flare.       FEN: Diet Cardiac (Low Fat/Low Sodium)  Diet Clear Liquid (nonurgent inpatient add-ons only)  VTE ppx: PTA Eliquis   Last bowel movement: 03/26/24    Code status: Full Code  Disposition: Continue inpatient admission    Seen and discussed with Dr. Salah A Daghlas, MD.    Sharyne Endo, MD  Internal Medicine PGY-1    Subjective   No overnight events. This morning on evaluation, pt reports similar symptoms as yesterday in terms of SOB with exertion, although improved overall from admission. Pt is now reporting that he's had a knot in his chest associated with dyspnea on exertion over the past few days to weeks, starting prior to admission. This usually resolves after 2-3 minutes of rest. Has noticed it on occasion when exerting himself here in the hospital. During our earlier conversations, he previously endorsed right-sided lateral chest wall pain that was tender to palpation and is now resolved, but denied any symptoms of mid/left-sided chest pressure or discomfort.     Objective     Vitals:    03/26/24 2355 03/27/24 0107 03/27/24 0500 03/27/24 0610   BP: (!) 144/80  Comment: notified Angela   130/85   BP Source: Arm, Left Upper   Arm, Left Upper   Pulse:  67  66   Temp: 36.4 ?C (97.5 ?F)   36.4 ?C (97.5 ?F)   SpO2: 95% 95%  95%   O2 Device: None (Room air) CPAP/BiPAP  None (Room air)   O2 Liter Flow:       Weight:   107.4 kg (236 lb 12.8 oz)    Height: Vitals:    03/26/24 0731 03/26/24 1415 03/27/24 0500   Weight: 109.8 kg (242 lb) 109.8 kg (242 lb) 107.4 kg (236 lb 12.8 oz)   (-0.2kg loss)     Intake/Output Summary (Last 24 hours) at 03/27/2024 9287  Last data filed at 03/27/2024 9388  Gross per 24 hour   Intake 720 ml   Output 1650 ml   Net -930 ml      PHYSICAL EXAM:  General: NAD, well developed, alert  HEENT: Normocephalic, atraumatic.   Cardiac: Regular rate. No leg edema.  Lungs: Normal WOB on room air at rest  Psychiatric: Normal mood and affect.  CBC     Lab Results   Component Value Date/Time    WBC 6.00 03/26/2024 05:44 AM    RBC 5.33 03/26/2024 05:44 AM    HGB 14.5 03/26/2024 05:44 AM    HCT 44.8 03/26/2024 05:44 AM    MCV 84.1 03/26/2024 05:44 AM    MCH 27.3 03/26/2024 05:44 AM    MCHC 32.4 03/26/2024 05:44 AM    RDW 17.1 (H) 03/26/2024 05:44 AM    PLTCT 220 03/26/2024 05:44 AM    MPV 7.9 03/26/2024 05:44 AM    Lab Results   Component Value Date/Time    NEUT 87.2 (H) 03/24/2024 12:08 AM    ANC 6.10 03/24/2024 12:08 AM    LYMA 6.8 (L) 03/24/2024 12:08 AM    ALC 0.50 (L) 03/24/2024 12:08 AM    MONA 4.7 03/24/2024 12:08 AM    AMC 0.30 03/24/2024 12:08 AM    EOSA 0.3 03/24/2024 12:08 AM    AEC 0.00 03/24/2024 12:08 AM    BASA 1.0 03/24/2024 12:08 AM    ABC 0.10 03/24/2024 12:08 AM          BMP    Lab Results   Component Value Date/Time    NA 141 03/26/2024 05:44 AM    K 4.0 03/26/2024 05:44 AM    CA 9.6 03/26/2024 05:44 AM    CL 105 03/26/2024 05:44 AM    CO2 25 03/26/2024 05:44 AM    GAP 11 03/26/2024 05:44 AM    EGFR1 28 (L) 07/12/2022 10:15 AM    CR 1.84 (H) 03/26/2024 05:44 AM    BUN 32 (H) 03/26/2024 05:44 AM         Latest Reference Range & Units Most Recent   NT-Pro-BNP <125 pg/mL 11,558 (H)  03/24/24 00:08   hs Troponin I 0 Hour <20.0 ng/L 27.5 (H)  03/24/24 00:08   hs Troponin I 2 Hour <20.0 ng/L 30.2 (H)  03/24/24 02:30   (H): Data is abnormally high     PV RENAL ARTERY DUPLEX SCAN  Result Date: 03/26/2024  Abdominal Aorta No evidence of abdominal aortic aneurysm in the visualized segments No significant stenosis in the aorta and visualized arteries  Renal Duplex Normal sized kidneys bilaterally No high grade (>60%) stenosis is seen in the bilateral renal arteries Normal resistive indices bilaterally Patent renal veins bilaterally Bilateral renal cysts  Bilateral renal cysts noted on prior renal ultrasounds and kidney size is also similar to prior.     CHEST SINGLE VIEW  Result Date: 03/26/2024  Single view INDICATION: Pulmonary edema COMPARISON CHEST FILM: 03/24/2024 FINDINGS: Devices: None Heart And Pulmonary Vasculature: The heart size is mildly enlarged with persistent pulmonary vascular congestion. Lungs and Pleura: Mild interstitial edema has nearly resolved. No pleural effusion or pneumothorax.     Mild cardiomegaly with improving findings of CHF.  Finalized by Duwaine Eagles, M.D. on 03/26/2024 8:28 AM. Dictated by Duwaine Eagles, M.D. on 03/26/2024 8:27 AM.    LIMITED ECHO  Result Date: 03/24/2024    The left ventricular systolic function is moderately reduced. The visually estimated ejection fraction is 35%.   The LV is dilated, global hypokinesis.   No intracardiac thrombus.   Grade I (mild) left ventricular diastolic dysfunction. Cannot determine left atrial pressure.   The right ventricular size is normal. The right ventricular systolic function is normal.   This was a 2D echo study only, cardiac valve structures were not fully evaluated.  Compared to the previous study dated 10/04/2023: LVEF =  34%, global hypokinesis, no thrombus was present.     CHEST SINGLE VIEW  Result Date: 03/24/2024  CHEST SINGLE VIEW Clinical history: SHORTNESS OF BREATH. Comparison: 03/11/2024 Findings: Cardiomegaly, with development of venous congestion. Development of bibasilar infiltrates/edema, without consolidation, or pneumothorax. Possible small bilateral pleural effusions. No pneumothorax.     Development of congestive heart failure. Recommend continued progress films.  Finalized by Cathaleen JONETTA Race, MD on 03/24/2024 1:10 AM. Dictated by Cathaleen JONETTA Race, MD on 03/24/2024 1:08 AM.    CHEST 2 VIEWS  Result Date: 03/11/2024  CHEST 2 VIEWS Clinical history: cough Technique: PA and lateral views of the chest. Comparison: Chest x-ray from 07/12/2022. Findings: Heart is again mildly enlarged with otherwise unremarkable mediastinal contours. No significant pulmonary venous congestion/pulmonary edema. No pleural effusion, pneumothorax, or focal consolidation. Multilevel thoracic spondylosis. No obvious destructive osseous lesion or acute bony abnormality appreciated. Postsurgical changes at the left shoulder.     1. No focal lung consolidation. 2. Persistent mild cardiomegaly.  Finalized by Norleen Pane, M.D. on 03/11/2024 6:04 PM. Dictated by Norleen Pane, M.D. on 03/11/2024 6:03 PM.       Meds:  Scheduled Meds:albuterol  0.083% (PROVENTIL ) nebulizer solution 2.5 mg, 2.5 mg, Inhalation, TID & PRN  [Held by Provider] apixaban  (ELIQUIS ) tablet 5 mg, 5 mg, Oral, BID  [START ON 03/28/2024] aspirin  tablet 325 mg, 325 mg, Oral, ONCE  carvediloL  (COREG ) tablet 37.5 mg, 37.5 mg, Oral, BID w/meals  clopiDOGreL  (PLAVIX ) tablet 75 mg, 75 mg, Oral, QDAY  [START ON 03/28/2024] diphenhydrAMINE  HCL (BENADRYL ) capsule 50 mg, 50 mg, Oral, ONCE  [START ON 03/28/2024] famotidine  (PEPCID ) tablet 20 mg, 20 mg, Oral, ONCE  fluticasone  furoate (ARNUITY ELLIPTA ) 100 mcg/actuation inhaler 1 puff, 1 puff, Inhalation, QDAY   And  umeclidinium-vilanteroL (ANORO ELLIPTA ) 62.5-25 mcg/actuation inhaler 1 puff, 1 puff, Inhalation, QDAY  furosemide  (LASIX ) injection 40 mg, 40 mg, Intravenous, QDAY  guaiFENesin  LA (MUCINEX ) tablet 600 mg, 600 mg, Oral, BID  hydrALAZINE  (APRESOLINE ) tablet 25 mg, 25 mg, Oral, TID  insulin  aspart (U-100) (NOVOLOG  FLEXPEN U-100 INSULIN ) injection PEN 0-6 Units, 0-6 Units, Subcutaneous, ACHS (22)  iron  sucrose (VENOFER ) 300 mg in sodium chloride  0.9% 265 mL IVPB, 300 mg, Intravenous, QDAY  isosorbide  dinitrate (ISORDIL ) tablet 10 mg, 10 mg, Oral, TID  nicotine  (NICODERM CQ ) 14 mg/day patch 1 patch, 1 patch, Transdermal, QDAY  polyethylene glycol 3350  (MIRALAX ) packet 17 g, 1 packet, Oral, QDAY  predniSONE  (DELTASONE ) tablet 60 mg, 60 mg, Oral, Q12H  rosuvastatin  (CRESTOR ) tablet 40 mg, 40 mg, Oral, QHS  sacubitriL -valsartan  (ENTRESTO ) 97-103 mg tablet 1 tablet, 1 tablet, Oral, BID  sennosides-docusate sodium  (SENOKOT-S) tablet 1 tablet, 1 tablet, Oral, BID  spironolactone  (ALDACTONE ) tablet 25 mg, 25 mg, Oral, QDAY    Continuous Infusions:   [START ON 03/28/2024] sodium chloride  0.9% infusion       PRN and Respiratory Meds:albuterol -ipratropium Q4H PRN, dextrose  50% PRN, diphenhydrAMINE  HCL Q4H PRN, EPINEPHrine  Q5 MIN PRN, HYDROcodone /acetaminophen  BID PRN, melatonin QHS PRN, ondansetron  Q6H PRN **OR** ondansetron  Q6H PRN

## 2024-03-27 NOTE — Care Plan [600008]
 Problem: Discharge Planning  Goal: Participation in plan of care  Outcome: Goal Ongoing  Goal: Knowledge regarding plan of care  Outcome: Goal Ongoing  Goal: Prepared for discharge  Outcome: Goal Ongoing     Problem: Glucose Management  Goal: Absence of hyperglycemia  Outcome: Goal Ongoing  Goal: Absence of Hypoglycemia  Outcome: Goal Ongoing  Goal: Glucose level within specified parameters  Outcome: Goal Ongoing     Problem: Tobacco Use  Goal: Knowledge of tobacco-use cessation methods  Outcome: Goal Ongoing     Problem: Fluid Volume, Imbalanced  Goal: Absence of fluid overload  Outcome: Goal Ongoing

## 2024-03-27 NOTE — Patient Refusal [[id number]]
 Patient/family declined:  Bed/chair alarm and W/in arms reach during toileting/showering.    Patient/family educated on importance of intervention to their safety/quality of care. Patient/family continues to decline care.    Reason Why Patient/Family declined:  declines bed alarm and assistance and will call for help if needed..    Individualized safety and/or care plan implemented. If additional safety measures implemented, please list them.     Escalated to:  Unit coordinator/charge nurse.

## 2024-03-27 NOTE — Progress Notes [1]
 Heart Failure Nursing Progress Note    Admission Date: 03/23/2024  LOS: 3 days    Admission Weight: 108.9 kg (240 lb)        Most recent weights (inpatient):   Vitals:    03/26/24 0731 03/26/24 1415 03/27/24 0500   Weight: 109.8 kg (242 lb) 109.8 kg (242 lb) 107.4 kg (236 lb 12.8 oz)     Weight change from previous day:-2.4kg    Fluid restriction ordered: 2 L    Intake/Output Summary: (Last 24 hours)    Intake/Output Summary (Last 24 hours) at 03/27/2024 9372  Last data filed at 03/27/2024 9388  Gross per 24 hour   Intake 720 ml   Output 1650 ml   Net -930 ml       Is patient incontinent No    Heart Failure Education provided: Yes     - Medications (including diuretics) reviewed during administration  - Fluid restriction discussed  - Low sodium diet discussed    Anticipated discharge date: 12/6  Discharge goals: breathe better      Daily Assessment of Patient Stated Goals:    Short Term Goal Identified by patient (Short Term=during hospitalization):  Lose water  weight

## 2024-03-28 ENCOUNTER — Encounter: Admit: 2024-03-28 | Discharge: 2024-03-28 | Payer: MEDICARE

## 2024-03-28 LAB — POC GLUCOSE
~~LOC~~ BKR POC GLUCOSE: 123 mg/dL — ABNORMAL HIGH (ref 70–100)
~~LOC~~ BKR POC GLUCOSE: 151 mg/dL — ABNORMAL HIGH (ref 70–100)
~~LOC~~ BKR POC GLUCOSE: 156 mg/dL — ABNORMAL HIGH (ref 70–100)
~~LOC~~ BKR POC GLUCOSE: 233 mg/dL — ABNORMAL HIGH (ref 70–100)

## 2024-03-28 LAB — NT-PRO-BNP: ~~LOC~~ BKR NT-PRO-BNP: 316 pg/mL — ABNORMAL HIGH (ref <125)

## 2024-03-28 MED ORDER — SODIUM CHLORIDE 0.9% IV SOLP
INTRAVENOUS | 0 refills | Status: AC
Start: 2024-03-28 — End: ?

## 2024-03-28 MED ORDER — ASPIRIN 81 MG PO CHEW
81 mg | Freq: Every day | ORAL | 0 refills | Status: DC
Start: 2024-03-28 — End: 2024-03-29
  Administered 2024-03-29: 15:00:00 81 mg via ORAL

## 2024-03-28 MED ORDER — ISOSORBIDE DINITRATE 10 MG PO TAB
20 mg | Freq: Three times a day (TID) | ORAL | 0 refills | Status: DC
Start: 2024-03-28 — End: 2024-03-30
  Administered 2024-03-28 – 2024-03-29 (×3): 20 mg via ORAL

## 2024-03-28 MED ORDER — DIPHENHYDRAMINE HCL 25 MG PO CAP
25 mg | ORAL | 0 refills | Status: DC | PRN
Start: 2024-03-28 — End: 2024-03-30

## 2024-03-28 MED ORDER — HYDRALAZINE 25 MG PO TAB
50 mg | Freq: Three times a day (TID) | ORAL | 0 refills | Status: DC
Start: 2024-03-28 — End: 2024-03-30
  Administered 2024-03-28 – 2024-03-29 (×4): 50 mg via ORAL

## 2024-03-28 MED ORDER — DIPHENHYDRAMINE HCL 50 MG/ML IJ SOLN
25 mg | INTRAVENOUS | 0 refills | Status: DC | PRN
Start: 2024-03-28 — End: 2024-03-30

## 2024-03-28 MED ORDER — ONDANSETRON HCL (PF) 4 MG/2 ML IJ SOLN
4 mg | INTRAVENOUS | 0 refills | Status: AC | PRN
Start: 2024-03-28 — End: ?

## 2024-03-28 NOTE — Progress Notes [1]
 Heart Failure Progress Note    NAME:Hersey Jakorian Marengo             MRN: [id number]                 DOB:09-17-1955          AGE: 68 y.o.  ADMISSION DATE: 03/23/2024             DAYS ADMITTED: LOS: 4 days      Principal Problem:    Acute on chronic clinical systolic heart failure (CMS-HCC)  Active Problems:    CAD (coronary artery disease), native coronary artery    COPD (chronic obstructive pulmonary disease) (CMS-HCC)    SOB (shortness of breath)    Peripheral artery disease (HCC) with repeat stenting left leg    Stage 3b chronic kidney disease (CMS-HCC)    Prostate cancer (CMS-HCC)    Acute respiratory failure with hypoxia (CMS-HCC)    Acute on chronic systolic congestive heart failure (CMS-HCC)      HPI:  Dominico Rod is a 68 y.o. male with a history of combined ICM and NICM, HFrEF (LVEF 35%), CAD with prior LAD stent placement x 2 with bare metal stents in 2007 and 2009, PAD with prior multiple SFA stents (including viabahn stents), hypertension, dyslipidemia, obstructive sleep apnea on CPAP therapy, stage IIIB CKD, gout, renal cysts, vitamin D  deficiency, prostate cancer and COPD/tobacco dependence.     He presented to the ED with worsening shortness of breath on 12/1. He initially began experiencing shortness of breath on 11/18. At that time, he was diagnosed with acute bronchitis and treated with Augmentin . NTproBNP on admission: 11,558. Hs-troponin: 27.5>30.2>25.7. CXR consistent with volume overload. He was hypertensive on arrival, BP 176/99.     PTA cardiac medications: carvedilol  25 mg twice daily, Plavix  75 mg daily, Eliquis  5 mg twice daily, lasix  40 mg daily, Imdur  30 mg daily, rosuvastatin  40 mg daily, Entresto  97-103 mg twice daily and spironolactone  25 mg daily (patient not taking PTA)     Recommendations 03/28/24:  Hold Eliquis  in preparation for RHC and LHC on Friday, 12/5. Contrast allergy pre-treatment ordered.   Clear liquids only at midnight, then NPO at 0900 on 12/5.  Diuresis: Will hold off on diuresis today with bump in creatinine. Will assess volume status with RHC today 12/5.  GDMT for HFrEF: continue Carvedilol  37.5 mg twice daily, Entresto  97-103 mg twice daily, Spironolactone  25 mg daily, Isordil  10 mg TID and Hydralazine  25 mg TID.  Will need to consider LifeVest prior to discharge.   Cardiac rehab ordered 12/3.   CAD/PAD: continue Plavix  75 mg daily and rosuvastatin  40 mg daily.   Iron  repletion ordered per primary team.   Will continue to follow along. Please see Dr. Elenore Czech attestation for additional recommendations.     Ongoing:  BMP once a day. Magnesium  level daily.  Keep Potassium greater than 4.0 and Magnesium  greater than 2.0.  2000mg  sodium dietary restriction.  Fluid Restriction: 2 L  Strict I/O. Goal output: net neg 2 and 3L/24 hour  Daily standing scale weight. Goal Dry Weight: to be determined, previously thought to be 240 lbs     Please place a specimen in lab NT-proBNP order on day of discharge in effort to establish baseline value correlating with suspected euvolemia.      Primary team responsible for placing [facility] Appointment Request order set for Cardiology: Heart Failure appointment request within 24-48 hours of discharge. (Please do not place order  any earlier in effort to reduce possible need for cancellation and rescheduling of visit).      Pt examined and discussed with Dr. Paola Roldan.      Alois LITTIE Galloway, APRN-NP  Available on Voalte  HF Consult Team    Assessment:   Acute on chronic systolic and diastolic HFrEF,  EF: 35%.  Combined ischemic and non-ischemic cardiomyopathy   Major Complications or Comorbidities [facility]): acute/ acute on chronic systolic and/or diastolic heart failure  NYHA functional class III (marked limitation of physical activity - comfortable at rest, but less than ordinary activity causes symptoms of HF e.g., getting dressed or standing from a sitting position),   ACC Stage C (structural heart disease with prior or current symptoms of HF).   He presents with signs of hypervolemia with left  ventricular failure without signs of low flow state.      BNP Labs:   Lab Results   Component Value Date    NTPROBNP 5,694 (H) 03/26/2024    NTPROBNP 11,558 (H) 03/24/2024    NTPROBNP 916 (H) 06/26/2023        Lab Results   Component Value Date    HSTROP0HR 27.5 (H) 03/24/2024    HSTROP2HR 30.2 (H) 03/24/2024    HSTROP4HR 25.7 (H) 03/24/2024    HSTROPDELT4 -4.5 03/24/2024    HSTROPDELTA 2.7 03/24/2024     Admission Weight: 108.9 kg (240 lb)        Most recent weights (inpatient):   Vitals:    03/26/24 1415 03/27/24 0500 03/28/24 0500   Weight: 109.8 kg (242 lb) 107.4 kg (236 lb 12.8 oz) 108 kg (238 lb)        Strict I/O:   net for last 24 hrs: +315 mL  UOP last 24 hrs: 1125 mL  net I/O for entire hospital stay: -3644 mL      Diuretic Therapy    Prior to admission dose    Lasix  40 mg PO daily    Given on admission    Lasix  40 mg IV x 1    Daily Dosing    12/1 Lasix  40 mg IV BID       12/2 Lasix  40 mg PO daily      12/3 Lasix  40 mg PO x 1 in AM, Lasix  40 mg IV x 1 in PM     12/4 Lasix  40 mg IV daily      12/5: Hold diuresis with bump in renal function, re-assess after RHC today       Guideline Directed Medical Therapy PTA Inpatient Changes   ACEI/ARB No, on ARNI    ARNI Entresto  97-103 mg twice daily Continued    BB Carvedilol  25 mg twice daily 12/2 increased to 37.5 mg twice daily   SGLT-2 Inhibitor Previously on Jardiance , unclear why medication was stopped     Mineralocorticoid Receptor Antagonist Previously on spironolactone , unclear why medication was stopped  12/2 spironolactone  25 mg daily    Hydralazine /Nitrate Imdur  30 mg daily 12/2 Imdur  60 mg daily  12/3 Imdur  discontinued in favor of Isordil  10 mg TID, hydralazine  25 mg TID   Ivabradine No    Heart Rhythm Management Therapy No Consider LifeVest this admission   Anticoagulation for LV thrombus Eliquis  5 mg twice daily Continued on admission  12/3 held in preparation for RHC Cardiac Rehab Evaluation for LVEF < or equal to 40% Yes; Date ordered: 03/26/24    7-Day post hospital follow up scheduled within 24-48 hours of discharge  Testing/Procedures   03/24/24 LIMITED TTE    The left ventricular systolic function is moderately reduced. The visually estimated ejection fraction is 35%.    The LV is dilated, global hypokinesis.    No intracardiac thrombus.    Grade I (mild) left ventricular diastolic dysfunction. Cannot determine left atrial pressure.    The right ventricular size is normal. The right ventricular systolic function is normal.    This was a 2D echo study only, cardiac valve structures were not fully evaluated.      Compared to the previous study dated 10/04/2023: LVEF = 34%, global hypokinesis, no thrombus was present.    08/30/23 MPI Stress Test   SUMMARY/OPINION:  This study is abnormal.  There is a moderate size, mild intensity, predominantly fixed of decreased radiotracer uptake in the basal to apical inferior wall segments consistent with an area of myocardial injury.  Global left ventricular systolic function is moderately reduced.  Calculated ejection fraction 29%.  Hypokinesis is slightly more pronounced in the inferior wall segments.  No evidence of transient ischemic dilatation.  The pharmacologic ECG portion of the study is nondiagnostic for ischemia.     Comparison is made with a prior D SPECT study completed 11/15/2021.  Ejection fraction was 35%.  Calculated ejection fraction is less on current study at 29%.  The perfusion pattern is quite similar.     CAD  - s/p PCI LAD (2007, 2009)  - Plavix  75 mg daily and rosuvastatin  20 mg daily   > LHC 12/5    Hx LV thrombus  - PTA Eliquis  5 mg twice as above  - 12/3 limited TTE noted resolution of thrombus. Given low EF, would continue [facility] indefinitely     Hypertension  > Medications as above in GDMT table     PAD   - s/p multiple left SFA stents (2014, 2016)  > Continue Plavix  75 mg daily and rosuvastatin  20 mg daily CKD - Stage 3b  - baseline Cr: 1.6-2.0  - Cr on admission: 1.70  Recent Labs     03/25/24  1443 03/26/24  0544 03/27/24  0602 03/28/24  0537   CR 1.90* 1.84* 1.84* 2.16*   BUN 36* 32* 36* 49*   > Monitor closely with aggressive diuresis     Iron  deficiency  Lab Results   Component Value Date    IRON  32 (L) 03/25/2024    PSAT 10 (L) 03/25/2024    TIBC 323 03/25/2024    FERRITIN 87 03/25/2024   > Venofer  300 mg x 3 doses ordered per primary team (12/2-12/4)    COPD  Tobacco use   - 1-1.5 ppd   > management per primary team     Type 2 diabetes mellitus  Lab Results   Component Value Date/Time    HGBA1C 6.8 (H) 03/24/2024 05:07 AM    HGBA1C 7.4 (H) 06/26/2023 03:32 PM    HGBA1C 7.3 (H) 05/11/2017 02:55 AM    A1C 7.8 (A) 05/04/2022 12:00 AM   > management per primary team      _____________________________________________________________________________    Reason for Consultation:  Evaluation and recommendations re: heart failure     Subjective:  Mr. Trimpe reports he has had worsening shortness of breath on exertion for the last several months with associated chest discomfort and pounding sensation in his chest.     12/5: He currently complains of dyspnea on exertion and chest pain. He tells me his symptoms are usually apparent with exertion, but he  has not moved around very much, so he feels okay right now.  He currently denies orthopnea, lightheadedness, positional lightheadedness, near syncope, abdominal fullness, and peripheral edema.    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)    Generalized headaches    HLD (hyperlipidemia)    Hypertension    Myocardial infarction (CMS-HCC)    OSA (obstructive sleep apnea)    PAD (peripheral artery disease)    Prostate cancer (CMS-HCC)    Seasonal allergic reaction    Stroke (CMS-HCC)    Tobacco abuse     Surgical History:   Procedure Laterality Date    Lt Heart Cath With Ventriculogram Left 12/08/2014    Performed by Erling Locus, MD at [facility] CATH LAB    Coronary Angiography N/A 12/08/2014    Performed by Erling Locus, MD at [facility] CATH LAB    Percutaneous Coronary Intervention N/A 12/08/2014    Performed by Erling Locus, MD at [facility] CATH LAB    PROSTATE BIOPSY  2017    ANGIOGRAPHY CORONARY ARTERY WITH LEFT HEART CATHETERIZATION, REQUEST RADIAL APPROACH N/A 02/14/2017    Performed by Erling Locus, MD at [facility] CATH LAB    POSSIBLE PERCUTANEOUS CORONARY STENT PLACEMENT WITH ANGIOPLASTY N/A 02/14/2017    Performed by Erling Locus, MD at [facility] CATH LAB    ANGIOGRAPHY CORONARY ARTERY WITH LEFT HEART CATHETERIZATION N/A 01/10/2018    Performed by Freeman Donnice NOVAK, MD, FACC at [facility] CATH LAB    POSSIBLE PERCUTANEOUS CORONARY STENT PLACEMENT WITH ANGIOPLASTY N/A 01/10/2018    Performed by Freeman Donnice NOVAK, MD, FACC at [facility] CATH LAB    HX HEART CATHETERIZATION       Family History   Problem Relation Name Age of Onset    Heart Attack Mother      Hypertension Mother      Heart Attack Father      Hypertension Father      Heart Attack Sister      Hypertension Sister      Heart Attack Brother      Hypertension Brother      Diabetes Mother       Social History[1]     Objective:    Allergies: Allergies[2]     Medications:  Scheduled Meds:albuterol  0.083% (PROVENTIL ) nebulizer solution 2.5 mg, 2.5 mg, Inhalation, TID & PRN  [Held by Provider] apixaban  (ELIQUIS ) tablet 5 mg, 5 mg, Oral, BID  aspirin  tablet 325 mg, 325 mg, Oral, ONCE  carvediloL  (COREG ) tablet 37.5 mg, 37.5 mg, Oral, BID w/meals  clopiDOGreL  (PLAVIX ) tablet 75 mg, 75 mg, Oral, QDAY  diphenhydrAMINE  HCL (BENADRYL ) capsule 50 mg, 50 mg, Oral, ONCE  famotidine  (PEPCID ) tablet 20 mg, 20 mg, Oral, ONCE  fluticasone  furoate (ARNUITY ELLIPTA ) 100 mcg/actuation inhaler 1 puff, 1 puff, Inhalation, QDAY   And  umeclidinium-vilanteroL (ANORO ELLIPTA ) 62.5-25 mcg/actuation inhaler 1 puff, 1 puff, Inhalation, QDAY  furosemide  (LASIX ) injection 40 mg, 40 mg, Intravenous, QDAY  guaiFENesin  LA (MUCINEX ) tablet 600 mg, 600 mg, Oral, BID  hydrALAZINE  (APRESOLINE )  tablet 25 mg, 25 mg, Oral, TID  insulin  aspart (U-100) (NOVOLOG  FLEXPEN U-100 INSULIN ) injection PEN 0-6 Units, 0-6 Units, Subcutaneous, ACHS (22)  isosorbide  dinitrate (ISORDIL ) tablet 10 mg, 10 mg, Oral, TID  nicotine  (NICODERM CQ ) 14 mg/day patch 1 patch, 1 patch, Transdermal, QDAY  polyethylene glycol 3350  (MIRALAX ) packet 17 g, 1 packet, Oral, QDAY  predniSONE  (DELTASONE ) tablet 60 mg, 60 mg, Oral, Q12H  rosuvastatin  (CRESTOR ) tablet 40 mg, 40 mg, Oral, QHS  sacubitriL -valsartan  (ENTRESTO ) 97-103 mg tablet 1 tablet, 1 tablet, Oral, BID  sennosides-docusate sodium  (SENOKOT-S) tablet 1 tablet, 1 tablet, Oral, BID  spironolactone  (ALDACTONE ) tablet 25 mg, 25 mg, Oral, QDAY    Continuous Infusions:   sodium chloride  0.9% infusion       PRN and Respiratory Meds:albuterol -ipratropium Q4H PRN, dextrose  50% PRN, diphenhydrAMINE  HCL Q4H PRN, EPINEPHrine  Q5 MIN PRN, HYDROcodone /acetaminophen  BID PRN, melatonin QHS PRN, ondansetron  Q6H PRN **OR** ondansetron  Q6H PRN    Prescriptions Prior to Admission[3]                          Vital Signs:  Last Filed                Vital Signs: 24 Hour Range   BP: 149/81 (12/05 0735)  Temp: 36.3 ?C (97.3 ?F) (12/05 9264)  Pulse: 66 (12/05 0735)  Respirations: 18 PER MINUTE (12/05 0735)  SpO2: 97 % (12/05 0735)  O2%: 21 % (12/04 2106)  O2 Device: None (Room air) (12/05 0735)  BP: (129-149)/(65-88)   Temp:  [36.3 ?C (97.3 ?F)-36.7 ?C (98.1 ?F)]   Pulse:  [66-74]   Respirations:  [16 PER MINUTE-18 PER MINUTE]   SpO2:  [95 %-98 %]   O2%:  [21 %]   O2 Device: None (Room air)    Intensity Pain Scale (Self Report): 6 (03/27/24 2049)      Wt Readings from Last 10 Encounters:   03/28/24 108 kg (238 lb)   03/11/24 111.2 kg (245 lb 2.4 oz)   03/11/24 111.2 kg (245 lb 3.2 oz)   02/13/24 111.9 kg (246 lb 11.2 oz)   01/07/24 110.2 kg (243 lb)   12/12/23 115.6 kg (254 lb 12.8 oz)   10/04/23 111.1 kg (245 lb)   09/27/23 111.1 kg (245 lb)   09/07/23 112.9 kg (249 lb)   08/29/23 112.9 kg (249 lb)       Physical Exam:    General Appearance: no distress  Skin: warm and dry  Neck Veins: JVP 9-10 cm, HJR negative  Auscultation: breathing comfortably, lungs clear to auscultation in BUL, decreased BLL, no rales or rhonchi, no wheezing  Cardiac Auscultation: Regular rhythm, S1, S2, no S3 or S4, murmur  Pedal Pulses: pulses 2+, symmetric  Lower Extremity Edema: no lower extremity swelling  Abdominal Exam: soft, non-tender, bowel sounds normal  Orientation: clear historian, good insight    Laboratory Review:   CBC w/Diff    Lab Results   Component Value Date/Time    WBC 8.30 03/28/2024 05:37 AM    RBC 5.41 03/28/2024 05:37 AM    HGB 14.7 03/28/2024 05:37 AM    HCT 44.9 03/28/2024 05:37 AM    MCV 83.0 03/28/2024 05:37 AM    MCH 27.3 03/28/2024 05:37 AM    MCHC 32.9 03/28/2024 05:37 AM    RDW 16.8 (H) 03/28/2024 05:37 AM    PLTCT 240 03/28/2024 05:37 AM    MPV 8.4 03/28/2024 05:37 AM  Lab Results   Component Value Date/Time    NEUT 87.2 (H) 03/24/2024 12:08 AM    ANC 6.10 03/24/2024 12:08 AM    LYMA 6.8 (L) 03/24/2024 12:08 AM    ALC 0.50 (L) 03/24/2024 12:08 AM    MONA 4.7 03/24/2024 12:08 AM    AMC 0.30 03/24/2024 12:08 AM    EOSA 0.3 03/24/2024 12:08 AM    AEC 0.00 03/24/2024 12:08 AM    BASA 1.0 03/24/2024 12:08 AM    ABC 0.10 03/24/2024 12:08 AM         Chemistry    Lab Results   Component Value Date/Time    NA 138 03/28/2024 05:37 AM    K 4.9 03/28/2024 05:37 AM    CL 102 03/28/2024 05:37 AM    CO2 27 03/28/2024 05:37 AM    GAP 9 03/28/2024 05:37 AM    BUN 49 (H) 03/28/2024 05:37 AM    CR 2.16 (H) 03/28/2024 05:37 AM    GLU 153 (H) 03/28/2024 05:37 AM    MG 2.2 03/28/2024 05:37 AM    BNP 154.0 (H) 08/22/2021 03:30 PM    Lab Results   Component Value Date/Time    CA 9.7 03/28/2024 05:37 AM    PO4 2.7 03/24/2024 05:07 AM    ALBUMIN 4.1 03/28/2024 05:37 AM    TOTPROT 7.3 03/28/2024 05:37 AM    ALKPHOS 101 03/28/2024 05:37 AM    AST 9 03/28/2024 05:37 AM    ALT 6 (L) 03/28/2024 05:37 AM    TOTBILI 0.4 03/28/2024 05:37 AM    GFR 32 (L) 03/28/2024 05:37 AM    GFRAA >60 08/18/2019 02:00 PM            Renal Function    Lab Results   Component Value Date/Time    NA 138 03/28/2024 05:37 AM    K 4.9 03/28/2024 05:37 AM    CL 102 03/28/2024 05:37 AM    CO2 27 03/28/2024 05:37 AM    GAP 9 03/28/2024 05:37 AM    BUN 49 (H) 03/28/2024 05:37 AM    BUN 36 (H) 03/27/2024 06:02 AM    BUN 32 (H) 03/26/2024 05:44 AM    Lab Results   Component Value Date/Time    CR 2.16 (H) 03/28/2024 05:37 AM    CR 1.84 (H) 03/27/2024 06:02 AM    CR 1.84 (H) 03/26/2024 05:44 AM    GLU 153 (H) 03/28/2024 05:37 AM    CA 9.7 03/28/2024 05:37 AM    PO4 2.7 03/24/2024 05:07 AM    ALBUMIN 4.1 03/28/2024 05:37 AM        Lipid Profile INR   Lab Results   Component Value Date    CHOL 82 06/26/2023    TRIG 74 06/26/2023    HDL 30 (L) 06/26/2023    LDL 42 06/26/2023    VLDL 85.1 06/26/2023    NONHDLCHOL 52 06/26/2023         Lab Results   Component Value Date    INR 1.1 05/11/2017          12/1 Chest X-Ray:   Development of congestive heart failure. Recommend continued progress   films.     12/3 Chest X-Ray:  Mild cardiomegaly with improving findings of CHF.      Tele 03/28/24: SR, HR 60-70s    11/30 ECG: SR with 1st degree AVB, HR 83                  [  1]   Social History  Socioeconomic History    Marital status: Widowed    Number of children: 7   Occupational History    Occupation: [employer] DRIVER     Employer: DISABLED   Tobacco Use    Smoking status: Some Days     Current packs/day: 5.00     Average packs/day: 5.0 packs/day for 51.9 years (259.6 ttl pk-yrs)     Types: Cigarettes     Start date: 04/24/1972    Smokeless tobacco: Never    Tobacco comments:     Patient states he had somked up to 5 ppd at his peak      *09/30/2021 5 cigarettes daily    Substance and Sexual Activity    Alcohol use: Not Currently    Drug use: Not Currently     Frequency: 2.0 times per week     Types: Marijuana     Comment: Smokes marijuana 1-2x/week at bedtime.Quit cocaine 1991    Sexual activity: Not Currently     Partners: Female   [2]   Allergies  Allergen Reactions    Contrast Dye Iv, Iodine Containing [Iodinated Contrast Media] HIVES    Isovue-128 [Iopamidol] HIVES     Returned to CICU post cardiac cath with hives on 12/09/2007 @ 1700    Nitroglycerin  HEADACHE     migraines   [3]   Medications Prior to Admission   Medication Sig Dispense Refill Last Dose/Taking    albuterol  0.083% (PROVENTIL ) 2.5 mg /3 mL (0.083 %) nebulizer solution Inhale 3 mL solution by nebulizer as directed every 4 hours. (Patient taking differently: Inhale 3 mL solution by nebulizer as directed every 4 hours as needed.) 180 mL 11 >1 Month    albuterol  sulfate (PROAIR  HFA) 90 mcg/actuation HFA aerosol inhaler Inhale one puff to two puffs by mouth into the lungs every 4 hours as needed for Wheezing (or shortness of breath). 1 g 3 03/23/2024 Morning    betamethasone dipropionate (DIPROSONE) 0.05 % topical ointment USE TO WORST ITCHY AREAS ON BODY TWICE DAILY AS NEEDED   Past Week    budesonide -glycopyr-formoterol  (BREZTRI  AEROSPHERE) 160-9-4.8 mcg/actuation inhaler Inhale two puffs by mouth into the lungs twice daily. 10.7 g 11 03/23/2024 Evening    carvediloL  (COREG ) 25 mg tablet Take one tablet by mouth twice daily with meals.   03/23/2024 at  6:00 PM    cetirizine (ZYRTEC) 10 mg tablet Take one tablet by mouth daily.   03/23/2024 Morning    clopiDOGreL  (PLAVIX ) 75 mg tablet TAKE 1 TABLET BY MOUTH EVERY DAY 90 tablet 3 03/22/2024    DEXCOM G7 SENSOR sensor device Use one each as directed daily.   Taking dulaglutide (TRULICITY) 0.75 mg/0.5 mL injection pen Inject 0.5 mL under the skin every 7 days.   03/17/2024    DUPIXENT PEN 300 mg/2 mL injectable PEN Inject 2 mL under the skin every 7 days.   03/17/2024    ELIQUIS  5 mg tablet TAKE 1 TABLET (5 MG) BY MOUTH TWICE DAILY   03/23/2024 at  6:00 PM    fluticasone  propionate (FLONASE) 50 mcg/actuation nasal spray, suspension Apply one spray to each nostril as directed daily.   03/23/2024 Morning    furosemide  (LASIX ) 20 mg tablet Take two tablets by mouth every morning. Take for lower extremity swelling   03/23/2024 Morning    gabapentin  (NEURONTIN ) 300 mg capsule Take one capsule by mouth three times daily. (Patient taking differently: Take two capsules by mouth twice daily as  needed. Indications: PAIN)   Past Week    HYDROcodone /acetaminophen  (NORCO) 7.5/325 mg tablet Take one tablet by mouth twice daily as needed for Pain. Moderate to severe pain.   Past Week    isosorbide  mononitrate ER (IMDUR ) 30 mg tablet TAKE 1 TABLET BY MOUTH EVERY MORNING (Patient taking differently: Take two tablets by mouth at bedtime daily.) 30 tablet 11 03/23/2024 Evening    mupirocin  (CENTANY ) 2 % topical ointment Apply  topically to affected area three times daily. Apply to the front of the nose as needed up to three times per day 22 g 1 Past Week    Nebulizer Accessories kit Please dispense one nebulizer kit 1 kit 0 Taking    nitroglycerin  (NITROSTAT ) 0.4 mg tablet Place one tab under tongue every 5 mins not to exceed 3 tabs;as needed for chest pain. If chest pain persists go to the ER or call 911. 25 tablet 3     rosuvastatin  (CRESTOR ) 40 mg tablet TAKE 1 TABLET BY MOUTH EVERY DAY (Patient taking differently: Take one tablet by mouth at bedtime daily.) 90 tablet 3 03/22/2024    sacubitriL -valsartan  (ENTRESTO ) 97-103 mg tablet Take one tablet by mouth twice daily. 180 tablet 3 03/23/2024 at  6:00 PM    tamsulosin (FLOMAX) 0.4 mg capsule Take one capsule by mouth daily. (Patient taking differently: Take one capsule by mouth daily as needed.)   Past Month    tiZANidine (ZANAFLEX) 4 mg tablet Take one tablet by mouth every 6-8 hours as needed. NTE 3 doses in 24hrs.   Past Month    traZODone (DESYREL) 100 mg tablet Take two tablets by mouth at bedtime daily. (Patient taking differently: Take two tablets by mouth at bedtime as needed.)   Past Week

## 2024-03-28 NOTE — Patient Refusal [[id number]]
 Patient/family declined:  Bed/chair alarm and W/in arms reach during toileting/showering.    Patient/family educated on importance of intervention to their safety/quality of care. Patient/family continues to decline care.    Reason Why Patient/Family declined:  declines bed alarm and assistance, they are able to move independently. Will call for assistance if needed.    Individualized safety and/or care plan implemented. If additional safety measures implemented, please list them.     Escalated to:  Unit coordinator/charge nurse.

## 2024-03-28 NOTE — Case Mgmt DC Plan [600024]
 Case Management Progress Note    NAME:Sean Henderson                          MRN: [id number]              DOB:15-May-1955          AGE: 68 y.o.  ADMISSION DATE: 03/23/2024             DAYS ADMITTED: LOS: 4 days      Today's Date: 03/28/2024    PLAN: To discharge home when medically stable.     Expected Discharge Date: 03/31/2024   Is Patient Medically Stable: No, Please explain: heart cath postponed due to elevated Cr  Are there Barriers to Discharge? no    INTERVENTION/DISPOSITION:  Discharge Planning              Discharge Planning: Durable Medical Equipment and Supplies  PT/OT recommending home with no needs.   Per Heart Failure team notes, they are considering a LifeVest at discharge. Orders have not been placed. NCM notified Med1 team in huddle daily that this has not been ordered.   Med1 team reports the plan is for heart cath first to determine if Lifevest is needed.  CM will continue to follow and assist in discharge planning as needed.  Transportation              Does the Patient Need Case Management to Arrange Discharge Transport? (ex: facility, ambulance, wheelchair/stretcher, Medicaid, cab, other): No  Will the Patient Use Family Transport?: Yes  Transportation Name, Phone and Availability #1: [name], [name] (Daughter)  [phone]  Support               Reviewed EMR and attended huddle.     Info or Referral                 Positive SDOH Domains and Potential Barriers                   Medication Needs                                  Financial                 Legal                 Other                 Discharge Disposition                                      Selected Continued Care - Admitted Since 03/23/2024    No services have been selected for the patient.         Vernell Sharps, RN, BSN Nurse Case Manager  Available on Voalte

## 2024-03-28 NOTE — Care Plan [600008]
 Problem: Discharge Planning  Goal: Participation in plan of care  Outcome: Goal Ongoing  Goal: Knowledge regarding plan of care  Outcome: Goal Ongoing  Goal: Prepared for discharge  Outcome: Goal Ongoing     Problem: Glucose Management  Goal: Absence of hyperglycemia  Outcome: Goal Ongoing  Goal: Absence of Hypoglycemia  Outcome: Goal Ongoing  Goal: Glucose level within specified parameters  Outcome: Goal Ongoing     Problem: Tobacco Use  Goal: Knowledge of tobacco-use cessation methods  Outcome: Goal Ongoing     Problem: Fluid Volume, Imbalanced  Goal: Absence of fluid overload  Outcome: Goal Ongoing

## 2024-03-28 NOTE — Progress Notes [1]
 Internal Medicine Daily Progress Note     Patient's Name:  Sean Henderson    MRN: [id number]   Today's Date:  03/28/2024  Admission Date: 03/23/2024  LOS: 4 days    Assessment and Plan     Wofford Stratton is a 68 y.o. 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 Symptoms likely exacerbated by recent URI and underlying COPD.    Interval updates - Today 03/28/2024  -RHC and LHC today 12/5, will plan to resume AC post-procedure if not otherwise contraindicated  -Increased Isordil  to 20 mg TID and PTA hydralazine  to 50 mg TID    Flash pulmonary edema in the setting of HFrEF (EF 35%)  Iron  deficiency   Acute hypoxemic resp failure (resolved)  CAD s/p PCI LAD (2007, 2009)  PAD s/p multiple left SFA stents (2014, 2016)  Hx of LV thrombus, on Eliquis , now resolved  -Presented with acute worsening shortness of breath, dyspnea on exertion, and orthopnea. Seen by urgent care on 11/18 for increased SOB and productive cough. Treated with Augmentin , although reports limited improvement in symptoms.  -On arrival, initially required 4 L, quickly weaned to room air.   -BNP 11,000. Trops slightly elevated but non-dynamic. EKG unremarkable.  -CXR on admission notable for pulmonary edema  -Echo (12/1): EF 35%, no intracardiac thrombus.   -Iron  panel: Iron  32, % sat 10, ferritin 87.  -PTA meds: Lasix  40 mg daily, carvedilol  25 mg BID, Entresto  97-103 mg BID, Imdur  60 mg, Eliquis  5 mg BID, Plavix  75 mg. Reportedly no longer taking Jardiance  or spironolactone .  -Clinically improving with IV Lasix . CXR 12/3 with some residual pulmonary edema but notably improved from admission. Weight is at his reported baseline of ~240 lb, down from 243 lb at admission.  PLAN  > HF following  Planning for RHC and LHC today 12/5  Med recs, as below  Needs LifeVest at discharge  Cardiac rehab ordered  Specimen in lab NT-proBNP on day of discharge in effort to establish baseline value   F/u in HF clinic currently scheduled for 12/10. May need to push to later date depending on how long he's here. Next visit with Dr. Dimza on 04/30/24.  > Stop scheduled IV Lasix  40 mg daily. Will reassess tomorrow 12/6 and resume if necessary.   > GDMT:  Spironolactone  25 mg daily  Carvedilol  37.5 mg BID  PTA Entresto   Previously on Jardiance , not currently.  > PTA Imdur  stopped. Increased Isordil  to 20 mg TID and PTA hydralazine  to 50 mg TID.  > Continue PTA Plavix  75 mg daily  > Eliquis  5 mg BID (for LV thrombus) -- currently holding for RHC/LHC. Should continue for 3 additional months from thrombus resolution.  > Continue PTA rosuvastatin  40 mg  > S/p IV iron  sucrose 300 mg x3 doses given iron  deficiency in the setting of HF  > Mag >2, K >4  > 2L fluid restriction. Daily weights.     HTN  -Persistently elevated BP throughout admission despite continuing home meds, increasing carvedilol , and adding spironolactone .   -Obtained renal artery duplex to r/o RAS, which was negative.   -AM cortisol 2.4 on dexamethasone  suppression  test 12/4 (drawn at 6am). DHEAS 57 on 12/3. May be indicative of MACS. Has been previously noted to have mild bilateral adrenal thickening on imaging. Of note, was taking steroids up until hospitalization for acute gout flare.   PLAN  > Continue antihypertensives, as above. May need further up-titration but will likely defer this to outpatient cardiology follow-up.  > Outpatient follow-up for further MACS evaluation (unable to complete this admission due to needing steroids ahead of heart cath)    AKI on CKD 3B  -Cr 1.70 on admit, which appears to be his baseline  -Cr bump to 2.16 on 12/5. Likely from IV diuresis. Appears euvolemic on exam.  PLAN  > Continue monitoring I/Os and daily weights  > Receiving gentle IV fluids prior to heart cath       COPD  Tobacco use  -Active smoker (1-1.5 packs per day)  -Minimal expiratory wheezing on exam. Suspect COPD and active smoking may be contributing to presenting symptoms.   PLAN  > Continue PTA Breztri   > Albuterol  nebs PRN  > Nicotine  patch. Would like rx at discharge.     T2DM  -A1c 6.8 (03/24/24)  -PTA: Trulicity 0.75 mg weekly  Plan  > Low-dose correction factor    Gout  -Recently presented to urgent care 11/24 for acute right elbow pain, concerning for gout flare. Prescribed a short course of steroids. Symptoms now improved.  PLAN  > Reported not taking allopurinol  on admission. Therefore, will defer re-initiation of this to outpatient setting. May need to resume with concurrent course of steroids to help prevent acute gout flare.       FEN: DIET NPO AT MIDNIGHT Sips With Medications  VTE ppx: PTA Eliquis   Last bowel movement: 03/26/24    Code status: Full Code  Disposition: Continue inpatient admission    Seen and discussed with Dr. Otelia DELENA Scurry, MD.    Sharyne Endo, MD  Internal Medicine PGY-1    Subjective   No overnight events. Endorses continued SOB with exertion, as well as with laying flat. Has noticed recurrence of knot in his chest sensation as well, only happens during exertion, resolves quickly, and is less frequent. No other new symptoms or concerns today.     Objective     Vitals:    03/27/24 2045 03/27/24 2106 03/28/24 0500 03/28/24 0530   BP: 137/65   (!) 148/88   BP Source: Arm, Right Upper   Arm, Right Upper   Pulse: 69 74  68   Temp: 36.5 ?C (97.7 ?F)   36.6 ?C (97.8 ?F)   SpO2: 98% 96%  97%   O2 Device: None (Room air) None (Room air)  None (Room air)   O2 Liter Flow:       Weight:   108 kg (238 lb)    Height:          Vitals:    03/26/24 1415 03/27/24 0500 03/28/24 0500   Weight: 109.8 kg (242 lb) 107.4 kg (236 lb 12.8 oz) 108 kg (238 lb)   (-0.2kg loss)     Intake/Output Summary (Last 24 hours) at 03/28/2024 0708  Last data filed at 03/28/2024 0350  Gross per 24 hour   Intake 1440 ml   Output 1125 ml   Net 315 ml      Physical Exam  Constitutional:       General: He is not in acute distress.     Appearance: Normal appearance. He is not toxic-appearing or diaphoretic.  Eyes:      General: No scleral icterus.  Cardiovascular:      Rate and Rhythm: Normal rate and regular rhythm.      Heart sounds: No murmur heard.  Pulmonary:      Effort: Pulmonary effort is normal.      Breath sounds: Normal breath sounds. No wheezing.   Abdominal:      Tenderness: There is no abdominal tenderness. There is no guarding.   Musculoskeletal:      Right lower leg: No edema.      Left lower leg: No edema.   Neurological:      General: No focal deficit present.      Mental Status: He is alert and oriented to person, place, and time.       CBC     Lab Results   Component Value Date/Time    WBC 8.30 03/28/2024 05:37 AM    RBC 5.41 03/28/2024 05:37 AM    HGB 14.7 03/28/2024 05:37 AM    HCT 44.9 03/28/2024 05:37 AM    MCV 83.0 03/28/2024 05:37 AM    MCH 27.3 03/28/2024 05:37 AM    MCHC 32.9 03/28/2024 05:37 AM    RDW 16.8 (H) 03/28/2024 05:37 AM    PLTCT 240 03/28/2024 05:37 AM    MPV 8.4 03/28/2024 05:37 AM    Lab Results   Component Value Date/Time    NEUT 87.2 (H) 03/24/2024 12:08 AM    ANC 6.10 03/24/2024 12:08 AM    LYMA 6.8 (L) 03/24/2024 12:08 AM    ALC 0.50 (L) 03/24/2024 12:08 AM    MONA 4.7 03/24/2024 12:08 AM    AMC 0.30 03/24/2024 12:08 AM    EOSA 0.3 03/24/2024 12:08 AM    AEC 0.00 03/24/2024 12:08 AM    BASA 1.0 03/24/2024 12:08 AM    ABC 0.10 03/24/2024 12:08 AM          BMP    Lab Results   Component Value Date/Time    NA 138 03/28/2024 05:37 AM    K 4.9 03/28/2024 05:37 AM    CA 9.7 03/28/2024 05:37 AM    CL 102 03/28/2024 05:37 AM    CO2 27 03/28/2024 05:37 AM    GAP 9 03/28/2024 05:37 AM    EGFR1 28 (L) 07/12/2022 10:15 AM    CR 2.16 (H) 03/28/2024 05:37 AM    BUN 49 (H) 03/28/2024 05:37 AM         Latest Reference Range & Units Most Recent   NT-Pro-BNP <125 pg/mL 11,558 (H)  03/24/24 00:08   hs Troponin I 0 Hour <20.0 ng/L 27.5 (H)  03/24/24 00:08   hs Troponin I 2 Hour <20.0 ng/L 30.2 (H)  03/24/24 02:30   (H): Data is abnormally high     PV RENAL ARTERY DUPLEX SCAN  Result Date: 03/26/2024  Abdominal Aorta No evidence of abdominal aortic aneurysm in the visualized segments No significant stenosis in the aorta and visualized arteries  Renal Duplex Normal sized kidneys bilaterally No high grade (>60%) stenosis is seen in the bilateral renal arteries Normal resistive indices bilaterally Patent renal veins bilaterally Bilateral renal cysts  Bilateral renal cysts noted on prior renal ultrasounds and kidney size is also similar to prior.     CHEST SINGLE VIEW  Result Date: 03/26/2024  Single view INDICATION: Pulmonary edema COMPARISON CHEST FILM: 03/24/2024 FINDINGS: Devices: None Heart And Pulmonary Vasculature: The heart size is mildly enlarged with persistent pulmonary vascular congestion. Lungs and  Pleura: Mild interstitial edema has nearly resolved. No pleural effusion or pneumothorax.     Mild cardiomegaly with improving findings of CHF.  Finalized by Duwaine Eagles, M.D. on 03/26/2024 8:28 AM. Dictated by Duwaine Eagles, M.D. on 03/26/2024 8:27 AM.    LIMITED ECHO  Result Date: 03/24/2024    The left ventricular systolic function is moderately reduced. The visually estimated ejection fraction is 35%.   The LV is dilated, global hypokinesis.   No intracardiac thrombus.   Grade I (mild) left ventricular diastolic dysfunction. Cannot determine left atrial pressure.   The right ventricular size is normal. The right ventricular systolic function is normal.   This was a 2D echo study only, cardiac valve structures were not fully evaluated.  Compared to the previous study dated 10/04/2023: LVEF = 34%, global hypokinesis, no thrombus was present.     CHEST SINGLE VIEW  Result Date: 03/24/2024  CHEST SINGLE VIEW Clinical history: SHORTNESS OF BREATH. Comparison: 03/11/2024 Findings: Cardiomegaly, with development of venous congestion. Development of bibasilar infiltrates/edema, without consolidation, or pneumothorax. Possible small bilateral pleural effusions. No pneumothorax.     Development of congestive heart failure. Recommend continued progress films.  Finalized by Cathaleen JONETTA Race, MD on 03/24/2024 1:10 AM. Dictated by Cathaleen JONETTA Race, MD on 03/24/2024 1:08 AM.    CHEST 2 VIEWS  Result Date: 03/11/2024  CHEST 2 VIEWS Clinical history: cough Technique: PA and lateral views of the chest. Comparison: Chest x-ray from 07/12/2022. Findings: Heart is again mildly enlarged with otherwise unremarkable mediastinal contours. No significant pulmonary venous congestion/pulmonary edema. No pleural effusion, pneumothorax, or focal consolidation. Multilevel thoracic spondylosis. No obvious destructive osseous lesion or acute bony abnormality appreciated. Postsurgical changes at the left shoulder.     1. No focal lung consolidation. 2. Persistent mild cardiomegaly.  Finalized by Norleen Pane, M.D. on 03/11/2024 6:04 PM. Dictated by Norleen Pane, M.D. on 03/11/2024 6:03 PM.       Meds:  Scheduled Meds:albuterol  0.083% (PROVENTIL ) nebulizer solution 2.5 mg, 2.5 mg, Inhalation, TID & PRN  [Held by Provider] apixaban  (ELIQUIS ) tablet 5 mg, 5 mg, Oral, BID  aspirin  tablet 325 mg, 325 mg, Oral, ONCE  carvediloL  (COREG ) tablet 37.5 mg, 37.5 mg, Oral, BID w/meals  clopiDOGreL  (PLAVIX ) tablet 75 mg, 75 mg, Oral, QDAY  diphenhydrAMINE  HCL (BENADRYL ) capsule 50 mg, 50 mg, Oral, ONCE  famotidine  (PEPCID ) tablet 20 mg, 20 mg, Oral, ONCE  fluticasone  furoate (ARNUITY ELLIPTA ) 100 mcg/actuation inhaler 1 puff, 1 puff, Inhalation, QDAY   And  umeclidinium-vilanteroL (ANORO ELLIPTA ) 62.5-25 mcg/actuation inhaler 1 puff, 1 puff, Inhalation, QDAY  furosemide  (LASIX ) injection 40 mg, 40 mg, Intravenous, QDAY  guaiFENesin  LA (MUCINEX ) tablet 600 mg, 600 mg, Oral, BID  hydrALAZINE  (APRESOLINE ) tablet 25 mg, 25 mg, Oral, TID  insulin  aspart (U-100) (NOVOLOG  FLEXPEN U-100 INSULIN ) injection PEN 0-6 Units, 0-6 Units, Subcutaneous, ACHS (22)  isosorbide  dinitrate (ISORDIL ) tablet 10 mg, 10 mg, Oral, TID  nicotine  (NICODERM CQ ) 14 mg/day patch 1 patch, 1 patch, Transdermal, QDAY  polyethylene glycol 3350  (MIRALAX ) packet 17 g, 1 packet, Oral, QDAY  predniSONE  (DELTASONE ) tablet 60 mg, 60 mg, Oral, Q12H  rosuvastatin  (CRESTOR ) tablet 40 mg, 40 mg, Oral, QHS  sacubitriL -valsartan  (ENTRESTO ) 97-103 mg tablet 1 tablet, 1 tablet, Oral, BID  sennosides-docusate sodium  (SENOKOT-S) tablet 1 tablet, 1 tablet, Oral, BID  spironolactone  (ALDACTONE ) tablet 25 mg, 25 mg, Oral, QDAY    Continuous Infusions:   sodium chloride  0.9% infusion       PRN and Respiratory  Meds:albuterol -ipratropium Q4H PRN, dextrose  50% PRN, diphenhydrAMINE  HCL Q4H PRN, EPINEPHrine  Q5 MIN PRN, HYDROcodone /acetaminophen  BID PRN, melatonin QHS PRN, ondansetron  Q6H PRN **OR** ondansetron  Q6H PRN

## 2024-03-28 NOTE — Progress Notes [1]
 Heart Failure Nursing Progress Note    Admission Date: 03/23/2024  LOS: 4 days    Admission Weight: 108.9 kg (240 lb)        Most recent weights (inpatient):   Vitals:    03/26/24 1415 03/27/24 0500 03/28/24 0500   Weight: 109.8 kg (242 lb) 107.4 kg (236 lb 12.8 oz) 108 kg (238 lb)     Weight change from previous day:+ 0.6kg    Fluid restriction ordered: 2 L    Intake/Output Summary: (Last 24 hours)    Intake/Output Summary (Last 24 hours) at 03/28/2024 9392  Last data filed at 03/28/2024 0350  Gross per 24 hour   Intake 1440 ml   Output 1525 ml   Net -85 ml       Is patient incontinent No    Heart Failure Education provided: Yes     - Medications (including diuretics) reviewed during administration  - Fluid restriction discussed  - Low sodium diet discussed    Anticipated discharge date: NA  Discharge goals: less chest discomfort      Daily Assessment of Patient Stated Goals:    Short Term Goal Identified by patient (Short Term=during hospitalization):  Less SOA

## 2024-03-29 ENCOUNTER — Encounter: Admit: 2024-03-29 | Discharge: 2024-03-29 | Payer: MEDICAID

## 2024-03-29 DIAGNOSIS — Z923 Personal history of irradiation: Secondary | ICD-10-CM

## 2024-03-29 DIAGNOSIS — Z7902 Long term (current) use of antithrombotics/antiplatelets: Secondary | ICD-10-CM

## 2024-03-29 DIAGNOSIS — Z833 Family history of diabetes mellitus: Secondary | ICD-10-CM

## 2024-03-29 DIAGNOSIS — J449 Chronic obstructive pulmonary disease, unspecified: Secondary | ICD-10-CM

## 2024-03-29 DIAGNOSIS — I252 Old myocardial infarction: Secondary | ICD-10-CM

## 2024-03-29 DIAGNOSIS — Z7985 Long-term (current) use of injectable non-insulin antidiabetic drugs: Secondary | ICD-10-CM

## 2024-03-29 DIAGNOSIS — E785 Hyperlipidemia, unspecified: Secondary | ICD-10-CM

## 2024-03-29 DIAGNOSIS — E611 Iron deficiency: Secondary | ICD-10-CM

## 2024-03-29 DIAGNOSIS — Z8546 Personal history of malignant neoplasm of prostate: Secondary | ICD-10-CM

## 2024-03-29 DIAGNOSIS — T82855A Stenosis of coronary artery stent, initial encounter: Secondary | ICD-10-CM

## 2024-03-29 DIAGNOSIS — I161 Hypertensive emergency: Secondary | ICD-10-CM

## 2024-03-29 DIAGNOSIS — I428 Other cardiomyopathies: Secondary | ICD-10-CM

## 2024-03-29 DIAGNOSIS — Z8673 Personal history of transient ischemic attack (TIA), and cerebral infarction without residual deficits: Secondary | ICD-10-CM

## 2024-03-29 DIAGNOSIS — Z7901 Long term (current) use of anticoagulants: Secondary | ICD-10-CM

## 2024-03-29 DIAGNOSIS — J440 Chronic obstructive pulmonary disease with acute lower respiratory infection: Secondary | ICD-10-CM

## 2024-03-29 DIAGNOSIS — Z955 Presence of coronary angioplasty implant and graft: Secondary | ICD-10-CM

## 2024-03-29 DIAGNOSIS — E1122 Type 2 diabetes mellitus with diabetic chronic kidney disease: Secondary | ICD-10-CM

## 2024-03-29 DIAGNOSIS — Z79899 Other long term (current) drug therapy: Secondary | ICD-10-CM

## 2024-03-29 DIAGNOSIS — N1832 Chronic kidney disease, stage 3b (CMS-HCC): Secondary | ICD-10-CM

## 2024-03-29 DIAGNOSIS — Z1152 Encounter for screening for COVID-19: Secondary | ICD-10-CM

## 2024-03-29 DIAGNOSIS — I2510 Atherosclerotic heart disease of native coronary artery without angina pectoris: Secondary | ICD-10-CM

## 2024-03-29 DIAGNOSIS — Z8249 Family history of ischemic heart disease and other diseases of the circulatory system: Secondary | ICD-10-CM

## 2024-03-29 DIAGNOSIS — I130 Hypertensive heart and chronic kidney disease with heart failure and stage 1 through stage 4 chronic kidney disease, or unspecified chronic kidney disease: Principal | ICD-10-CM

## 2024-03-29 DIAGNOSIS — E1151 Type 2 diabetes mellitus with diabetic peripheral angiopathy without gangrene: Secondary | ICD-10-CM

## 2024-03-29 DIAGNOSIS — M109 Gout, unspecified: Secondary | ICD-10-CM

## 2024-03-29 DIAGNOSIS — I255 Ischemic cardiomyopathy: Secondary | ICD-10-CM

## 2024-03-29 DIAGNOSIS — C61 Malignant neoplasm of prostate: Secondary | ICD-10-CM

## 2024-03-29 DIAGNOSIS — F17210 Nicotine dependence, cigarettes, uncomplicated: Secondary | ICD-10-CM

## 2024-03-29 DIAGNOSIS — Z91041 Radiographic dye allergy status: Secondary | ICD-10-CM

## 2024-03-29 LAB — O2HGB SAT-VENOUS POC
~~LOC~~ BKR POC O2HGB SAT-VENOUS: 65 % — ABNORMAL LOW (ref 95–99)
~~LOC~~ BKR POC O2HGB SAT-VENOUS: 69 % — ABNORMAL LOW (ref 95–99)

## 2024-03-29 LAB — POC GLUCOSE
~~LOC~~ BKR POC GLUCOSE: 133 mg/dL — ABNORMAL HIGH (ref 70–100)
~~LOC~~ BKR POC GLUCOSE: 247 mg/dL — ABNORMAL HIGH (ref 70–100)
~~LOC~~ BKR POC GLUCOSE: 249 mg/dL — ABNORMAL HIGH (ref 70–100)

## 2024-03-29 LAB — NT-PRO-BNP: ~~LOC~~ BKR NT-PRO-BNP: 409 pg/mL — ABNORMAL HIGH (ref <125)

## 2024-03-29 LAB — POC ACTIVATED CLOTTING TIME (ISTAT): ~~LOC~~ BKR POCT ACT ISTAT: 209 s

## 2024-03-29 MED ORDER — CLOPIDOGREL 75 MG PO TAB
75 mg | ORAL_TABLET | Freq: Every day | ORAL | 1 refills | 90.00000 days | Status: AC
Start: 2024-03-29 — End: ?
  Filled 2024-04-02: qty 90, 90d supply, fill #0

## 2024-03-29 MED ORDER — ALBUTEROL SULFATE 90 MCG/ACTUATION IN HFAA
1-2 | RESPIRATORY_TRACT | 0 refills | 25.00000 days | Status: AC | PRN
Start: 2024-03-29 — End: ?

## 2024-03-29 MED ORDER — CARVEDILOL 12.5 MG PO TAB
37.5 mg | ORAL_TABLET | Freq: Two times a day (BID) | ORAL | 0 refills | 90.00000 days | Status: AC
Start: 2024-03-29 — End: ?
  Filled 2024-04-02: qty 540, 90d supply, fill #0

## 2024-03-29 MED ORDER — NICOTINE 14 MG/24 HR TD PT24
1 | MEDICATED_PATCH | Freq: Every day | TRANSDERMAL | 0 refills | Status: CN
Start: 2024-03-29 — End: ?

## 2024-03-29 MED ORDER — ALBUTEROL SULFATE 2.5 MG /3 ML (0.083 %) IN NEBU
2.5 mg | RESPIRATORY_TRACT | 0 refills | 25.00000 days | Status: AC | PRN
Start: 2024-03-29 — End: ?
  Filled 2024-04-02: qty 6.7, 17d supply, fill #0
  Filled 2024-04-02: qty 90, 5d supply, fill #0

## 2024-03-29 MED ORDER — ISOSORBIDE DINITRATE 20 MG PO TAB
20 mg | ORAL_TABLET | Freq: Three times a day (TID) | ORAL | 0 refills | 45.00000 days | Status: AC
Start: 2024-03-29 — End: ?
  Filled 2024-04-02: qty 270, 90d supply, fill #0

## 2024-03-29 MED ORDER — HYDRALAZINE 50 MG PO TAB
50 mg | ORAL_TABLET | Freq: Three times a day (TID) | ORAL | 0 refills | 42.50000 days | Status: AC
Start: 2024-03-29 — End: ?
  Filled 2024-04-02: qty 270, 90d supply, fill #0

## 2024-03-29 MED ORDER — ROSUVASTATIN 40 MG PO TAB
40 mg | ORAL_TABLET | Freq: Every evening | ORAL | 1 refills | Status: CN
Start: 2024-03-29 — End: ?

## 2024-03-29 NOTE — Progress Notes [1]
 LIfe vest Rep @ bedside.Education completed. Dc instructions given to pt. Pt verbalizes understanding. Pt transferred to lobby via wheelchair.

## 2024-03-29 NOTE — Care Coordination-Inpatient [600030]
 Brief Note:   Mr. Sean Henderson qualifies for a life-vest.  He has an ejection fraction of 35% or less due to ischemic cardiomyopathy that has not significantly improved with aggressive medical management.  Discussed with Dr. Paola Roldan as well, who believes he would benefit from it.    He will have appointment with EP regarding this in the near future.  Would qualify for ICD.     Future Appointments   Date Time Provider Department Center   04/02/2024  9:00 AM Inga Lamarr L[facility] CVM Exam   04/02/2024  1:10 PM Lendon Pilar, MD [facility]   04/04/2024  8:00 AM CVM SN ECHO 1 SNECHO CVM Procedur   04/04/2024  9:00 AM CVM SN ECHO 1 SNECHO CVM Procedur   04/14/2024  9:30 AM Emert, Gladis SQUIBB, MD [id number] CVM Exam   04/14/2024  1:00 PM Karan Kate SAUNDERS, APRN-NP [id number] CVM Exam   04/30/2024  8:00 AM Dimza, Rosaline SAUNDERS, DO [facility] CVM Exam   09/23/2024  2:30 PM Hajj, Inetta SQUIBB, MD [id number] CVM Exam

## 2024-03-29 NOTE — Care Plan [600008]
 Problem: Discharge Planning  Goal: Participation in plan of care  Outcome: Goal Ongoing  Goal: Knowledge regarding plan of care  Outcome: Goal Ongoing  Goal: Prepared for discharge  Outcome: Goal Ongoing     Problem: Glucose Management  Goal: Absence of hyperglycemia  Outcome: Goal Ongoing  Goal: Absence of Hypoglycemia  Outcome: Goal Ongoing  Goal: Glucose level within specified parameters  Outcome: Goal Ongoing     Problem: Tobacco Use  Goal: Knowledge of tobacco-use cessation methods  Outcome: Goal Ongoing     Problem: Fluid Volume, Imbalanced  Goal: Absence of fluid overload  Outcome: Goal Ongoing

## 2024-03-29 NOTE — Progress Notes [1]
 Internal Medicine Daily Progress Note     Patient's Name:  Sean Henderson    MRN: [id number]   Today's Date:  03/29/2024  Admission Date: 03/23/2024  LOS: 5 days    Assessment and Plan     Sean Henderson is a 68 y.o. 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 Symptoms likely exacerbated by recent URI and underlying COPD.    Interval updates - Today 03/29/2024     >RHC: VCP 8, PDWP 15, CI 2.4.    >LHC: 90% Right PDA stenosis not likely related to worsened EF. Medical management recommended.     >Discharge home with previous apixaban  for LV thrombus (TTE while inpatient with resolution - will need 3 months post resolution), plavix /ASA (CAD+Stents to be followed by cardiology), Increase dose coreg  for HTN + HFrEF, New Hydralazine  + Isordil , as recommended by HF and previous Entresto ). Crestor  40mg . Life Vest requested prior discharge. NT pro BNP spec in lab prior to discharge.     >Will need MACS evaluation as outpatient    ----------------------------------------------------------------------------------------------------------------------------------  Flash pulmonary edema in the setting of HFrEF (EF 35%)  Iron  deficiency   Acute hypoxemic resp failure (resolved)  CAD s/p PCI LAD (2007, 2009)  PAD s/p multiple left SFA stents (2014, 2016)  Hx of LV thrombus, on Eliquis , now resolved  -Presented with acute worsening shortness of breath, dyspnea on exertion, and orthopnea. Seen by urgent care on 11/18 for increased SOB and productive cough. Treated with Augmentin , although reports limited improvement in symptoms.  -On arrival, initially required 4 L, quickly weaned to room air.   -BNP 11,000. Trops slightly elevated but non-dynamic. EKG unremarkable.  -CXR on admission notable for pulmonary edema  -Echo (12/1): EF 35%, no intracardiac thrombus.   -Iron  panel: Iron  32, % sat 10, ferritin 87.  -PTA meds: Lasix  40 mg daily, carvedilol  25 mg BID, Entresto  97-103 mg BID, Imdur  60 mg, Eliquis  5 mg BID, Plavix  75 mg. Reportedly no longer taking Jardiance  or spironolactone .  -Clinically improving with IV Lasix . CXR 12/3 with some residual pulmonary edema but notably improved from admission. Weight is at his reported baseline of ~240 lb, down from 243 lb at admission.    PLAN  > HF following  Planning for RHC and LHC today 12/5  Med recs, as below  Needs LifeVest at discharge  Cardiac rehab ordered  Specimen in lab NT-proBNP on day of discharge in effort to establish baseline value   F/u in HF clinic currently scheduled for 12/10. May need to push to later date depending on how long he's here. Next visit with Dr. Dimza on 04/30/24.  > Stop scheduled IV Lasix  40 mg daily. Will reassess tomorrow 12/6 and resume if necessary.   > GDMT:  Spironolactone  25 mg daily (HELD)  Carvedilol  37.5 mg BID  PTA Entresto   Previously on Jardiance , not currently.  > PTA Imdur  stopped. Continue Isordil  to 20 mg TID and PTA hydralazine  to 50 mg TID.  > Continue PTA Plavix  75 mg daily  > Eliquis  5 mg BID (for LV thrombus) -. Should continue for 3 additional months from thrombus resolution.  > Continue PTA  rosuvastatin  40 mg  > S/p IV iron  sucrose 300 mg x3 doses given iron  deficiency in the setting of HF  > Mag >2, K >4  > 2L fluid restriction. Daily weights.     HTN  -Persistently elevated BP throughout admission despite continuing home meds, increasing carvedilol , and adding spironolactone .   -Obtained renal artery duplex to r/o RAS, which was negative.   -AM cortisol 2.4 on dexamethasone  suppression test 12/4 (drawn at 6am). DHEAS 57 on 12/3. May be indicative of MACS. Has been previously noted to have mild bilateral adrenal thickening on imaging. Of note, was taking steroids up until hospitalization for acute gout flare.     PLAN  > Continue antihypertensives, as above. May need further up-titration but will likely defer this to outpatient cardiology follow-up.  > Outpatient follow-up for further MACS evaluation (unable to complete this admission due to needing steroids ahead of heart cath)    AKI on CKD 3B  -Cr 1.70 on admit, which appears to be his baseline  -Cr bump to 2.16 on 12/5. Likely from IV diuresis. Appears euvolemic on exam.  PLAN  > Continue monitoring I/Os and daily weights  > Receiving gentle IV fluids prior to heart cath       COPD  Tobacco use  -Active smoker (1-1.5 packs per day)  -Minimal expiratory wheezing on exam. Suspect COPD and active smoking may be contributing to presenting symptoms.   PLAN  > Continue PTA Breztri   > Albuterol  nebs PRN  > Nicotine  patch. Would like rx at discharge.     T2DM  -A1c 6.8 (03/24/24)  -PTA: Trulicity 0.75 mg weekly  Plan  > Low-dose correction factor    Gout  -Recently presented to urgent care 11/24 for acute right elbow pain, concerning for gout flare. Prescribed a short course of steroids. Symptoms now improved.  PLAN  > Reported not taking allopurinol  on admission. Therefore, will defer re-initiation of this to outpatient setting. May need to resume with concurrent course of steroids to help prevent acute gout flare.       FEN: Advance Diet as Tolerated  DIET CARDIAC(LOW FAT/LOW SODIUM)  VTE ppx: PTA Eliquis   Last bowel movement: 03/27/24    Code status: Full Code  Disposition: Continue inpatient admission    Seen and discussed with Dr. Otelia DELENA Scurry, MD.    Jayson Suzie Greenhouse MD   Internal Medicine Resident  Available at [facility]  Pager: [phone]      Subjective   Seen at bedside  Extensive discussion about his hospital stay including cardiac imaging, cardiac catheterization results, diagnosis, management, medication changes, life vest importance, and future follow ups. Patient showed understanding and agreed on the plan. Feeling otherwise well. Chest tightness and dyspnea is improved compared to admission    Objective     Vitals:    03/29/24 0014 03/29/24 0100 03/29/24 0200 03/29/24 0400   BP: 125/55 (!) 141/61 132/65 138/69   BP Source: Arm, Left Upper Arm, Left Upper Arm, Left Upper Arm, Left Upper   Pulse: 73 71  68   Temp: 36.7 ?C (98 ?F)  36.6 ?C (97.9 ?F) 36.4 ?C (97.5 ?F)   SpO2: 97% 97% 99% 99%   O2 Device: None (Room air) None (Room air) None (Room air) None (Room air)   O2 Liter Flow:       Weight:       Height:          Vitals:    03/26/24 1415 03/27/24  0500 03/28/24 0500   Weight: 109.8 kg (242 lb) 107.4 kg (236 lb 12.8 oz) 108 kg (238 lb)   (-0.2kg loss)     Intake/Output Summary (Last 24 hours) at 03/29/2024 9374  Last data filed at 03/29/2024 0400  Gross per 24 hour   Intake 522 ml   Output 650 ml   Net -128 ml      Physical Exam  Constitutional:       General: He is not in acute distress.     Appearance: Normal appearance. He is not toxic-appearing or diaphoretic.   Eyes:      General: No scleral icterus.  Cardiovascular:      Rate and Rhythm: Normal rate and regular rhythm.      Heart sounds: No murmur heard.  Pulmonary:      Effort: Pulmonary effort is normal.      Breath sounds: Normal breath sounds. No wheezing.   Abdominal:      Tenderness: There is no abdominal tenderness. There is no guarding.   Musculoskeletal:      Right lower leg: No edema.      Left lower leg: No edema.   Neurological:      General: No focal deficit present.      Mental Status: He is alert and oriented to person, place, and time.       CBC     Lab Results   Component Value Date/Time    WBC 8.30 03/28/2024 05:37 AM    RBC 5.41 03/28/2024 05:37 AM    HGB 14.7 03/28/2024 05:37 AM    HCT 44.9 03/28/2024 05:37 AM    MCV 83.0 03/28/2024 05:37 AM    MCH 27.3 03/28/2024 05:37 AM    MCHC 32.9 03/28/2024 05:37 AM    RDW 16.8 (H) 03/28/2024 05:37 AM    PLTCT 240 03/28/2024 05:37 AM    MPV 8.4 03/28/2024 05:37 AM    Lab Results   Component Value Date/Time    NEUT 87.2 (H) 03/24/2024 12:08 AM    ANC 6.10 03/24/2024 12:08 AM    LYMA 6.8 (L) 03/24/2024 12:08 AM    ALC 0.50 (L) 03/24/2024 12:08 AM    MONA 4.7 03/24/2024 12:08 AM    AMC 0.30 03/24/2024 12:08 AM    EOSA 0.3 03/24/2024 12:08 AM    AEC 0.00 03/24/2024 12:08 AM    BASA 1.0 03/24/2024 12:08 AM    ABC 0.10 03/24/2024 12:08 AM          BMP    Lab Results   Component Value Date/Time    NA 138 03/28/2024 05:37 AM    K 4.9 03/28/2024 05:37 AM    CA 9.7 03/28/2024 05:37 AM    CL 102 03/28/2024 05:37 AM    CO2 27 03/28/2024 05:37 AM    GAP 9 03/28/2024 05:37 AM    EGFR1 28 (L) 07/12/2022 10:15 AM    CR 2.16 (H) 03/28/2024 05:37 AM    BUN 49 (H) 03/28/2024 05:37 AM         Latest Reference Range & Units Most Recent   NT-Pro-BNP <125 pg/mL 11,558 (H)  03/24/24 00:08   hs Troponin I 0 Hour <20.0 ng/L 27.5 (H)  03/24/24 00:08   hs Troponin I 2 Hour <20.0 ng/L 30.2 (H)  03/24/24 02:30   (H): Data is abnormally high     PV RENAL ARTERY DUPLEX SCAN  Result Date: 03/26/2024  Abdominal Aorta No evidence of abdominal aortic aneurysm in  the visualized segments No significant stenosis in the aorta and visualized arteries  Renal Duplex Normal sized kidneys bilaterally No high grade (>60%) stenosis is seen in the bilateral renal arteries Normal resistive indices bilaterally Patent renal veins bilaterally Bilateral renal cysts  Bilateral renal cysts noted on prior renal ultrasounds and kidney size is also similar to prior.     CHEST SINGLE VIEW  Result Date: 03/26/2024  Single view INDICATION: Pulmonary edema COMPARISON CHEST FILM: 03/24/2024 FINDINGS: Devices: None Heart And Pulmonary Vasculature: The heart size is mildly enlarged with persistent pulmonary vascular congestion. Lungs and Pleura: Mild interstitial edema has nearly resolved. No pleural effusion or pneumothorax.     Mild cardiomegaly with improving findings of CHF.  Finalized by Duwaine Eagles, M.D. on 03/26/2024 8:28 AM. Dictated by Duwaine Eagles, M.D. on 03/26/2024 8:27 AM.    LIMITED ECHO  Result Date: 03/24/2024    The left ventricular systolic function is moderately reduced. The visually estimated ejection fraction is 35%.   The LV is dilated, global hypokinesis.   No intracardiac thrombus.   Grade I (mild) left ventricular diastolic dysfunction. Cannot determine left atrial pressure.   The right ventricular size is normal. The right ventricular systolic function is normal.   This was a 2D echo study only, cardiac valve structures were not fully evaluated.  Compared to the previous study dated 10/04/2023: LVEF = 34%, global hypokinesis, no thrombus was present.     CHEST SINGLE VIEW  Result Date: 03/24/2024  CHEST SINGLE VIEW Clinical history: SHORTNESS OF BREATH. Comparison: 03/11/2024 Findings: Cardiomegaly, with development of venous congestion. Development of bibasilar infiltrates/edema, without consolidation, or pneumothorax. Possible small bilateral pleural effusions. No pneumothorax.     Development of congestive heart failure. Recommend continued progress films.  Finalized by Cathaleen JONETTA Race, MD on 03/24/2024 1:10 AM. Dictated by Cathaleen JONETTA Race, MD on 03/24/2024 1:08 AM.    CHEST 2 VIEWS  Result Date: 03/11/2024  CHEST 2 VIEWS Clinical history: cough Technique: PA and lateral views of the chest. Comparison: Chest x-ray from 07/12/2022. Findings: Heart is again mildly enlarged with otherwise unremarkable mediastinal contours. No significant pulmonary venous congestion/pulmonary edema. No pleural effusion, pneumothorax, or focal consolidation. Multilevel thoracic spondylosis. No obvious destructive osseous lesion or acute bony abnormality appreciated. Postsurgical changes at the left shoulder.     1. No focal lung consolidation. 2. Persistent mild cardiomegaly.  Finalized by Norleen Pane, M.D. on 03/11/2024 6:04 PM. Dictated by Norleen Pane, M.D. on 03/11/2024 6:03 PM. Meds:  Scheduled Meds:albuterol  0.083% (PROVENTIL ) nebulizer solution 2.5 mg, 2.5 mg, Inhalation, TID & PRN  [Held by Provider] apixaban  (ELIQUIS ) tablet 5 mg, 5 mg, Oral, BID  aspirin  chewable tablet 81 mg, 81 mg, Oral, QDAY  carvediloL  (COREG ) tablet 37.5 mg, 37.5 mg, Oral, BID w/meals  clopiDOGreL  (PLAVIX ) tablet 75 mg, 75 mg, Oral, QDAY  fluticasone  furoate (ARNUITY ELLIPTA ) 100 mcg/actuation inhaler 1 puff, 1 puff, Inhalation, QDAY   And  umeclidinium-vilanteroL (ANORO ELLIPTA ) 62.5-25 mcg/actuation inhaler 1 puff, 1 puff, Inhalation, QDAY  guaiFENesin  LA (MUCINEX ) tablet 600 mg, 600 mg, Oral, BID  hydrALAZINE  (APRESOLINE ) tablet 50 mg, 50 mg, Oral, TID  insulin  aspart (U-100) (NOVOLOG  FLEXPEN U-100 INSULIN ) injection PEN 0-6 Units, 0-6 Units, Subcutaneous, ACHS (22)  isosorbide  dinitrate (ISORDIL ) tablet 20 mg, 20 mg, Oral, TID  nicotine  (NICODERM CQ ) 14 mg/day patch 1 patch, 1 patch, Transdermal, QDAY  polyethylene glycol 3350  (MIRALAX ) packet 17 g, 1 packet, Oral, QDAY  rosuvastatin  (CRESTOR ) tablet 40 mg, 40 mg, Oral,  QHS  sacubitriL -valsartan  (ENTRESTO ) 97-103 mg tablet 1 tablet, 1 tablet, Oral, BID  sennosides-docusate sodium  (SENOKOT-S) tablet 1 tablet, 1 tablet, Oral, BID  [Held by Provider] spironolactone  (ALDACTONE ) tablet 25 mg, 25 mg, Oral, QDAY    Continuous Infusions:      PRN and Respiratory Meds:albuterol -ipratropium Q4H PRN, dextrose  50% PRN, diphenhydrAMINE  HCL Q4H PRN **OR** diphenhydrAMINE  HCL Q4H PRN, HYDROcodone /acetaminophen  BID PRN, melatonin QHS PRN, ondansetron  Q6H PRN **OR** ondansetron  Q6H PRN

## 2024-03-29 NOTE — Patient Refusal [[id number]]
 Patient/family declined:  Bed/chair alarm.    Patient/family educated on importance of intervention to their safety/quality of care. Patient/family continues to decline care.    Reason Why Patient/Family declined:  Declines bed alarm because he wants to sit on the edge of the bed so he can pee in the urinal. Patient will call if he needs to get up.    Individualized safety and/or care plan implemented. If additional safety measures implemented, please list them.     Escalated to:  Facilities manager.

## 2024-03-29 NOTE — Progress Notes [1]
 Heart Failure Nursing Progress Note    Admission Date: 03/23/2024  LOS: 5 days    Admission Weight: 108.9 kg (240 lb)        Most recent weights (inpatient):   Vitals:    03/27/24 0500 03/28/24 0500 03/29/24 0600   Weight: 107.4 kg (236 lb 12.8 oz) 108 kg (238 lb) 108.3 kg (238 lb 12.8 oz)     Weight change from previous day:0    Fluid restriction ordered: 2 L    Intake/Output Summary: (Last 24 hours)    Intake/Output Summary (Last 24 hours) at 03/29/2024 0645  Last data filed at 03/29/2024 0400  Gross per 24 hour   Intake 522 ml   Output 650 ml   Net -128 ml       Is patient incontinent No    Heart Failure Education provided: Yes     - Medications (including diuretics) reviewed during administration  - Fluid restriction discussed  - Low sodium diet discussed    Anticipated discharge date: 12/6  Discharge goals: Feel better      Daily Assessment of Patient Stated Goals:    Short Term Goal Identified by patient (Short Term=during hospitalization):  Less SOA

## 2024-03-31 ENCOUNTER — Encounter: Admit: 2024-03-31 | Discharge: 2024-03-31 | Payer: MEDICAID

## 2024-03-31 NOTE — Telephone Encounter [36]
 Patient Discharge Date from hospital: 03/29/24  Date Call Attempted: 03/31/24  Number of Attempts: 1  Date Call Completed:      Call placed to pt for 72 hour post hospital follow up call. No answer, VM full. MyChart message sent.     Two Patient Identifier complete: Yes []     Next Appointment    Next follow-up appointment is on 04/02/2024 at 0900 with Mallie Pon, APRN at our [facility] clinic.    Transportation    Does pt have transportation?  Yes []     No []    NA []      Home Health    No   [facility] [phone] Fax) [phone]     Medications    Does pt have all medications? Yes  []     No []     Does patient have any questions regarding medications? Yes []   No []       START taking:  hydrALAZINE  (APRESOLINE )  isosorbide  dinitrate (ISORDIL  TITRADOSE)  nicotine  (NICODERM CQ )  Start taking on: March 30, 2024  CHANGE how you take:  carvediloL  (COREG )  STOP taking:  isosorbide  mononitrate ER 30 mg tablet,extended  release 24 hr (IMDUR )  nitroglycerin  0.4 mg tablet (NITROSTAT )    Diet    200 mg cholesterol, 2 G Na, 2 L fluid restriction     Is patient following prescribed diet and restrictions?  Yes []    No []      If you have questions regarding your diet at home, you may contact a dietitian at [phone].    Scale/Weight    Does pt have a scale at home?  Yes []    No []     Did pt weight first thing this morning?  Yes []    No []      If yes, what was pt's first morning weight today?      Signs and Symptoms    Pt reports the following symptoms:         Does patient believe they received adequate education during admission for their diagnosis of heart failure? Yes []   No []   NA []     Was pt given zone sheet? Yes []   No [x]     Comments:     Is patient able to read back weights or symptoms of heart failure and when to notify the provider? Yes []   No []   NA []     Autonomously? Yes  []   With cueing?  Yes     []   Unable or declined?  []          Has patient verbalized understanding the importance to review and utilize the heart failure zone tool provided? Yes  []  No []   NA  []       Intervention(s)          Plan of Care    Continued education needed for heart failure symptom management and when to contact our office.

## 2024-03-31 NOTE — Discharge Summary [5]
 Discharge Summary      Name: Sean Henderson  Medical Record Number: [id number]        Account Number:  [id number]  Date Of Birth:  Jul 18, 1955                         Age:  68 y.o.  Admit date:  03/23/2024                     Discharge date: 03/29/24      Discharge Attending: Otelia Scurry, MD  Discharge Summary Completed By: Sharyne Endo, MD    Service: Med 1- 2042    Reason for hospitalization:  Acute on chronic clinical systolic heart failure (CMS-HCC) [I50.23]    Primary Discharge Diagnosis:   Acute on chronic clinical systolic heart failure (CMS-HCC)    Hospital Diagnoses:  Hospital Problems        Active Problems    * (Principal) Acute on chronic clinical systolic heart failure (CMS-HCC)    CAD (coronary artery disease), native coronary artery    COPD (chronic obstructive pulmonary disease) (CMS-HCC)    SOB (shortness of breath)    Peripheral artery disease (HCC) with repeat stenting left leg    Stage 3b chronic kidney disease (CMS-HCC)    Prostate cancer (CMS-HCC)    Acute respiratory failure with hypoxia (CMS-HCC)    Acute on chronic systolic congestive heart failure (CMS-HCC)     Present on Admission:   CAD (coronary artery disease), native coronary artery   COPD (chronic obstructive pulmonary disease) (CMS-HCC)   Peripheral artery disease (HCC) with repeat stenting left leg   Prostate cancer (CMS-HCC)   SOB (shortness of breath)   Stage 3b chronic kidney disease (CMS-HCC)   Acute on chronic clinical systolic heart failure (CMS-HCC)    Significant Past Medical History        Arthritis  CAD (coronary artery disease), native coronary artery      Comment:  A.2/04 Normal coronaries [facility] EF 15%  B. 2/07- Chest                pain, admit [place]: Bare metal stent to 90% mLAD, EF 45% C.               12/09/07 Angina, [facility]: BareMetalStent for 80% mLAD,                Kissing balloon 80% Dx2  D. 06/17/08- dobutamine stress                echo:  LV 5.2. EF 60%. No ischemia.  E. 08/25/09-                Dobutamine stress echo: Mild concentric LVH. EF 60%. LA                is mildly enlarged. No ischemia. F.  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 No surgery nor                chemo  Seasonal allergic reaction  Stroke (CMS-HCC)  Tobacco abuse    Allergies   Contrast dye iv, iodine containing [iodinated contrast media]; Isovue-128 [iopamidol]; and Nitroglycerin     Brief Hospital Course     Sean Henderson is a 68 y.o. 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     He presented with several days of worsening dyspnea and orthopnea, with initial hypoxemia requiring 4L O2, although was quickly weaned to room air. Admission labs showed markedly elevated NT-proBNP (11,558), mildly elevated but non-dynamic troponins (120s), and chest x-ray consistent with pulmonary edema. Echocardiogram revealed EF 35%, global hypokinesis, LV dilation (largely unchanged from prior in June 2025), and no intracardiac thrombus. He did not appear overtly volume-overloaded on exam, weight was approximately at his baseline, and had normal CVP on echo. Therefore, suspect presenting symptoms were secondary to flash pulmonary edema, which improved with IV diuresis.     Despite improvement from admission, he continued to have some dyspnea on exertion and later reported the sensation of a knot in his chest with exertion, concerning for ischemia. Therefore, cardiology was consulted, who recommended RHC and LHC.  Left heart cath revealed moderate diffuse in-stent restenosis in the LAD (max 50%), 60% ostial OM1 stenosis, and 90% stenosis of the right PDA. In the absence of overt angina, felt appropriate to continue medical management for now. Right heart cath revealed elevated MAP with mildly elevated filling pressures. He was continued on PTA PO Lasix  40 mg daily. Guideline-directed medical therapy was optimized, including increasing carvedilol  to 37.5 mg BID, increasing hydralazine  to 50 mg TID, and stopping Imdur  and starting Isordil  20 mg TID. Of note, spironolactone  was held at discharge, as patient was not taking PTA. He was continued on DAPT and Eliquis . Given resolution of LV thrombus on echo, he should continue Eliquis  for another 3 months post-resolution. In addition, LifeVest and cardiac rehab were both requested prior to discharge. He will follow up with heart failure for further management, as well as EP for ICD evaluation.    Of note, he had persistently elevated blood pressures throughout admission despite antihypertensive therapy, including up-titration of carvedilol  and addition of hydralazine  and isordil . Renal artery duplex was negative for stenosis. Appears prior hyperaldo workup was negative as well. Completed dexamethasone  suppression test while inpatient, which was borderline with cortisol of 2.4 (although was drawn at 6 am). He should repeat testing as an outpatient to complete evaluation for possible MACS given resistant hypertension and findings of bilateral adrenal thickening on imaging.       Day of discharge exam notable for:   Constitutional:       General: He is not in acute distress.     Appearance: Normal appearance. He is not toxic-appearing or diaphoretic.   Eyes:      General: No scleral icterus.  Cardiovascular:      Rate and Rhythm: Normal rate and regular rhythm.  Heart sounds: No murmur heard.  Pulmonary:      Effort: Pulmonary effort is normal.      Breath sounds: Normal breath sounds. No wheezing.   Abdominal:      Tenderness: There is no abdominal tenderness. There is no guarding.   Musculoskeletal:      Right lower leg: No edema.      Left lower leg: No edema.   Neurological:      General: No focal deficit present.      Mental Status: He is alert and oriented to person, place, and time.     Items Needing Follow Up   Pending items or areas that need to be addressed at follow up:    -Repeat dexamethasone  suppression test as outpatient to complete MACS evaluation   -Okay to stop Eliquis  in 3 months now with resolved cardiac thrombus (although would defer to outpatient cardiology)   -Outpatient f/u with HF and EP   -Ensure he receives LifeVest    Pending Labs and Follow Up Radiology    Pending labs and/or radiology review at this time of discharge are listed below: Please note- any labs with collected status will not have a result; if this area is blank, there are no items for review.         Medications        Medication List        START taking these medications      hydrALAZINE  50 mg tablet  Commonly known as: APRESOLINE   Dose: 50 mg  Take one tablet by mouth three times daily. Indications: chronic heart failure  For: chronic heart failure  Quantity: 270 tablet  Refills: 0     isosorbide  dinitrate 20 mg tablet  Commonly known as: ISORDIL  TITRADOSE  Dose: 20 mg  Take one tablet by mouth three times daily. Indications: chronic heart failure  For: chronic heart failure  Quantity: 270 tablet  Refills: 0     nicotine  14 mg/day patch  Commonly known as: NICODERM CQ   Dose: 1 patch  Apply one patch to top of skin as directed daily. Rotate patch location.  Indications: stop smoking  For: stop smoking  Quantity: 28 patch  Refills: 0            CHANGE how you take these medications      carvediloL  12.5 mg tablet  Commonly known as: COREG   Dose: 37.5 mg  Take three tablets by mouth twice daily with meals. Take with food.  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 Continue taking this medication, and follow the directions you see here.            CONTINUE taking these medications      * albuterol  sulfate 90 mcg/actuation HFA aerosol inhaler  Commonly known as: PROAIR  HFA  Dose: 1-2 puff  Doctor's comments: Changed qty to match pkg size - 12.06.25 1220 [address]  Inhale one puff to two puffs by mouth into the lungs every 4 hours as needed for Wheezing (or shortness of breath). Indications: asthma attack  For: asthma attack  Quantity: 6.7 g  Refills: 0     * albuterol  0.083% 2.5 mg /3 mL (0.083 %) nebulizer solution  Commonly known as: PROVENTIL   Dose: 2.5 mg  Doctor's comments: changed qty to match pkg size - 12.06.25 1222 [address]  Inhale 3 mL solution by nebulizer as directed every 4 hours as needed. Indications: asthma attack  For: asthma attack  Quantity: 90 mL  Refills: 0     betamethasone dipropionate 0.05 % topical ointment  Commonly known as: DIPROSONE  USE TO WORST ITCHY AREAS ON BODY TWICE DAILY AS NEEDED  Refills: 0     budesonide -glycopyr-formoterol  160-9-4.8 mcg/actuation inhaler  Commonly known as: BREZTRI  AEROSPHERE  Dose: 2 puff  Inhale two puffs by mouth into the lungs twice daily.  Quantity: 10.7 g  Refills: 11     cetirizine 10 mg tablet  Commonly known as: ZyrTEC  Dose: 10 mg  Take one tablet by mouth daily.  Refills: 0     clopiDOGreL  75 mg tablet  Commonly known as: PLAVIX   Dose: 75 mg  Take one tablet by mouth daily. Indications: a sudden worsening of angina called acute coronary syndrome  For: a sudden worsening of angina called acute coronary syndrome  Quantity: 90 tablet  Refills: 1     DEXCOM G7 SENSOR sensor device  Generic drug: blood-glucose sensor  Dose: 1 each  Use one each as directed daily.  Refills: 0     dulaglutide 0.75 mg/0.5 mL injection pen  Commonly known as: TRULICITY  Dose: 0.75 mg  Inject 0.5 mL under the skin every 7 days.  Refills: 0     DUPIXENT PEN 300 mg/2 mL injectable PEN  Generic drug: dupilumab  Dose: 300 mg  Inject 2 mL under the skin every 7 days.  Refills: 0     ELIQUIS  5 mg tablet  Generic drug: apixaban   TAKE 1 TABLET (5 MG) BY MOUTH TWICE DAILY  Refills: 0     ENTRESTO  97-103 mg tablet  Generic drug: sacubitriL -valsartan   Dose: 1 tablet  Take one tablet by mouth twice daily.  Quantity: 180 tablet  Refills: 3     fluticasone  propionate 50 mcg/actuation nasal spray, suspension  Commonly known as: FLONASE  Dose: 1 spray  Apply one spray to each nostril as directed daily.  Refills: 0     furosemide  20 mg tablet  Commonly known as: LASIX   Dose: 40 mg  Take two tablets by mouth every morning. Take for lower extremity swelling  Refills: 0     gabapentin  300 mg capsule  Commonly known as: NEURONTIN   Dose: 600 mg  Take one capsule by mouth three times daily.  Refills: 0     HYDROcodone /acetaminophen  7.5/325 mg tablet  Commonly known as: NORCO  Dose: 1 tablet  Take one tablet by mouth twice daily as needed for Pain. Moderate to severe pain.  Refills: 0     mupirocin  2 % topical ointment  Commonly known as: CENTANY   Apply  topically to affected area three times daily. Apply to the front of the nose as needed up to three times per day  Quantity: 22 g  Refills: 1     nebulizer accessories KIT  Please dispense one nebulizer kit  Quantity: 1 kit  Refills: 0     rosuvastatin  40 mg tablet  Commonly known as: CRESTOR   Dose: 40 mg  Take one tablet by mouth at bedtime daily. Indications: excessive fat in the blood  For: excessive fat in the blood  Quantity: 90 tablet  Refills: 1     tamsulosin 0.4 mg capsule  Commonly known as: FLOMAX  Dose: 0.4 mg  Take one  capsule by mouth daily.  Refills: 0     tiZANidine 4 mg tablet  Commonly known as: ZANAFLEX  Dose: 4 mg  Take one tablet by mouth every 6-8 hours as needed. NTE 3 doses in 24hrs.  Refills: 0     traZODone 100 mg tablet  Commonly known as: DESYREL  Dose: 2 tablet  Take two tablets by mouth at bedtime daily.  Refills: 0           * This list has 2 medication(s) that are the same as other medications prescribed for you. Read the directions carefully, and ask your doctor or other care provider to review them with you.                STOP taking these medications      isosorbide  mononitrate ER 30 mg tablet,extended release 24 hr  Commonly known as: IMDUR      nitroglycerin  0.4 mg tablet  Commonly known as: NITROSTAT                Where to Get Your Medications        These medications were sent to [facility]  [address], [place]  [place] [address]      Hours: Monday-Friday 6 a.m.-9 p.m. Saturday-Sunday 9 a.m.-5 p.m. Phone: [phone] Phone: [phone]   albuterol  0.083% 2.5 mg /3 mL (0.083 %) nebulizer solution  albuterol  sulfate 90 mcg/actuation HFA aerosol inhaler  carvediloL  12.5 mg tablet  clopiDOGreL  75 mg tablet  hydrALAZINE  50 mg tablet  isosorbide  dinitrate 20 mg tablet  nicotine  14 mg/day patch  rosuvastatin  40 mg tablet         Return Appointments and Scheduled Appointments     Scheduled appointments:      Apr 02, 2024 9:00 AM  Follow-up visit with Lamarr LITTIE Pon, APRN-NP  Cardiovascular Medicine: [facility] (CVM Exam) [address]  [address]  [place]  [place] [address]  [phone]     Apr 02, 2024 1:10 PM  Office visit with Viviane Hoehn, MD  Pulmonology: [facility] (Internal Medicine) [address]  [address]  [place]  [place] [address]  [phone]     Apr 04, 2024 8:00 AM  Left Lower Extremity Arterial Scan with CVM SN ECHO 1  Cardiovascular Medicine: [facility] (CVM Procedural) [address]  [place] [address]  [phone]     Apr 04, 2024 9:00 AM  Resting ABI with CVM SN ECHO 1  Cardiovascular Medicine: [facility] (CVM Procedural) [address]  [place] [address]  [phone]     Apr 14, 2024 9:30 AM  Office visit with Gladis SHAUNNA Party, MD  Cardiovascular Medicine: [facility] (CVM Exam) [address]  [address], [place]  [place]  [place] [address]  [phone]     Apr 14, 2024 1:00 PM  Cardio Onc Return with Kate JONELLE Canes, APRN-NP  Cardiovascular Medicine: [facility] (CVM Exam) [address]  [address], [address]  [place]  [place] [address]  [phone]     Apr 30, 2024 8:00 AM  Office visit with Rosaline JONELLE Archer, DO  Cardiovascular Medicine: [facility] (CVM Exam) [address]  [address], [address]    [place] [address]  [phone]     Sep 23, 2024 2:30 PM  Office visit with Inetta SHAUNNA Hajj, MD  Cardiovascular Medicine: [facility] (  CVM Exam) [address]  [address], [address]  [place]  [place] [address]  [phone]          Things you need to do       Follow up with Bernardino Velma SAUNDERS, MD    Phone: [phone]    Where: [address] [place]  [place] [address]          Contact information for after-discharge care                Durable Medical Equipment       K[facility]    Phone: [phone]    Fax: [phone]    Where: [address], [place] [phone]    Service: Durable Medical Equipment                  Consults, Procedures, Diagnostics, Micro, Pathology   Consults: Cardiology  Surgical Procedures & Dates:    RHC and LHC 12/5  Significant Diagnostic Studies, Micro and Procedures: noted in brief hospital course  Significant Pathology: none                       Discharge Disposition, Condition   Patient Disposition: Home or Self Care [01]  Condition at Discharge: Stable    Code Status   Prior    Patient Instructions     Activity       Activity as Tolerated   As directed      It is important to keep increasing your activity level after you leave the hospital.  Moving around can help prevent blood clots, lung infection (pneumonia) and other problems.  Gradually increasing the number of times you are up moving around will help you return to your normal activity level more quickly.  Continue to increase the number of times you are up to the chair and walking daily to return to your normal activity level. Begin to work toward your normal activity level at discharge          Diet       Low Sodium Diet   As directed      You will need to monitor the amount of sodium in your diet. Do not eat more than 2g (grams) or 2000mg  (milligrams) per day.    If you have questions regarding your diet at home, you may contact a dietitian at [phone].             Discharge education provided to patient.    Additional Orders: Case Management, Supplies, Home Health     Home Health/DME       None            Signed:  Sharyne Endo, MD  03/31/2024      cc:  Primary Care Physician:  Bernardino Velma R      Referring physicians:  No ref. provider found   Additional provider(s):        Did we miss something? If additional records are needed, please fax a request on office letterhead to [phone]. Please include the patient's name, date of birth, fax number and type of information needed. Additional request can be made by email at [email or web address]. For general questions of information about electronic records sharing, call [phone].

## 2024-04-01 ENCOUNTER — Encounter: Admit: 2024-04-01 | Discharge: 2024-04-01 | Payer: MEDICAID

## 2024-04-01 NOTE — Progress Notes [1]
 Date of Service: 04/02/2024           Sean Henderson is a 67 y.o. male.  He follows with Sean Henderson currently.  Established with Sean Henderson in heart failure clinic April 30, 2024.  Also has seen Sean Henderson and Sean Henderson.  He will establish with Sean Henderson 04/14/2024.  He is also scheduled for ABIs and lower extremity arterial's 04/04/2024    TOC 7 - called x 3  Admitted 03/23/2024 discharged 03/29/2024    HPI     Sean Henderson is here today in [facility] for hospital follow-up.  Pertinent Medical History: HFrEF 35%, ischemic cardiomyopathy, previous LAD stent 2007 and 2009, PAD with SFA stenting, CKD, active prostate cancer and COPD/tobacco dependence labile hypertension.     Presented to [facility] with shortness of breath in the setting of hypertension with concern for flash pulmonary edema.  NT proBNP 11,558 high-sensitivity troponins 27.5 30.2-25.7 chest x-ray consistent with volume overload and blood pressure 176/99.  Initially required 4 L nasal cannula on admission weaned to room air.    Patient was diuresed Lasix  40 mg IV bid and repeat echo showed euvolemia.  Given his EF reduction from 45 to 35%, heart failure was consulted and recommended proceeding with a left and right heart catheterization.  Right heart cath showed elevated mean arterial pressure with mildly elevated filling pressures and preserved indices.  He was started on Lasix  40 mg daily and BiDil.  Coronary angiogram showed 90% PDA stenosis and in the absence of angina decision was made to medically manage.    LifeVest was ordered on discharge.  GDMT: Carvedilol  37.5 mg twice daily, Entresto  high-dose 97-103 twice daily, Isordil  10 mg daily hydralazine  25 mg 3 times daily.  Spironolactone  was ordered but not continued at discharge because patient was not taking his PTA  Cardiac rehab was ordered 12/3.  Continued on Plavix  and rosuvastatin  for CAD PAD.  He received IV iron .  Weight on admission 243 pounds and discharge 238 pounds    Creatinine on admission 1.7 and was 2.16 on discharge.  Creatinine 2.2 point-of-care today in clinic.  Weight in clinic 246 pounds and was 238 pounds on discharge.  Blood pressure 142/72 and heart rate 89 SpO2 stable at 98%.    He is here today by himself for follow-up visit.  He reports having some shortness of breath when he walked throughout the hospital.  However, he feels much better than he did from a shortness of breath standpoint when he was admitted.  He has not been checking weights or blood pressures at home.  He has both a scale and blood pressure cuff but needs to obtain new batteries I have encouraged him to do so.  As we went through his medication list he seems to still be taking medications as he was prior to admission.  Reported that he did not pick up his medications from [facility] pharmacy.  He is supposed to be taking Coreg  37.5 mg twice daily, hydralazine  50 mg 3 times daily and Isordil  20 mg 3 times daily.  However he is taking Coreg  25 mg twice daily, hydralazine  100 mg twice daily and Imdur  60 mg daily.  For this reason we will not make any medication adjustments.  I have provided him heart failure zone sheets he will pick up his medications from [facility] today and follow on our after visit summary as he is supposed to be taking.    He is slightly  volume up.  Creatinine 2.2 potassium 5.1.  Discussed his Jardiance  was stopped in the past due to hypoglycemia.  For this reason we will start Jardiance  5 mg daily and he will take 1/2 tablet and we will reassess lab work and how his blood sugars are at follow-up 04/11/2024.    He does still struggle with gout.  We discussed that steroids are not a great long-term solution and he should discuss allopurinol  or some other chronic management plan with Sean Henderson.  He states he has an appointment in January 2026.  Diuretics with heart failure will continue to be part of his life so would like to minimize his gout risk to the best of our ability.      Vitals:    04/02/24 0943   BP: (!) 142/72   BP Source: Arm, Left Upper   Pulse: 79   SpO2: 98%   O2 Device: CPAP/BiPAP   PainSc: Six   Weight: 111.6 kg (246 lb)   Height: 177.8 cm (5' 10)      Body mass index is 35.3 kg/m?.  BP (!) 142/72 (BP Source: Arm, Left Upper, Patient Position: Sitting)  - Pulse 79  - Ht 177.8 cm (5' 10)  - Wt 111.6 kg (246 lb)  - SpO2 98%  - BMI 35.30 kg/m?   BP Readings from Last 3 Encounters:   04/02/24 (!) 142/72   03/29/24 (!) 150/76   03/11/24 139/84      Pulse Readings from Last 3 Encounters:   04/02/24 79   03/29/24 68   03/11/24 73     Wt Readings from Last 8 Encounters:   04/02/24 111.6 kg (246 lb)   03/29/24 108.3 kg (238 lb 12.8 oz)   03/11/24 111.2 kg (245 lb 2.4 oz)   03/11/24 111.2 kg (245 lb 3.2 oz)   02/13/24 111.9 kg (246 lb 11.2 oz)   01/07/24 110.2 kg (243 lb)   12/12/23 115.6 kg (254 lb 12.8 oz)   10/04/23 111.1 kg (245 lb)          Past Medical History:    Arthritis    CAD (coronary artery disease), native coronary artery    Cardiomyopathy    CKD (chronic kidney disease) stage 3, GFR 30-59 ml/min (CMS-HCC)    COPD (chronic obstructive pulmonary disease) (CMS-HCC)    Coronary artery disease    Dizziness    DM (diabetes mellitus) (CMS-HCC)    Embolism and thrombosis of unspecified artery (CMS-HCC)    Generalized headaches    HLD (hyperlipidemia)    Hypertension    Myocardial infarction (CMS-HCC)    OSA (obstructive sleep apnea)    PAD (peripheral artery disease)    Prostate cancer (CMS-HCC)    Seasonal allergic reaction    Stroke (CMS-HCC)    Tobacco abuse        Review of Systems:   As above in HPI      Cardiovascular Studies:  LHC/RHC 03/28/2024    HEMODYNAMICS:    Body surface area 2.3 m2.  Hemoglobin 14.7 g/dL.  Blood pressure 138/79 with a mean of 103 mmHg.  Heart rate 65 beats per minute.     SATURATIONS:    Aorta 97%.  Right atrium 66%.  Pulmonary artery 69%.     PRESSURES:    Right atrium 8 mmHg.  Right ventricle 37/10 mmHg.  Pulmonary artery 33/52 with a mean of 22 mmHg.  Pulmonary capillary wedge pressure 15 mmHg.  Transpulmonary gradient 7 mmHg.  Diastolic  pulmonary gradient 0 mmHg.  Av-O2 difference 5.7 g/dL.  By thermodilution method, cardiac output 5.5 L/minute, cardiac index 2.4 L/minute per meter squared.  By Denney method, cardiac output 4.96 L/minute, cardiac index 2.2 L/minute per meter squared.  Systemic vascular resistance 1352 dynes.second.cm to 5.  Pulmonary vascular resistance 1.3 Woods unit.     LV HEMODYNAMICS:    Central aortic pressure 109/62 with a mean of 81 mmHg.  LV systolic pressure 114 mmHg.  LVEDP 16 mmHg.     SELECTIVE CORONARY ANGIOGRAPHY:    Left main coronary artery:  It arises from the left coronary sinus, bifurcates into left anterior descending and left circumflex artery.  Left main is a large caliber vessel.  It is angiographically normal.  Left anterior descending artery:  It is a large caliber vessel, reaches the apex and wraps around, type 3 configuration.  The proximal-to-mid LAD has a prior stent which has a moderate diffuse in-stent restenosis with areas of 50% stenosis.  LAD gives off 3 diagonal branches.  The first is a small-to-medium caliber which has a mild diffuse disease.  Second is a medium caliber vessel, which divides into superior and inferior division.  It has a mild luminal irregularity.  The third is a small caliber artery, which has also mild luminal irregularity.  Distal LAD is angiographically normal.  Left circumflex artery:  It is a large caliber codominant vessel, which gives rise to 2 marginal branches and then bifurcates into left posterolateral ventricular branch and left posterior descending artery.  The proximal circumflex has a mild luminal irregularity.  The first obtuse OM1 artery has a 60% stenosis in the ostium.  The second OM artery has a mild luminal irregularity.  It has a mild diffuse disease throughout the course.  The mid circumflex has a mild diffuse disease with area of 30% stenosis. Left PLV and PDA are small-to-medium caliber vessels and they have a mild diffuse disease.  Right coronary artery:  It is a large caliber codominant vessel, it arises from the right coronary sinus, bifurcates into a long, medium caliber right posterior descending artery and medium-to-large caliber two right posterolateral ventricular branches.  The proximal RCA is ectatic.  Entire RCA has a moderate diffuse disease.  In the mid RCA, there are areas of 50% stenosis.  RCA is ectatic.  Distal RCA just before bifurcation has a 50% to 60% stenosis.  Ostium, PLV has 60% stenosis.  The right PDA has a 90% stenosis.    - TTE: 03/24/2024 -     The left ventricular systolic function is moderately reduced. The visually estimated ejection fraction is 35%.    The LV is dilated, global hypokinesis.    No intracardiac thrombus.    Grade I (mild) left ventricular diastolic dysfunction. Cannot determine left atrial pressure.    The right ventricular size is normal. The right ventricular systolic function is normal.    This was a 2D echo study only, cardiac valve structures were not fully evaluated.     - Regadenoson  Stress Test: 08/30/2023   SUMMARY/OPINION:  This study is abnormal.  There is a moderate size, mild intensity, predominantly fixed of decreased radiotracer uptake in the basal to apical inferior wall segments consistent with an area of myocardial injury.  Global left ventricular systolic function is moderately reduced.  Calculated ejection fraction 29%.  Hypokinesis is slightly more pronounced in the inferior wall segments.  No evidence of transient ischemic dilatation.  The pharmacologic ECG portion of the study is  nondiagnostic for ischemia.  Comparison is made with a prior D SPECT study completed 11/15/2021.  Ejection fraction was 35%.  Calculated ejection fraction is less on current study at 29%.  The perfusion pattern is quite similar.  In aggregate the current study is high risk in regards to predicted annual cardiovascular mortality rate.         Physical Exam:  General Appearance: In NAD  Neck Veins: normal JVP 7-8 cm, but challenging to assess with neck thick, neck veins are not distended; no HJR   Chest Inspection: chest is normal in appearance   Respiratory Effort: breathing comfortably, no respiratory distress   Auscultation/Percussion: lungs clear to auscultation, no rales, rhonchi or wheezing   Cardiac Rhythm: regular rhythm and normal rate   Cardiac Auscultation: S1, S2 normal, no rub, no definite S3  or S4   Murmurs: no murmur   Peripheral Circulation: normal peripheral circulation   Pedal Pulses: normal symmetric pedal pulses   Lower Extremity Edema: no lower extremity edema   Abdominal Exam: soft, non-tender, no obvious masses, bowel sounds normal   Gait & Station: walks without assistance   Orientation: oriented to person, place and time   Affect & Mood: appropriate and sustained affect   Language and Memory: patient responsive and seems to comprehend information   Neurologic Exam: neurological assessment grossly intact   Vital signs were reviewed    Problems addressed today:   Encounter Diagnoses   Name Primary?    Chronic combined systolic and diastolic congestive heart failure (CMS-HCC) Yes    Coronary artery disease involving native coronary artery of native heart with angina pectoris     Essential hypertension     Pure hypercholesterolemia     Peripheral artery disease (HCC) with repeat stenting left leg     Type 2 diabetes mellitus with other specified complication, unspecified whether long term insulin  use (CMS-HCC)     Stage 3b chronic kidney disease (CMS-HCC)     Chronic obstructive pulmonary disease, unspecified COPD type (CMS-HCC)         Assessment/Plan:   HFrEF:  The most recent echo showed an EF 35% 03/24/2024 the patient is C (structural heart disease with prior or current symptoms of HF) and describes HF Functional Class NYHA II (slight limitation of physical activity, comfortable at rest, but ordinary physical activity results in symptoms of HF e.g., walking or climbing stairs) symptoms.    Lab Results   Component Value Date/Time    NTPROBNP 2,127 (H) 04/02/2024 10:36 AM    NTPROBNP 4,090 (H) 03/29/2024 05:53 AM    NTPROBNP 3,162 (H) 03/28/2024 05:37 AM   -Dry weight: 238 pounds    Ischemic cardiomyopathy:  The current regimen includes below:  Does not have ICD therapy given newly reduced EF.  Sent on LifeVest December 2025    GDMT Current Dose Changes made at visit   BB Very high dose carvedilol  37.5 mg twice daily Patient still taking 25 bid.  He was to pick up today.   ACEI/ARB/ARNI Entresto  97-103 Continue to assess renal function   Aldosterone antagonist Documented ordered, but not continued on discharge.  Patient does not know reason he would not take. Suspect hyperkalemia as reason it has been stopped in the past.  Potassium 5.1 today.   SGLT2i Previously on Jardiance  unclear why it was stopped. Patient reports hypoglycemia back in 2023.      Starting Jardiance  5 mg daily  Repeat BMP at follow-up  Also will trend blood sugars  Hydralazine /Nitrate Hydralazine  25 mg 3 times daily  Isordil  10 mg 3 times daily Patient taking at 100 mg twice daily and still Imdur  60 daily.   Ivabradine May need to consider, but would add other GDMT first if renal function allows     HRMT  Sent with LifeVest due to new EF 35% LifeVest intact no alarms   Diuretics Lasix  40 mg p.o. daily Reports taking 40 mg daily with good urine output   Cardiac rehab Ordered 03/26/2024 inpatient     Candidacy for Adv HF therapies CKD, PAD potentially       >Blood pressure today 142/72.  However, you can see as above he is still taking previous antihypertensive regimen.  Given his HFrEF we will have him pick up prescriptions at [facility] today and close follow-up with me on 12/19 to reassess labs and confirm he is taking his medications appropriately and reassess blood pressures.  >He is to get blood pressure cuff and scale batteries today as he has them both at home.    CAD  - s/p PCI LAD (2007, 2009)  - Plavix  75 mg daily and rosuvastatin  20 mg daily   > LHC 12/5 90% PDA stenosis-not treated due to no angina  -No anginal symptoms today.  Discussed this with patient.     Hx LV thrombus  - PTA Eliquis  5 mg twice as above  - 12/3 limited TTE noted resolution of thrombus. Given low EF, would continue [facility] indefinitely   Denies any signs and symptoms of bleeding     PAD   - s/p multiple left SFA stents (2014, 2016)  > Continue Plavix  75 mg daily and rosuvastatin  20 mg daily     HTN  As above in GDMT table  BP Readings from Last 3 Encounters:   04/02/24 (!) 142/72   03/29/24 (!) 150/76   03/11/24 139/84     CKD  CKD - Stage 3b  - baseline Cr: 1.6-2.0  - Cr on admission: 1.70  Lab Results   Component Value Date/Time    CR 2.2 (H) 04/02/2024 09:53 AM    CR 2.16 (H) 03/29/2024 05:53 AM    CR 2.16 (H) 03/28/2024 05:37 AM   Continue to monitor.  If anything I said he has a little volume today.    Hyperkalemia  Potassium 5.1.  We are adding Jardiance  5 mg today.  Will recheck point-of-care BMP 12/19.    DMII  Lab Results   Component Value Date    HGBA1C 6.8 (H) 03/24/2024   Would strongly consider SGLT2 inhibitor if GFR allows  GFR calculated at 36.  Patient reports this was stopped due to hypoglycemia  Blood sugar today 126 and fasting.  Will start cautiously Jardiance  5 mg daily and try to increase to 10 mg daily at follow-up    HLD  Lab Results   Component Value Date    CHOL 82 06/26/2023    TRIG 74 06/26/2023    HDL 30 (L) 06/26/2023    LDL 42 06/26/2023    VLDL 85.1 06/26/2023    NONHDLCHOL 52 06/26/2023   Rosuvastatin  20 mg daily     IDA  Lab Results   Component Value Date    IRON  32 (L) 03/25/2024    HGB 14.8 03/29/2024    PSAT 10 (L) 03/25/2024    FERRITIN 87 03/25/2024    TIBC 323 03/25/2024   > Venofer  300 mg x 3 doses ordered per primary team (12/2-12/4)    COPD  Tobacco use   - 1-1.5 ppd   > management per primary team        Follow up:   He will establish with Sean Henderson April 30, 2024.  He will see me back soon 04/11/2024 for close follow-up once he gets on his hospital prescribed antihypertensive regimen to ensure improved from 142/72 and heart rate of 79 with newly found HFrEF.    He also follows with lower extremity procedures 04/04/2024 ordered by Dr. Martell for PAD and will establish with Sean Henderson 04/14/2024 for ICD referral that was placed during his hospitalization.        I have personally documented the HPI, physical exam and medical decision making.  Counseling and education: I reviewed recent lab results and trends, current medications, necessary changes in treatment, discussed heart failure signs & symptoms, and importance of logging daily weights in addition to maintaining a low sodium diet & fluid restriction.  Plan of care was discussed and patient verbalized understanding.  Please see AVS for teaching and instructions specific to this visit. Patient advised to call our office if he has any questions, clarification needed, worsening symptoms, or concerns prior to the next appointment.       Thank you for allowing me to participate in the care of this patient. If you have any questions please do not hesitate to contact our office.    Mallie Pon, APRN  [facility]  Phone: [phone] Fax: [phone]  Pager: [phone]   Collaborating Physician: Dr. Elenore Kearns       I spent 40 minutes on this encounter today including time for clinic prep/chart review, performing a medically appropriate examination and/or evaluation, and documentation in the electronic health record.  Total counseling time with the patient today was approximately 20-25 minutes discussing his heart disease.  We reviewed the above treatment plan, went over risks/benefits/alternatives to the therapies, and all questions were answered to his satisfaction.          *This note was dictated using Office Manager system. Please know this accounts for any grammatical, contextual or spelling errors.       albuterol  0.083% (PROVENTIL ) 2.5 mg /3 mL (0.083 %) nebulizer solution Inhale 3 mL solution by nebulizer as directed every 4 hours as needed. Indications: asthma attack    albuterol  sulfate (PROAIR  HFA) 90 mcg/actuation HFA aerosol inhaler Inhale one puff to two puffs by mouth into the lungs every 4 hours as needed for Wheezing (or shortness of breath). Indications: asthma attack    betamethasone dipropionate (DIPROSONE) 0.05 % topical ointment USE TO WORST ITCHY AREAS ON BODY TWICE DAILY AS NEEDED    budesonide -glycopyr-formoterol  (BREZTRI  AEROSPHERE) 160-9-4.8 mcg/actuation inhaler Inhale two puffs by mouth into the lungs twice daily.    carvediloL  (COREG ) 12.5 mg tablet Take three tablets by mouth twice daily with meals. Take with food.  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    clopiDOGreL  (PLAVIX ) 75 mg tablet Take one tablet by mouth daily. Indications: a sudden worsening of angina called acute coronary syndrome    DEXCOM G7 SENSOR sensor device Use one each as directed daily.    dulaglutide (TRULICITY) 0.75 mg/0.5 mL injection pen Inject 0.5 mL under the skin every 7 days.    DUPIXENT PEN 300 mg/2 mL injectable PEN Inject 2 mL under the skin every 7 days.  ELIQUIS  5 mg tablet TAKE 1 TABLET (5 MG) BY MOUTH TWICE DAILY    empagliflozin  (JARDIANCE ) 10 mg tablet Take half tablet by mouth daily.    fluticasone  propionate (FLONASE) 50 mcg/actuation nasal spray, suspension Apply one spray to each nostril as directed daily.    furosemide  (LASIX ) 20 mg tablet Take two tablets by mouth every morning. Take for lower extremity swelling    gabapentin  (NEURONTIN ) 300 mg capsule Take one capsule by mouth three times daily. (Patient taking differently: Take two capsules by mouth twice daily as needed. Indications: PAIN) hydrALAZINE  (APRESOLINE ) 50 mg tablet Take one tablet by mouth three times daily. Indications: chronic heart failure    HYDROcodone /acetaminophen  (NORCO) 7.5/325 mg tablet Take one tablet by mouth twice daily as needed for Pain. Moderate to severe pain.    isosorbide  dinitrate (ISORDIL  TITRADOSE) 20 mg tablet Take one tablet by mouth three times daily. Indications: chronic heart failure    mupirocin  (CENTANY ) 2 % topical ointment Apply  topically to affected area three times daily. Apply to the front of the nose as needed up to three times per day    Nebulizer Accessories kit Please dispense one nebulizer kit    nicotine  (NICODERM CQ ) 14 mg/day patch Apply one patch to top of skin as directed daily. Rotate patch location.  Indications: stop smoking    rosuvastatin  (CRESTOR ) 40 mg tablet Take one tablet by mouth at bedtime daily. Indications: excessive fat in the blood    sacubitriL -valsartan  (ENTRESTO ) 97-103 mg tablet Take one tablet by mouth twice daily.    tamsulosin (FLOMAX) 0.4 mg capsule Take one capsule by mouth daily. (Patient taking differently: Take one capsule by mouth daily as needed.)    tiZANidine (ZANAFLEX) 4 mg tablet Take one tablet by mouth every 6-8 hours as needed. NTE 3 doses in 24hrs.    traZODone (DESYREL) 100 mg tablet Take two tablets by mouth at bedtime daily. (Patient taking differently: Take two tablets by mouth at bedtime as needed.)           Orders Placed This Encounter      Nt-Pro-Bnp      Magnesium       Lab Sample Hold      Lavender Top Tube

## 2024-04-02 ENCOUNTER — Encounter: Admit: 2024-04-02 | Discharge: 2024-04-02 | Payer: MEDICAID

## 2024-04-02 ENCOUNTER — Ambulatory Visit: Admit: 2024-04-02 | Discharge: 2024-04-02 | Payer: MEDICAID

## 2024-04-02 ENCOUNTER — Ambulatory Visit: Admit: 2024-04-02 | Discharge: 2024-04-02 | Payer: MEDICARE

## 2024-04-02 VITALS — BP 134/74 | HR 80 | Temp 97.50000°F | Resp 20 | Ht 70.0 in | Wt 246.0 lb

## 2024-04-02 VITALS — BP 142/72 | HR 79 | Ht 70.0 in | Wt 246.0 lb

## 2024-04-02 DIAGNOSIS — I10 Essential (primary) hypertension: Secondary | ICD-10-CM

## 2024-04-02 DIAGNOSIS — J449 Chronic obstructive pulmonary disease, unspecified: Secondary | ICD-10-CM

## 2024-04-02 DIAGNOSIS — I25119 Atherosclerotic heart disease of native coronary artery with unspecified angina pectoris: Secondary | ICD-10-CM

## 2024-04-02 DIAGNOSIS — I739 Peripheral vascular disease, unspecified: Secondary | ICD-10-CM

## 2024-04-02 DIAGNOSIS — N1832 Stage 3b chronic kidney disease (CMS-HCC): Secondary | ICD-10-CM

## 2024-04-02 DIAGNOSIS — E1169 Type 2 diabetes mellitus with other specified complication: Secondary | ICD-10-CM

## 2024-04-02 DIAGNOSIS — J438 Other emphysema: Principal | ICD-10-CM

## 2024-04-02 DIAGNOSIS — E7800 Pure hypercholesterolemia, unspecified: Secondary | ICD-10-CM

## 2024-04-02 LAB — NT-PRO-BNP: ~~LOC~~ BKR NT-PRO-BNP: 212 pg/mL — ABNORMAL HIGH (ref <125)

## 2024-04-02 LAB — MAGNESIUM: ~~LOC~~ BKR MAGNESIUM: 2.2 mg/dL (ref 1.6–2.6)

## 2024-04-02 MED ORDER — EMPAGLIFLOZIN 10 MG PO TAB
5 mg | ORAL_TABLET | Freq: Every day | ORAL | 3 refills | 30.00000 days | Status: AC
Start: 2024-04-02 — End: ?
  Filled 2024-04-07: qty 45, 45d supply, fill #0

## 2024-04-02 NOTE — Patient Instructions [37]
 It was a pleasure seeing you today.      My nurse is Clemens Catholic, Charity fundraiser.  She can be reached at [phone].    Please contact my nurse with any signs and symptoms of worsening productive cough with thick secretions, blood in sputum, chest pain or tightness, shortness of breath, fever, chills, night sweats, or any questions or concerns.    For refills on medications, please have your pharmacy request a refill authorization either electronically or via fax to our office at [phone]. Please allow at least 3 business days for refill requests.    For urgent issues after business hours, weekends, or holidays please call [phone] and request for the pulmonary fellow to be paged.    If you need to cancel or reschedule an appointment, please call [phone].

## 2024-04-02 NOTE — Progress Notes [1]
 Pulmonary Clinic Note    Date of Service: 04/02/2024    Subjective:          Sean Henderson is a 68 y.o. male with OSA, COPD, HFrecoveredEF, CKD PAD and prostate cancer s/p radiation therapy and current tobacco use who presents for follow-up of COPD. Last seen December 2024.    He is on Breztri  for maintenance therapy -- compliant and taking daily. PRN albuterol , takes it BID, and it helps. Had a AECOPD in November, treated with prednisone  after a visit to urgent care. Endorses shortness of breath with walking on flat surfaces. No cough at this time.     Had a recent hospital admission for HFpEF exacerbation from 11/30-12/6. Underwent RHC and LHC for persistent chest pain following diuresis.  Left heart cath revealed moderate diffuse in-stent restenosis in the LAD (max 50%), 60% ostial OM1 stenosis, and 90% stenosis of the right PDA. In the absence of overt angina, cardiology recommended medical management. Right heart cath revealed elevated MAP with mildly elevated filling pressures. He was continued on PTA PO Lasix  40 mg daily. GDMT was optimized.    Has OSA on CPAP. Compliant. Follows with sleep medicine ([facility]).    He is on Dupixent for eczema per Dermatology.    Smoking 1/2 ppd. Has tried Chantix  and Nicotrol , neither have helped.    Allergy symptoms continue on Zyrtec. Occasional PND. Referred to ENT after his last visit, saw Dr. Bernadette 04/2023 - recommended smoking cessation, Prilosec, and laryngology referral.    Review of Systems:  Review of Systems   Constitutional: Negative.    HENT:  Positive for postnasal drip and rhinorrhea.    Respiratory:  Positive for shortness of breath. Negative for cough and wheezing.    Cardiovascular:  Negative for chest pain.   Allergic/Immunologic: Positive for environmental allergies.   All other systems reviewed and are negative.      Medications:          albuterol  0.083% (PROVENTIL ) 2.5 mg /3 mL (0.083 %) nebulizer solution Inhale 3 mL solution by nebulizer as directed every 4 hours as needed. Indications: asthma attack    albuterol  sulfate (PROAIR  HFA) 90 mcg/actuation HFA aerosol inhaler Inhale one puff to two puffs by mouth into the lungs every 4 hours as needed for Wheezing (or shortness of breath). Indications: asthma attack    betamethasone dipropionate (DIPROSONE) 0.05 % topical ointment USE TO WORST ITCHY AREAS ON BODY TWICE DAILY AS NEEDED    budesonide -glycopyr-formoterol  (BREZTRI  AEROSPHERE) 160-9-4.8 mcg/actuation inhaler Inhale two puffs by mouth into the lungs twice daily.    carvediloL  (COREG ) 12.5 mg tablet Take three tablets by mouth twice daily with meals. Take with food.  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    clopiDOGreL  (PLAVIX ) 75 mg tablet Take one tablet by mouth daily. Indications: a sudden worsening of angina called acute coronary syndrome    DEXCOM G7 SENSOR sensor device Use one each as directed daily.    dulaglutide (TRULICITY) 0.75 mg/0.5 mL injection pen Inject 0.5 mL under the skin every 7 days.    DUPIXENT PEN 300 mg/2 mL injectable PEN Inject 2 mL under the skin every 7 days.    ELIQUIS  5 mg tablet TAKE 1 TABLET (5 MG) BY MOUTH TWICE DAILY    empagliflozin  (JARDIANCE ) 10 mg tablet Take half tablet by mouth daily.    fluticasone  propionate (FLONASE)  50 mcg/actuation nasal spray, suspension Apply one spray to each nostril as directed daily.    furosemide  (LASIX ) 20 mg tablet Take two tablets by mouth every morning. Take for lower extremity swelling    gabapentin  (NEURONTIN ) 300 mg capsule Take one capsule by mouth three times daily. (Patient taking differently: Take two capsules by mouth twice daily as needed. Indications: PAIN)    hydrALAZINE  (APRESOLINE ) 50 mg tablet Take one tablet by mouth three times daily. Indications: chronic heart failure    HYDROcodone /acetaminophen  (NORCO) 7.5/325 mg tablet Take one tablet by mouth twice daily as needed for Pain. Moderate to severe pain.    isosorbide  dinitrate (ISORDIL  TITRADOSE) 20 mg tablet Take one tablet by mouth three times daily. Indications: chronic heart failure    mupirocin  (CENTANY ) 2 % topical ointment Apply  topically to affected area three times daily. Apply to the front of the nose as needed up to three times per day    Nebulizer Accessories kit Please dispense one nebulizer kit    nicotine  (NICODERM CQ ) 14 mg/day patch Apply one patch to top of skin as directed daily. Rotate patch location.  Indications: stop smoking    rosuvastatin  (CRESTOR ) 40 mg tablet Take one tablet by mouth at bedtime daily. Indications: excessive fat in the blood    sacubitriL -valsartan  (ENTRESTO ) 97-103 mg tablet Take one tablet by mouth twice daily.    tamsulosin (FLOMAX) 0.4 mg capsule Take one capsule by mouth daily. (Patient taking differently: Take one capsule by mouth daily as needed.)    tiZANidine (ZANAFLEX) 4 mg tablet Take one tablet by mouth every 6-8 hours as needed. NTE 3 doses in 24hrs.    traZODone (DESYREL) 100 mg tablet Take two tablets by mouth at bedtime daily. (Patient taking differently: Take two tablets by mouth at bedtime as needed.)       Physical Exam:  Vitals:    04/02/24 1349   BP: 134/74   BP Source: Arm, Left Upper   Pulse: 80   Temp: 36.4 ?C (97.5 ?F)   Resp: 20   SpO2: 98%   TempSrc: Oral   PainSc: Six   Weight: 111.6 kg (246 lb)   Height: 177.8 cm (5' 10)       Body mass index is 35.3 kg/m?SABRA     GENERAL: Well-developed, Alert and oriented. In no apparent distress.    HEENT: Normocephalic and atraumatic.  LUNGS: Diminished breath sounds bilaterally, no wheeze  HEART: Regular rate. Regular rhythm. No murmur.  ABDOMEN: Soft. Non-tender. Non-distended.   EXTREMITIES: No edema. No clubbing. No cyanosis.   NEUROLOGIC: Cranial nerves II-XII are grossly intact.  PSYCHIATRIC: Normal mood and affect.  SKIN: No rashes observed.    Labs:  Eos 680 2023     Pulmonary Function Testing:    Complete PFTs 10/01/2020  FVC: 2.15 or 55%  FEV1: 1.51 L liters or 50%  Ratio: 70%  RV: 2.77 L or 131%  TLC: 5.04 L or 79%  DLCO: 79%     Complete PFTs 03/12/2019  FVC: 2.07 or 52%  FEV1: 1.5 or 49%  Ratio 72%  RV: 124%  TLC:  76%  DLCO uncorrected: 82%    Imaging:  CT chest 12/2023  1.  Calcified granulomas without new suspicious nodule. (Lung-RADS 1 -   Negative). Management: 24-month screening CT.   2.  Mild emphysema.   3.  Mild cardiomegaly with persistent small pericardial effusion.   4.  Coronary artery calcification and stenting indicates atherosclerosis.  CT chest 11/2022  Mild emphysema with no new or enlarging lung nodule. Recommend continued   annual low-dose CT lung screening in 12 months.     CT chest 11/2021  Mild emphysema with no new or enlarging lung nodule.     CT chest  11/23/2020  Mild emphysema with no new or enlarging lung nodule.     Assessment:  Sean Henderson is a 68 y.o. male with OSA, COPD, HFrecoveredEF, CKD PAD and prostate cancer s/p radiation therapy and current tobacco use who presents for follow-up of COPD. Discussed CPAP compliance. Smoking cessation discussed and strongly encouraged.    #COPD  #Viral URI  #CHF  #OSA    #Current tobacco use  #Allergic rhinitis    Plan:  -Refer to pulmonary rehab   -Repeat PFT  -Continue Breztri  2 puffs twice daily   -Continue albuterol  PRN  -Smoking cessation discussed again  -LDCT for lung cancer screening September 2026.   -UTD with flu shot and covid booster.  Prevnar 20 due in 2026.   -Continue Zyrtec, nasal azelastine  and Flonase. Will refer to ENT.    Return to clinic in 3 months    Patient seen and discussed with Dr. Knox Viviane Hoehn, MBBS  Pulmonary & Critical Care Medicine Fellow

## 2024-04-03 ENCOUNTER — Encounter: Admit: 2024-04-03 | Discharge: 2024-04-03 | Payer: MEDICAID

## 2024-04-03 ENCOUNTER — Ambulatory Visit: Admit: 2024-04-03 | Discharge: 2024-04-04 | Payer: MEDICAID

## 2024-04-03 DIAGNOSIS — I5042 Chronic combined systolic (congestive) and diastolic (congestive) heart failure: Principal | ICD-10-CM

## 2024-04-04 ENCOUNTER — Encounter: Admit: 2024-04-04 | Discharge: 2024-04-04 | Payer: MEDICAID

## 2024-04-04 ENCOUNTER — Ambulatory Visit: Admit: 2024-04-04 | Discharge: 2024-04-04 | Payer: MEDICAID

## 2024-04-04 ENCOUNTER — Ambulatory Visit: Admit: 2024-04-04 | Discharge: 2024-04-04 | Payer: MEDICARE

## 2024-04-07 ENCOUNTER — Encounter: Admit: 2024-04-07 | Discharge: 2024-04-07 | Payer: MEDICAID

## 2024-04-08 ENCOUNTER — Encounter: Admit: 2024-04-08 | Discharge: 2024-04-08 | Payer: MEDICAID

## 2024-04-08 NOTE — Telephone Encounter [36]
 Called patient to see if he would be interested in attending pulm rehab. Patient stated that he was interested and asked if I could call back tomorrow 12/17 I told him I could do that.

## 2024-04-09 ENCOUNTER — Encounter: Admit: 2024-04-09 | Discharge: 2024-04-09 | Payer: MEDICAID

## 2024-04-09 NOTE — Telephone Encounter [36]
 called patient to see if he would be interested in doing his pulmonary rehab. Patient was not available and [facility] was full. I will attempt to call again later.

## 2024-04-10 NOTE — Progress Notes [1]
 Date of Service: 04/11/2024           Sean Henderson is a 68 y.o. male.  He follows with Dr. Fayette most recently.  Established with Dr. Vincenza in heart failure clinic April 30, 2024.  Also has seen Dr. Lue Bring and Dr. Leva.  He will establish with Dr. Maylene 04/14/2024.  He is also scheduled for ABIs and lower extremity arterial's 04/04/2024      HPI     Sean Henderson is here today in [place] [facility] for hospital follow-up.  Pertinent Medical History: HFrEF 35%, ischemic cardiomyopathy, previous LAD stent 2007 and 2009, PAD with SFA stenting, CKD, active prostate cancer and COPD/tobacco dependence labile hypertension.     Hospitalization at [facility] 11/30 - 03/29/2024 with hypertensive urgency resulting in flash pulmonary edema and acute on chronic respiratory failure and heart failure exacerbation.  Diuresed with Lasix  40 mg IV bid.  EF reduced from 45 to 35% he underwent left and right heart catheterization RHC showed elevated mean arterial pressure with mildly elevated filling pressures and preserved indices.  Coronary angiogram showed 90% PDA stenosis and in the absence of angina it was medically managed.  LifeVest on DC.  Weight on admission 243 pounds discharge 238 pounds.    He had not picked up new medication regimen on discharge.  BP was 142/72 HR 79  He was supposed to be taking Coreg  37.5 mg twice daily hydralazine  50 3 times daily Isordil  20 3 times daily.  He was actually taking Coreg  25 mg twice daily hydralazine  100 mg twice daily and Imdur  60 daily.  In addition to Entresto  97-103 mg twice daily.  Continued on Plavix  and rosuvastatin  for CAD PAD.     He was to start Jardiance  at reduced dose 5 mg daily due to previous hypoglycemia reported on higher dose    Encouraged to follow-up with PCP Dr. Bernardino for gout.  Steroids not a good long-term plan.    His lower extremity arterial studies were completed and per Dr. Trinidad report no changes, recommendation to quit smoking completely and repeat study in 6 months time.    He is here today by himself for follow-up visit.  Vast majority of our visit spent on med reconciliation.  He is not taking amlodipine  nor should he be as we would like to optimize GDMT so I have removed this from his medication list.  Confirmed he is taking carvedilol  37.5 mg twice daily, hydralazine  50 mg 3 times daily, Isordil  he was taking 2 tablets at night as he thought it was a diuretic.  He is taking furosemide  40 mg which is 2 tablets at night around 3 to 4 AM which is how he prefers to take his diuretic.  We clarified he should take his Isordil  3 times daily at the same times he takes his hydralazine .  Confirmed he is taking high dose Entresto  97-103 mg twice daily.  He is taking his Jardiance  he thinks a full tablet but not consistently.  He has had some hypoglycemia reports 1 blood sugar at 39.  Reports that Dr. Bernardino does manage his Trulicity.  We discussed that heart failure and cardiac plus renal benefit and blood sugar are beneficial and Jardiance  and would like him to discuss with Dr. Bernardino closer monitoring of blood sugars.  We discussed his indication for the blood thinner.  He feels sometimes it upsets his stomach.    He verbalized his blood pressures to me at home 150s  to 160s lowest 120s to 130s/70s to 80s.  Today 110/64 he reports his blood pressures are not that low at home.  Send his weights are around 240 pounds in his underwear.  Sometimes they are a little bit higher but mostly around there.  He appears euvolemic today.    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  LifeVest difficult for him to tolerate but he was told by company to put it back on so he has it on today.  He will meet with Dr. Maylene 04/14/2024 but think it would be worth reassessing his ejection fraction with better controlled blood pressure.  Discussed with patient the importance of adhering to his regimen and closely monitoring his blood pressure so we can optimize to try to give his heart the best chance of improvement.  He verbalized understanding.  Continue current regimen and bring weights and blood pressures to follow-up visit with Dr. Vincenza January 7.      Vitals:    04/11/24 1018   BP: 110/64   BP Source: Arm, Left Upper   Pulse: 66   SpO2: 96%   O2 Device: CPAP/BiPAP   PainSc: Zero   Weight: 110.8 kg (244 lb 3.2 oz)   Height: 177.8 cm (5' 10)        Body mass index is 35.04 kg/m?.  BP 110/64 (BP Source: Arm, Left Upper, Patient Position: Sitting)  - Pulse 66  - Ht 177.8 cm (5' 10)  - Wt 110.8 kg (244 lb 3.2 oz)  - SpO2 96%  - BMI 35.04 kg/m?   BP Readings from Last 3 Encounters:   04/11/24 110/64   04/04/24 124/86   04/02/24 134/74      Pulse Readings from Last 3 Encounters:   04/11/24 66   04/02/24 80   04/02/24 79     Wt Readings from Last 8 Encounters:   04/11/24 110.8 kg (244 lb 3.2 oz)   04/04/24 111.6 kg (246 lb)   04/02/24 111.6 kg (246 lb)   04/02/24 111.6 kg (246 lb)   03/29/24 108.3 kg (238 lb 12.8 oz)   03/11/24 111.2 kg (245 lb 2.4 oz)   03/11/24 111.2 kg (245 lb 3.2 oz)   02/13/24 111.9 kg (246 lb 11.2 oz)          Past Medical History:    Arthritis    CAD (coronary artery disease), native coronary artery    Cardiomyopathy    CKD (chronic kidney disease) stage 3, GFR 30-59 ml/min (CMS-HCC)    COPD (chronic obstructive pulmonary disease) (CMS-HCC)    Coronary artery disease    Dizziness    DM (diabetes mellitus) (CMS-HCC)    Embolism and thrombosis of unspecified artery (CMS-HCC)    Generalized headaches    HLD (hyperlipidemia)    Hypertension    Myocardial infarction (CMS-HCC)    OSA (obstructive sleep apnea)    PAD (peripheral artery disease)    Prostate cancer (CMS-HCC)    Seasonal allergic reaction    Stroke (CMS-HCC)    Tobacco abuse        Review of Systems:   As above in HPI      Cardiovascular Studies:  LHC/RHC 03/28/2024    HEMODYNAMICS:    Body surface area 2.3 m2.  Hemoglobin 14.7 g/dL.  Blood pressure 138/79 with a mean of 103 mmHg.  Heart rate 65 beats per minute. SATURATIONS:    Aorta 97%.  Right atrium 66%.  Pulmonary artery 69%.     PRESSURES:  Right atrium 8 mmHg.  Right ventricle 37/10 mmHg.  Pulmonary artery 33/52 with a mean of 22 mmHg.  Pulmonary capillary wedge pressure 15 mmHg.  Transpulmonary gradient 7 mmHg.  Diastolic pulmonary gradient 0 mmHg.  Av-O2 difference 5.7 g/dL.  By thermodilution method, cardiac output 5.5 L/minute, cardiac index 2.4 L/minute per meter squared.  By Denney method, cardiac output 4.96 L/minute, cardiac index 2.2 L/minute per meter squared.  Systemic vascular resistance 1352 dynes.second.cm to 5.  Pulmonary vascular resistance 1.3 Woods unit.     LV HEMODYNAMICS:    Central aortic pressure 109/62 with a mean of 81 mmHg.  LV systolic pressure 114 mmHg.  LVEDP 16 mmHg.     SELECTIVE CORONARY ANGIOGRAPHY:    Left main coronary artery:  It arises from the left coronary sinus, bifurcates into left anterior descending and left circumflex artery.  Left main is a large caliber vessel.  It is angiographically normal.  Left anterior descending artery:  It is a large caliber vessel, reaches the apex and wraps around, type 3 configuration.  The proximal-to-mid LAD has a prior stent which has a moderate diffuse in-stent restenosis with areas of 50% stenosis.  LAD gives off 3 diagonal branches.  The first is a small-to-medium caliber which has a mild diffuse disease.  Second is a medium caliber vessel, which divides into superior and inferior division.  It has a mild luminal irregularity.  The third is a small caliber artery, which has also mild luminal irregularity.  Distal LAD is angiographically normal.  Left circumflex artery:  It is a large caliber codominant vessel, which gives rise to 2 marginal branches and then bifurcates into left posterolateral ventricular branch and left posterior descending artery.  The proximal circumflex has a mild luminal irregularity.  The first obtuse OM1 artery has a 60% stenosis in the ostium.  The second OM artery has a mild luminal irregularity.  It has a mild diffuse disease throughout the course.  The mid circumflex has a mild diffuse disease with area of 30% stenosis.  Left PLV and PDA are small-to-medium caliber vessels and they have a mild diffuse disease.  Right coronary artery:  It is a large caliber codominant vessel, it arises from the right coronary sinus, bifurcates into a long, medium caliber right posterior descending artery and medium-to-large caliber two right posterolateral ventricular branches.  The proximal RCA is ectatic.  Entire RCA has a moderate diffuse disease.  In the mid RCA, there are areas of 50% stenosis.  RCA is ectatic.  Distal RCA just before bifurcation has a 50% to 60% stenosis.  Ostium, PLV has 60% stenosis.  The right PDA has a 90% stenosis.    - TTE: 03/24/2024 -     The left ventricular systolic function is moderately reduced. The visually estimated ejection fraction is 35%.    The LV is dilated, global hypokinesis.    No intracardiac thrombus.    Grade I (mild) left ventricular diastolic dysfunction. Cannot determine left atrial pressure.    The right ventricular size is normal. The right ventricular systolic function is normal.    This was a 2D echo study only, cardiac valve structures were not fully evaluated.     - Regadenoson  Stress Test: 08/30/2023   SUMMARY/OPINION:  This study is abnormal.  There is a moderate size, mild intensity, predominantly fixed of decreased radiotracer uptake in the basal to apical inferior wall segments consistent with an area of myocardial injury.  Global left ventricular systolic function is  moderately reduced.  Calculated ejection fraction 29%.  Hypokinesis is slightly more pronounced in the inferior wall segments.  No evidence of transient ischemic dilatation.  The pharmacologic ECG portion of the study is nondiagnostic for ischemia.  Comparison is made with a prior D SPECT study completed 11/15/2021.  Ejection fraction was 35%.  Calculated ejection fraction is less on current study at 29%.  The perfusion pattern is quite similar.  In aggregate the current study is high risk in regards to predicted annual cardiovascular mortality rate.         Physical Exam:  General Appearance: In NAD, aura of cigarette use  Neck Veins: normal JVP 7cm, but challenging to assess with neck thick at 90 degrees, neck veins are not distended; no HJR   Chest Inspection: chest is normal in appearance   Respiratory Effort: breathing comfortably, no respiratory distress, slight dim/crackle in RLL slightly improved with cough  Auscultation/Percussion: lungs clear to auscultation, no rales, rhonchi or wheezing   Cardiac Rhythm: regular rhythm and normal rate   Cardiac Auscultation: S1, S2 normal, no rub, no definite S3  or S4   Murmurs: no murmur   Peripheral Circulation: normal peripheral circulation   Pedal Pulses: normal symmetric pedal pulses   Lower Extremity Edema: no lower extremity edema   Abdominal Exam: soft, non-tender, no obvious masses, bowel sounds normal   Gait & Station: walks without assistance   Orientation: oriented to person, place and time   Affect & Mood: appropriate and sustained affect   Language and Memory: patient responsive and seems to comprehend information   Neurologic Exam: neurological assessment grossly intact   Vital signs were reviewed    Problems addressed today:   Encounter Diagnoses   Name Primary?    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% 2004 Yes    Coronary artery disease involving native coronary artery of native heart with angina pectoris     Chronic combined systolic and diastolic congestive heart failure (CMS-HCC)     Essential hypertension     Pure hypercholesterolemia     Stage 3b chronic kidney disease (CMS-HCC)     Chronic obstructive pulmonary disease, unspecified COPD type (CMS-HCC)     Cigarette nicotine  dependence without complication           Assessment/Plan:   HFrEF:  The most recent echo showed an EF 35% 03/24/2024 the patient is C (structural heart disease with prior or current symptoms of HF) and describes HF Functional Class NYHA II (slight limitation of physical activity, comfortable at rest, but ordinary physical activity results in symptoms of HF e.g., walking or climbing stairs) symptoms.    Lab Results   Component Value Date/Time    NTPROBNP 2,127 (H) 04/02/2024 10:36 AM    NTPROBNP 4,090 (H) 03/29/2024 05:53 AM    NTPROBNP 3,162 (H) 03/28/2024 05:37 AM   -Dry weight: 238 pounds    Ischemic cardiomyopathy:  The current regimen includes below:  Does not have ICD therapy given newly reduced EF.  Sent on LifeVest December 2025    GDMT Current Dose Changes made at visit   BB Carvedilol  37.5 mg twice daily Verified he adjusted this today   ACEI/ARB/ARNI Entresto  97-103 Continue for now, monitor renal function   Aldosterone antagonist Ongoing consideration (hypoerkalemia resolved with initiating Jardiance )    SGLT2i Jardiance  5 mg po daily   *reports hypoglycemia at 10 mg - will assess ability ongoing Reports he is taking 10 mg but not every day   Hydralazine /Nitrate Hydralazine  50  mg 3 times daily  Isordil  20 mg 3 times daily Confirmed Hydralazine  50 mg tid  Was taking Isordil  40 mg nightly (0300-0400 a.m. with Lasix  as he thought it was diuretic).    Will take Isordil  20 mg TID with hydralazine  moving forward   Ivabradine May need to consider, but would add other GDMT first if renal function allows     HRMT  Sent with LifeVest due to new EF 35% LifeVest intact no alarms, more inconsistently wearing.  He is today   Diuretics Lasix  40 mg p.o. daily Continued.  Takes at 0300-0400 each morning which is his preference   Cardiac rehab Ordered 03/26/2024 inpatient     Candidacy for Adv HF therapies CKD, PAD potentially         CAD  - s/p PCI LAD (2007, 2009)  - Plavix  75 mg daily and rosuvastatin  20 mg daily   > LHC 12/5 90% PDA stenosis-not treated due to no angina  -No anginal symptoms today.  Discussed this with patient again     Hx LV thrombus  - PTA Eliquis  5 mg twice as above  - 12/3 limited TTE noted resolution of thrombus. Given low EF, would continue [facility] indefinitely   Denies any signs and symptoms of bleeding, discussed reasoning for this medication as he reports occasional GI upset but does not hold it per pt     PAD   - s/p multiple left SFA stents (2014, 2016)  > Continue Plavix  75 mg daily and rosuvastatin  20 mg daily   -most recents ABIs stable.  Follows with Dr. Leva. Repeat scans 6 mos.   Denies claudication.     HTN  As above in GDMT table  BP Readings from Last 3 Encounters:   04/11/24 110/64   04/04/24 124/86   04/02/24 134/74     CKD  CKD - Stage 3b  - baseline Cr: 1.6-2.0  - Cr on admission: 1.70  Lab Results   Component Value Date/Time    CR 2.2 (H) 04/11/2024 10:54 AM    CR 2.2 (H) 04/02/2024 09:53 AM    CR 2.16 (H) 03/29/2024 05:53 AM   Continue to monitor.      Hyperkalemia  Poc 5.1 12/10  Poc 4.1 today 12/19  *improved with Jardiance   Consider spironolactone  at f/u dependent on kidneys    DMII  Lab Results   Component Value Date    HGBA1C 6.8 (H) 03/24/2024   GFR calculated at 36.  Patient reports this was stopped due to hypoglycemia  Blood sugar today 126 and fasting.  Continue Jardiance  5 mg daily.  Reports stable blood sugars, but had one at 39 symptomatic.  Thinks his PCP manages trulicity.  Encouraged him to reach out. I will route note.    HLD  Lab Results   Component Value Date    CHOL 82 06/26/2023    TRIG 74 06/26/2023    HDL 30 (L) 06/26/2023    LDL 42 06/26/2023    VLDL 85.1 06/26/2023    NONHDLCHOL 52 06/26/2023   Rosuvastatin  20 mg daily     IDA  Lab Results   Component Value Date    IRON  32 (L) 03/25/2024    HGB 14.8 03/29/2024    PSAT 10 (L) 03/25/2024    FERRITIN 87 03/25/2024    TIBC 323 03/25/2024   > Venofer  300 mg x 3 doses recently (12/2-12/4)    COPD  Tobacco use   - 1-1.5 ppd   Continues  to smoke       Follow up:   F/u Dr. Vincenza April 30, 2024 to establish care.   Will establish with Dr. Maylene 04/14/2024 for ICD referral that was placed during his hospitalization, although would re-check with better BP control.    I saw him in hospital follow-up and again 9 days after today 12/19.  Medication reconciliation and consistency continues to be a barrier.  We spent over 20 minutes today discussing this and the importance of antihypertensive regimen consistency to help improve his heart function.  Patient verbalizes understanding but I suspect ongoing education and extensive medication reconciliation will be necessary.  Discussed the importance of ongoing weight and blood pressure checks.  He did get batteries and has been checking since his last visit but did not record them.  Reinforced that we will be his follow-up work prior to next visits.      I have personally documented the HPI, physical exam and medical decision making.  Counseling and education: I reviewed recent lab results and trends, current medications, necessary changes in treatment, discussed heart failure signs & symptoms, and importance of logging daily weights in addition to maintaining a low sodium diet & fluid restriction.  Plan of care was discussed and patient verbalized understanding.  Please see AVS for teaching and instructions specific to this visit. Patient advised to call our office if he has any questions, clarification needed, worsening symptoms, or concerns prior to the next appointment.       Thank you for allowing me to participate in the care of this patient. If you have any questions please do not hesitate to contact our office.    Mallie Pon, APRN  [facility]  Phone: [phone] Fax: [phone]  Pager: [phone]   Collaborating Physician: Dr. Elenore Kearns       I spent 40 minutes on this encounter today including time for clinic prep/chart review, performing a medically appropriate examination and/or evaluation, and documentation in the electronic health record. Total counseling time with the patient today was approximately 20-25 minutes discussing his heart disease.  We reviewed the above treatment plan, went over risks/benefits/alternatives to the therapies, and all questions were answered to his satisfaction.          *This note was dictated using Office Manager system.  Please know this accounts for any grammatical, contextual or spelling errors.       albuterol  0.083% (PROVENTIL ) 2.5 mg /3 mL (0.083 %) nebulizer solution Inhale 3 mL solution by nebulizer as directed every 4 hours as needed. Indications: asthma attack    albuterol  sulfate (PROAIR  HFA) 90 mcg/actuation HFA aerosol inhaler Inhale one puff to two puffs by mouth into the lungs every 4 hours as needed for Wheezing (or shortness of breath). Indications: asthma attack    betamethasone dipropionate (DIPROSONE) 0.05 % topical ointment USE TO WORST ITCHY AREAS ON BODY TWICE DAILY AS NEEDED    budesonide -glycopyr-formoterol  (BREZTRI  AEROSPHERE) 160-9-4.8 mcg/actuation inhaler Inhale two puffs by mouth into the lungs twice daily.    carvediloL  (COREG ) 12.5 mg tablet Take three tablets by mouth twice daily with meals. Take with food.  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    clopiDOGreL  (PLAVIX ) 75 mg tablet Take one tablet by mouth daily. Indications: a sudden worsening of angina called acute coronary syndrome    DEXCOM  G7 SENSOR sensor device Use one each as directed daily.    dulaglutide (TRULICITY) 0.75 mg/0.5 mL injection pen Inject 0.5 mL under the skin every 7 days.    DUPIXENT PEN 300 mg/2 mL injectable PEN Inject 2 mL under the skin every 7 days.    ELIQUIS  5 mg tablet TAKE 1 TABLET (5 MG) BY MOUTH TWICE DAILY    empagliflozin  (JARDIANCE ) 10 mg tablet Take half tablet by mouth daily.    fluticasone  propionate (FLONASE) 50 mcg/actuation nasal spray, suspension Apply one spray to each nostril as directed daily.    furosemide  (LASIX ) 20 mg tablet Take two tablets by mouth every morning. Take for lower extremity swelling    gabapentin  (NEURONTIN ) 300 mg capsule Take one capsule by mouth three times daily. (Patient taking differently: Take two capsules by mouth twice daily as needed. Indications: PAIN)    hydrALAZINE  (APRESOLINE ) 50 mg tablet Take one tablet by mouth three times daily. Indications: chronic heart failure    HYDROcodone /acetaminophen  (NORCO) 7.5/325 mg tablet Take one tablet by mouth twice daily as needed for Pain. Moderate to severe pain.    isosorbide  dinitrate (ISORDIL  TITRADOSE) 20 mg tablet Take one tablet by mouth three times daily. Indications: chronic heart failure    mupirocin  (CENTANY ) 2 % topical ointment Apply  topically to affected area three times daily. Apply to the front of the nose as needed up to three times per day    Nebulizer Accessories kit Please dispense one nebulizer kit    nicotine  (NICODERM CQ ) 14 mg/day patch Apply one patch to top of skin as directed daily. Rotate patch location.  Indications: stop smoking    rosuvastatin  (CRESTOR ) 40 mg tablet Take one tablet by mouth at bedtime daily. Indications: excessive fat in the blood    sacubitriL -valsartan  (ENTRESTO ) 97-103 mg tablet Take one tablet by mouth twice daily.    tamsulosin (FLOMAX) 0.4 mg capsule Take one capsule by mouth daily. (Patient taking differently: Take one capsule by mouth daily as needed.)    tiZANidine (ZANAFLEX) 4 mg tablet Take one tablet by mouth every 6-8 hours as needed. NTE 3 doses in 24hrs.    traZODone (DESYREL) 100 mg tablet Take two tablets by mouth at bedtime daily. (Patient taking differently: Take two tablets by mouth at bedtime as needed.)           No orders of the defined types were placed in this encounter.

## 2024-04-11 ENCOUNTER — Ambulatory Visit: Admit: 2024-04-11 | Discharge: 2024-04-11 | Payer: MEDICAID

## 2024-04-11 ENCOUNTER — Ambulatory Visit: Admit: 2024-04-11 | Discharge: 2024-04-12 | Payer: MEDICARE

## 2024-04-11 ENCOUNTER — Encounter: Admit: 2024-04-11 | Discharge: 2024-04-11 | Payer: MEDICAID

## 2024-04-11 VITALS — BP 110/64 | HR 66 | Ht 70.0 in | Wt 244.2 lb

## 2024-04-11 DIAGNOSIS — F17210 Nicotine dependence, cigarettes, uncomplicated: Secondary | ICD-10-CM

## 2024-04-11 DIAGNOSIS — N1832 Stage 3b chronic kidney disease (CMS-HCC): Secondary | ICD-10-CM

## 2024-04-11 DIAGNOSIS — I428 Other cardiomyopathies: Principal | ICD-10-CM

## 2024-04-11 DIAGNOSIS — E7800 Pure hypercholesterolemia, unspecified: Secondary | ICD-10-CM

## 2024-04-11 DIAGNOSIS — I25119 Atherosclerotic heart disease of native coronary artery with unspecified angina pectoris: Secondary | ICD-10-CM

## 2024-04-11 DIAGNOSIS — J449 Chronic obstructive pulmonary disease, unspecified: Secondary | ICD-10-CM

## 2024-04-11 DIAGNOSIS — I10 Essential (primary) hypertension: Secondary | ICD-10-CM

## 2024-04-11 DIAGNOSIS — I5042 Chronic combined systolic (congestive) and diastolic (congestive) heart failure: Secondary | ICD-10-CM

## 2024-04-14 ENCOUNTER — Encounter: Admit: 2024-04-14 | Discharge: 2024-04-14 | Payer: MEDICAID

## 2024-04-15 ENCOUNTER — Encounter: Admit: 2024-04-15 | Discharge: 2024-04-15 | Payer: MEDICAID

## 2024-04-15 NOTE — Telephone Encounter [36]
 called patient to see if he would be interested in signing up for pulm rehab. Patient was and is now scheduled for his orientation for 1/13 at 1pm

## 2024-04-30 ENCOUNTER — Ambulatory Visit: Admit: 2024-04-30 | Discharge: 2024-05-01 | Payer: MEDICARE

## 2024-04-30 ENCOUNTER — Encounter: Admit: 2024-04-30 | Discharge: 2024-04-30 | Payer: MEDICARE

## 2024-04-30 VITALS — BP 102/72 | HR 75 | Ht 71.0 in | Wt 246.4 lb

## 2024-04-30 DIAGNOSIS — G4733 Obstructive sleep apnea (adult) (pediatric): Secondary | ICD-10-CM

## 2024-04-30 DIAGNOSIS — J449 Chronic obstructive pulmonary disease, unspecified: Secondary | ICD-10-CM

## 2024-04-30 DIAGNOSIS — I5023 Acute on chronic systolic (congestive) heart failure: Secondary | ICD-10-CM

## 2024-04-30 MED ORDER — BUMETANIDE 1 MG PO TAB
1 mg | ORAL_TABLET | Freq: Every day | ORAL | 3 refills | 30.00000 days | Status: AC
Start: 2024-04-30 — End: ?

## 2024-04-30 NOTE — Progress Notes [1]
 Date of Service: 04/30/2024    Patient: Sean Henderson; FMW:[id number]; DOB: 08-Aug-1955  His PCP is Sean Bring R.    Referring physician:Leondro Henderson, Sean SAUNDERS, DO         HPI / Cardiac History:     Sean Henderson is a 69 y.o. male who presents to the advanced heart failure clinic for Initial visit.     He has PMH of ICM HFrEF 35%, CAD s/p DES LAD 2007 and 2009, peripheral artery disease with prior SFA stenting, CKD, IDDM, COPD with tobacco use, prior CVA, prostate cancer status post radiation, osteoarthritis who presents as initial visit. He was seen by Mallie Pon APRN 04/11/2024.     Cardiac / relevant history:  Reportedly NICM TTE 01/2003 LVEF 25% improved to 40%  05/2005: LAD PCI  12/2007: Admission for NSTEMI, BMS mid LAD ; 2010 TTE LVEF 60%  11/2014: Admission chest pain, LHC without obstructive disease   01/2017: Admission chest pain, LHC without obstructive disease LVEF 55-60%  12/2017: Drop in EF 35% LHC diffuse moderate CAD non-obstructive medical management --> 2021 LVEF 50%  TTE 02/23/2022 LVEF 45%  09/2023: LVEF 34%  11/30-12/09/2023: Recent URI / COPD exacerbation with ADHF LVEF 35%; LHC moderate diffuse in-stent restenosis in the LAD (max 50%), 60% ostial OM1 stenosis, and 90% stenosis of the right PDA. RHC RA 8 PA 33/52 (22) PCW 15 cardiac index 2.4, 2.2    OV 04/11/2024 Mallie Pon: 2nd follow up, concerns on medication reconciliation due to significant confusion with meds    Today he presents alone.  He reports ongoing limited physical activity.  He is disabled due to both cardiac and MSK reasons with arthritis in knees and shoulder.  He lives alone able to do activities of daily living but notes shortness of breath when getting around the house.  He notes very occasional brief chest pain that lasts less than 1 minute nonradiating nonexertional last episode 3 days ago.  He feels he has abdominal distention that may be related to fluid increase, no swelling in the lower extremities.  At hospital discharge 04/02/2024 weight recorded 246 (during hospitalization 232-236 12/5 RA 8 PCW 15).  Today weight is 246.  He also notes recently feeling he is recovering from upper respiratory illness with productive cough and nasal congestion.  He reports compliance with medications without difficulty.  He was working towards smoking cessation down to 4 cigarettes/day.        Past Medical History:    Past Medical History:    Arthritis    CAD (coronary artery disease), native coronary artery    Cardiomyopathy    CKD (chronic kidney disease) stage 3, GFR 30-59 ml/min (CMS-HCC)    COPD (chronic obstructive pulmonary disease) (CMS-HCC)    Coronary artery disease    Dizziness    DM (diabetes mellitus) (CMS-HCC)    Embolism and thrombosis of unspecified artery (CMS-HCC)    Generalized headaches    HLD (hyperlipidemia)    Hypertension    Myocardial infarction (CMS-HCC)    OSA (obstructive sleep apnea)    PAD (peripheral artery disease)    Prostate cancer (CMS-HCC)    Seasonal allergic reaction    Stroke (CMS-HCC)    Tobacco abuse        Surgical History:   Procedure Laterality Date    Lt Heart Cath With Ventriculogram Left 12/08/2014    Performed by Erling Locus, MD at [facility] CATH LAB    Coronary Angiography N/A 12/08/2014    Performed  by Erling Locus, MD at [facility] CATH LAB    Percutaneous Coronary Intervention N/A 12/08/2014    Performed by Erling Locus, MD at [facility] CATH LAB    PROSTATE BIOPSY  2017    ANGIOGRAPHY CORONARY ARTERY WITH LEFT HEART CATHETERIZATION, REQUEST RADIAL APPROACH N/A 02/14/2017    Performed by Erling Locus, MD at [facility] CATH LAB    POSSIBLE PERCUTANEOUS CORONARY STENT PLACEMENT WITH ANGIOPLASTY N/A 02/14/2017    Performed by Erling Locus, MD at [facility] CATH LAB    ANGIOGRAPHY CORONARY ARTERY WITH LEFT HEART CATHETERIZATION N/A 01/10/2018    Performed by Freeman Donnice NOVAK, MD, FACC at [facility] CATH LAB    POSSIBLE PERCUTANEOUS CORONARY STENT PLACEMENT WITH ANGIOPLASTY N/A 01/10/2018    Performed by Freeman Donnice NOVAK, MD, FACC at [facility] CATH LAB    ANGIOGRAPHY CORONARY ARTERY WITH RIGHT AND LEFT HEART CATHETERIZATION N/A 03/28/2024    Performed by Lannis Maude SAILOR, MD at [facility] CATH LAB    PERCUTANEOUS CORONARY STENT PLACEMENT WITH ANGIOPLASTY N/A 03/28/2024    Performed by Lannis Maude SAILOR, MD at [facility] CATH LAB    HX HEART CATHETERIZATION          Family History:    Mother with diabetes no other known cardiovascular disease or early CAD, sudden cardiac death  He has 7 children    Social History:    Lives in [place] alone (widower)  Work currently disabled, previously hospital doctor for a [employer]  Tobacco: Current tobacco use reports 4 cigarettes/day, 50-pack-year smoking history  Etoh: Denies any alcohol use  Drug: Denies any drug use  Stimulants or other significant supplements : Denies      Medications:    Current Outpatient Medications:     albuterol  0.083% (PROVENTIL ) 2.5 mg /3 mL (0.083 %) nebulizer solution, Inhale 3 mL solution by nebulizer as directed every 4 hours as needed. Indications: asthma attack, Disp: 90 mL, Rfl: 0    albuterol  sulfate (PROAIR  HFA) 90 mcg/actuation HFA aerosol inhaler, Inhale one puff to two puffs by mouth into the lungs every 4 hours as needed for Wheezing (or shortness of breath). Indications: asthma attack, Disp: 6.7 g, Rfl: 0    betamethasone dipropionate (DIPROSONE) 0.05 % topical ointment, USE TO WORST ITCHY AREAS ON BODY TWICE DAILY AS NEEDED, Disp: , Rfl:     budesonide -glycopyr-formoterol  (BREZTRI  AEROSPHERE) 160-9-4.8 mcg/actuation inhaler, Inhale two puffs by mouth into the lungs twice daily., Disp: 10.7 g, Rfl: 11    carvediloL  (COREG ) 12.5 mg tablet, Take three tablets by mouth twice daily with meals. Take with food.  Indications: heart failure with reduced ejection fraction due to dilated cardiomyopathy, Disp: 540 tablet, Rfl: 0    cetirizine (ZYRTEC) 10 mg tablet, Take one tablet by mouth daily., Disp: , Rfl:     clopiDOGreL  (PLAVIX ) 75 mg tablet, Take one tablet by mouth daily. Indications: a sudden worsening of angina called acute coronary syndrome, Disp: 90 tablet, Rfl: 1    DEXCOM G7 SENSOR sensor device, Use one each as directed daily., Disp: , Rfl:     dulaglutide (TRULICITY) 0.75 mg/0.5 mL injection pen, Inject 0.5 mL under the skin every 7 days., Disp: , Rfl:     DUPIXENT PEN 300 mg/2 mL injectable PEN, Inject 2 mL under the skin every 7 days., Disp: , Rfl:     ELIQUIS  5 mg tablet, TAKE 1 TABLET (5 MG) BY MOUTH TWICE DAILY, Disp: , Rfl:     empagliflozin  (JARDIANCE ) 10 mg tablet, Take half  tablet by mouth daily., Disp: 45 tablet, Rfl: 3    fluticasone  propionate (FLONASE) 50 mcg/actuation nasal spray, suspension, Apply one spray to each nostril as directed daily., Disp: , Rfl:     furosemide  (LASIX ) 20 mg tablet, Take two tablets by mouth every morning. Take for lower extremity swelling, Disp: , Rfl:     gabapentin  (NEURONTIN ) 300 mg capsule, Take one capsule by mouth three times daily. (Patient taking differently: Take two capsules by mouth twice daily as needed. Indications: PAIN), Disp: , Rfl:     hydrALAZINE  (APRESOLINE ) 50 mg tablet, Take one tablet by mouth three times daily. Indications: chronic heart failure, Disp: 270 tablet, Rfl: 0    HYDROcodone /acetaminophen  (NORCO) 7.5/325 mg tablet, Take one tablet by mouth twice daily as needed for Pain. Moderate to severe pain., Disp: , Rfl:     isosorbide  dinitrate (ISORDIL  TITRADOSE) 20 mg tablet, Take one tablet by mouth three times daily. Indications: chronic heart failure, Disp: 270 tablet, Rfl: 0    mupirocin  (CENTANY ) 2 % topical ointment, Apply  topically to affected area three times daily. Apply to the front of the nose as needed up to three times per day, Disp: 22 g, Rfl: 1    Nebulizer Accessories kit, Please dispense one nebulizer kit, Disp: 1 kit, Rfl: 0    nicotine  (NICODERM CQ ) 14 mg/day patch, Apply one patch to top of skin as directed daily. Rotate patch location.  Indications: stop smoking, Disp: 28 patch, Rfl: 0    rosuvastatin  (CRESTOR ) 40 mg tablet, Take one tablet by mouth at bedtime daily. Indications: excessive fat in the blood, Disp: 90 tablet, Rfl: 1    sacubitriL -valsartan  (ENTRESTO ) 97-103 mg tablet, Take one tablet by mouth twice daily., Disp: 180 tablet, Rfl: 3    tamsulosin (FLOMAX) 0.4 mg capsule, Take one capsule by mouth daily. (Patient taking differently: Take one capsule by mouth daily as needed.), Disp: , Rfl:     tiZANidine (ZANAFLEX) 4 mg tablet, Take one tablet by mouth every 6-8 hours as needed. NTE 3 doses in 24hrs., Disp: , Rfl:     traZODone (DESYREL) 100 mg tablet, Take two tablets by mouth at bedtime daily. (Patient taking differently: Take two tablets by mouth at bedtime as needed.), Disp: , Rfl:     Allergies: Allergies[1]    Objective:  BP 102/72 (BP Source: Arm, Right Upper, Patient Position: Sitting)  - Pulse 75  - Ht 180.3 cm (5' 11)  - Wt 111.8 kg (246 lb 6.4 oz)  - SpO2 95%  - BMI 34.37 kg/m?   BP Readings from Last 3 Encounters:   04/30/24 102/72   04/11/24 110/64   04/04/24 124/86      Pulse Readings from Last 3 Encounters:   04/30/24 75   04/11/24 66   04/02/24 80     Wt Readings from Last 8 Encounters:   04/30/24 111.8 kg (246 lb 6.4 oz)   04/11/24 110.8 kg (244 lb 3.2 oz)   04/04/24 111.6 kg (246 lb)   04/02/24 111.6 kg (246 lb)   04/02/24 111.6 kg (246 lb)   03/29/24 108.3 kg (238 lb 12.8 oz)   03/11/24 111.2 kg (245 lb 2.4 oz)   03/11/24 111.2 kg (245 lb 3.2 oz)       Physical Exam   General: Well appearing, no acute distress   Head/EYES/ENT: atramatic, normal, sclera are not icteric   Cardiovascular:  Normal rate, regular, no murmurs, JVP difficult to assess due to body habitus  however HJR likely elevated,  no edema   Lungs: non-labored respiratory pattern, lungs with clear without rales or wheezing   Abdomen: soft, distended non tender   Extremities: warm, perfused, no clubbing or cyanosis  Neurological: grossly intact, no focal deficits, moves all extremities equally   Psych:  Appropriate mood, oriented  Skin: warm, dry    Labs Reviewed:  CBC w/Diff    Lab Results   Component Value Date/Time    WBC 11.20 (H) 03/29/2024 05:53 AM    RBC 5.50 03/29/2024 05:53 AM    HGB 14.8 03/29/2024 05:53 AM    HCT 45.6 03/29/2024 05:53 AM    MCV 83.0 03/29/2024 05:53 AM    MCH 26.8 03/29/2024 05:53 AM    MCHC 32.3 03/29/2024 05:53 AM    RDW 17.3 (H) 03/29/2024 05:53 AM    PLTCT 267 03/29/2024 05:53 AM    MPV 8.3 03/29/2024 05:53 AM    Lab Results   Component Value Date/Time    NEUT 87.2 (H) 03/24/2024 12:08 AM    ANC 6.10 03/24/2024 12:08 AM    LYMA 6.8 (L) 03/24/2024 12:08 AM    ALC 0.50 (L) 03/24/2024 12:08 AM    MONA 4.7 03/24/2024 12:08 AM    AMC 0.30 03/24/2024 12:08 AM    EOSA 0.3 03/24/2024 12:08 AM    AEC 0.00 03/24/2024 12:08 AM    BASA 1.0 03/24/2024 12:08 AM    ABC 0.10 03/24/2024 12:08 AM        Comprehensive Metabolic Profile    Lab Results   Component Value Date/Time    NA 140 03/29/2024 05:53 AM    K 4.5 03/29/2024 05:53 AM    K 4.9 03/28/2024 05:37 AM    K 4.4 03/27/2024 06:02 AM    K 4.0 03/26/2024 05:44 AM    CL 103 03/29/2024 05:53 AM    CO2 24 03/29/2024 05:53 AM    GAP 13 (H) 03/29/2024 05:53 AM    BUN 54 (H) 03/29/2024 05:53 AM    CR 2.2 (H) 04/11/2024 10:54 AM    CR 2.2 (H) 04/02/2024 09:53 AM    CR 2.16 (H) 03/29/2024 05:53 AM    CR 2.16 (H) 03/28/2024 05:37 AM    GLU 129 (H) 03/29/2024 05:53 AM    Lab Results   Component Value Date/Time    CA 9.7 03/29/2024 05:53 AM    PO4 2.7 03/24/2024 05:07 AM    ALBUMIN 4.3 03/29/2024 05:53 AM    TOTPROT 7.3 03/29/2024 05:53 AM    ALKPHOS 83 03/29/2024 05:53 AM    AST 10 03/29/2024 05:53 AM    ALT 6 (L) 03/29/2024 05:53 AM    TOTBILI 0.4 03/29/2024 05:53 AM    GFR 32 (L) 03/29/2024 05:53 AM    GFRAA >60 08/18/2019 02:00 PM        Cardiac Enzymes Lipids   Lab Results   Component Value Date/Time    BNP 154.0 (H) 08/22/2021 03:30 PM    BNP 98.0 08/18/2019 02:00 PM    BNP 201.0 (H) 03/07/2019 02:07 PM    TNI 0.03 05/11/2017 09:35 AM    TNI 0.02 02/09/2017 04:17 PM TNI <0.01 04/24/2011 09:50 PM    Lab Results   Component Value Date    CHOL 82 06/26/2023    TRIG 74 06/26/2023    HDL 30 (L) 06/26/2023    LDL 42 06/26/2023    VLDL 85.1 06/26/2023    NONHDLCHOL 52 06/26/2023  Coagulation Endocrine   Lab Results   Component Value Date/Time    INR 1.1 05/11/2017 09:35 AM    INR 1.0 01/09/2013 02:15 PM    INR 1.1 12/07/2007 06:58 AM    PT 11.8 06/15/2005 05:44 AM    Lab Results   Component Value Date/Time    HGBA1C 6.8 (H) 03/24/2024 05:07 AM    HGBA1C 7.4 (H) 06/26/2023 03:32 PM    HGBA1C 7.3 (H) 05/11/2017 02:55 AM    TSH 1.65 02/13/2024 05:10 PM             Imaging and procedures reviewed:    TTE 03/24/2024: personally reveiwed LVEF ~30-35% no thrombus     The left ventricular systolic function is moderately reduced. The visually estimated ejection fraction is 35%.    The LV is dilated, global hypokinesis.    No intracardiac thrombus.    Grade I (mild) left ventricular diastolic dysfunction. Cannot determine left atrial pressure.    The right ventricular size is normal. The right ventricular systolic function is normal.    This was a 2D echo study only, cardiac valve structures were not fully evaluated.    TTE 10/04/2023: LVEF 34%  TTE 02/23/2022 LVEF 45%  TTE 2021 LVEF 50%  TTE 2004 LVEF 25%      Right heart cath 03/28/2024:  RHC RA 8 PA 33/52 (22) PCW 15 cardiac index 2.4, 2.2  LHC 03/28/2024:  IMPRESSIONS:    Moderate nonobstructive coronary artery disease as described above.  Prior stent in the proximal to mid left anterior descending with a diffuse moderate in-stent restenosis, maximum 50%.  Right PDA has a 90% stenosis which is a medium caliber vessel.  RCA has a moderate diffuse disease throughout the course.     Other procedures:  Left heart cath 06/12/2005: PCI to LAD    Assessment/Plan:  Ervine Witucki is a 69 y.o. male with     Ischemic cardiomyopathy HFrEF EF 35% NYHA class III ACC/AHA stage C  Coronary artery disease status post multiple DES, medically manage PDA  Hypertension   Refer artery disease status post SFA intervention  LV thrombus  Chronic anticoagulation  Prior CVA  CKD stage IV  Type 2 diabetes (A1c 6.8)  Current tobacco use  Prostate cancer s/p radiation    Chronic HFrEF 1) Etiology ischemic cardiomyopathy, LVEF 35%, LVIDD 6.5 cm, NYHA Class III  -- Last visit significant concern for medication compliance and confusion with medications that seems to be sorted out and reports compliance.  Blood pressure is also better controlled today 102/72.  Note 10 pound weight gain since prior right heart catheterization although weight documented at hospitalization discharge was similar to today's weight.  Volume status difficult to assess due to body habitus although appears to have positive hepatojugular reflex  -- He has had waxing waning EF since 2004  HF Therapy:    BB: Carvedilol  37.5 mg twice daily    ACE-I/ARB: Sacubitril /valsartan  97-103 mg twice daily    MRA: Prior hyperkalemia eGFR borderline    SGLT2i: Jardiance  10 mg daily    Hydral/Isord: Hydralazine  50 mg 3 times daily Isordil  20 mg 3 times daily    CRT-D: Not indicated, QRS 92 ms    ICD: wearing lifevest pending repeat TTE     Diuretic: Furosemide  20 mg daily  Cardiac rehab: has appt next week   Iron : received IV iron  in hospital 03/2024   Medication changes:  -- Will switch furosemide  to Bumex  1 mg daily for  improved GI absorption  -- Follow-up BMP and NT proBNP, may consider spironolactone  pending renal function and potassium  -- Will repeat TTE, note longstanding history of cardiomyopathy and dilated LV will likely pursue ICD.  He is wearing LifeVest today.  He notes he was sick with upper respiratory illness at time of scheduled visit with Dr. Maron    Hypertension  -- Well-controlled today continue above regimen    Coronary artery disease  -- Prior PCI to LAD 2007, 2009  -- Medical management of 90% PDA without anginal symptoms  -- Continue aggressive risk factor modification LDL goal less than 70    LV thrombus  -- Planning lifelong DOAC given reduced EF and prior CVA  -- May reconsider clopidogrel  pending interventional follow-up; at this time no bleeding concerns    Peripheral artery disease  - s/p multiple left SFA stents (2014, 2016)  - Continue Plavix  75 mg daily and rosuvastatin  20 mg daily   -most recents ABIs stable.  Follows with Dr. Leva. Repeat scans 6 mos.   Denies claudication.      Return to heart failure clinic: in 6 weeks     Thank you for allowing me to participate in the care of this patient.  Please do not hesitate to contact me should you have any questions or concerns.       Total Time Today was 60 minutes in the following activities: Preparing to see the patient, Obtaining and/or reviewing separately obtained history, Performing a medically appropriate examination and/or evaluation, Counseling and educating the patient/family/caregiver, Ordering medications, tests, or procedures, Referring and communication with other health care professionals (when not separately reported), Documenting clinical information in the electronic or other health record, Independently interpreting results (not separately reported) and communicating results to the patient/family/caregiver, and Care coordination (not separately reported)    Sean Archer, DO  Advanced Heart Failure and Heart Transplant Cardiologist  [facility]  [facility]          [1]   Allergies  Allergen Reactions    Contrast Dye Iv, Iodine Containing [Iodinated Contrast Media] HIVES    Isovue-128 [Iopamidol] HIVES     Returned to CICU post cardiac cath with hives on 12/09/2007 @ 1700    Nitroglycerin  HEADACHE     migraines

## 2024-05-01 DIAGNOSIS — R0602 Shortness of breath: Secondary | ICD-10-CM

## 2024-05-01 DIAGNOSIS — I428 Other cardiomyopathies: Principal | ICD-10-CM

## 2024-05-01 DIAGNOSIS — I5042 Chronic combined systolic (congestive) and diastolic (congestive) heart failure: Secondary | ICD-10-CM

## 2024-05-02 ENCOUNTER — Encounter: Admit: 2024-05-02 | Discharge: 2024-05-02 | Payer: MEDICARE

## 2024-05-02 NOTE — Telephone Encounter [36]
 Life vest has not connected or downloaded since 04/21/24 . Attempted to reach patient regarding no connection or download. voicemail for pt to reconnect to hot spot. [facility] office number P# [phone] was left for the pt to follow up. [employer] P# [phone] was left for trouble shooting issues.

## 2024-05-05 ENCOUNTER — Ambulatory Visit: Admit: 2024-05-05 | Discharge: 2024-05-06 | Payer: MEDICARE

## 2024-05-05 ENCOUNTER — Encounter: Admit: 2024-05-05 | Discharge: 2024-05-05 | Payer: MEDICARE

## 2024-05-05 NOTE — Telephone Encounter [36]
 attempted to call patient as a reminder for tomorrow 1/103 orientation at 1pm. Patients phone line was ot accepting calls at the moment.

## 2024-05-06 ENCOUNTER — Encounter: Admit: 2024-05-06 | Discharge: 2024-05-06 | Payer: MEDICARE

## 2024-05-06 ENCOUNTER — Emergency Department: Admit: 2024-05-06 | Discharge: 2024-05-06 | Payer: MEDICARE

## 2024-05-06 ENCOUNTER — Inpatient Hospital Stay
Admission: EM | Admit: 2024-05-06 | Discharge: 2024-05-11 | Disposition: A | Discharge status: Home Health | Payer: MEDICARE

## 2024-05-06 VITALS — BP 105/49 | HR 73 | Temp 97.40000°F | Ht 71.0 in | Wt 247.4 lb

## 2024-05-06 DIAGNOSIS — I5023 Acute on chronic systolic (congestive) heart failure: Secondary | ICD-10-CM

## 2024-05-06 DIAGNOSIS — N1832 Stage 3b chronic kidney disease (CMS-HCC): Secondary | ICD-10-CM

## 2024-05-06 DIAGNOSIS — I10 Essential (primary) hypertension: Secondary | ICD-10-CM

## 2024-05-06 DIAGNOSIS — J449 Chronic obstructive pulmonary disease, unspecified: Secondary | ICD-10-CM

## 2024-05-06 DIAGNOSIS — R0602 Shortness of breath: Secondary | ICD-10-CM

## 2024-05-06 MED ORDER — IPRATROPIUM BROMIDE 0.02 % IN SOLN
.5 mg | Freq: Once | RESPIRATORY_TRACT | 0 refills | Status: CP
Start: 2024-05-06 — End: ?
  Administered 2024-05-07: 03:00:00 0.5 mg via RESPIRATORY_TRACT

## 2024-05-06 MED ORDER — IPRATROPIUM-ALBUTEROL 0.5 MG-3 MG(2.5 MG BASE)/3 ML IN NEBU
3 mL | Freq: Once | RESPIRATORY_TRACT | 0 refills | Status: CP
Start: 2024-05-06 — End: ?
  Administered 2024-05-07: 01:00:00 3 mL via RESPIRATORY_TRACT

## 2024-05-06 MED ORDER — ALBUTEROL SULFATE 2.5 MG/0.5 ML IN NEBU
15 mg | Freq: Once | RESPIRATORY_TRACT | 0 refills | Status: CP
Start: 2024-05-06 — End: ?
  Administered 2024-05-07: 05:00:00 15 mg via RESPIRATORY_TRACT

## 2024-05-06 MED ORDER — ALBUTEROL SULFATE 2.5 MG/0.5 ML IN NEBU
15 mg | Freq: Once | RESPIRATORY_TRACT | 0 refills | Status: CP
Start: 2024-05-06 — End: ?
  Administered 2024-05-07: 03:00:00 15 mg via RESPIRATORY_TRACT

## 2024-05-06 MED ORDER — BENZONATATE 100 MG PO CAP
200 mg | Freq: Once | ORAL | 0 refills | Status: CP
Start: 2024-05-06 — End: ?
  Administered 2024-05-07: 03:00:00 200 mg via ORAL

## 2024-05-06 MED ORDER — AMOXICILLIN-POT CLAVULANATE 875-125 MG PO TAB
875 mg | Freq: Once | ORAL | 0 refills | Status: CP
Start: 2024-05-06 — End: ?
  Administered 2024-05-07: 02:00:00 875 mg via ORAL

## 2024-05-06 MED ORDER — AZITHROMYCIN 250 MG PO TAB
500 mg | Freq: Once | ORAL | 0 refills | Status: CP
Start: 2024-05-06 — End: ?
  Administered 2024-05-07: 02:00:00 500 mg via ORAL

## 2024-05-06 MED ORDER — IPRATROPIUM BROMIDE 0.02 % IN SOLN
.5 mg | Freq: Once | RESPIRATORY_TRACT | 0 refills | Status: CP
Start: 2024-05-06 — End: ?
  Administered 2024-05-07: 05:00:00 0.5 mg via RESPIRATORY_TRACT

## 2024-05-06 MED ORDER — PREDNISONE 20 MG PO TAB
60 mg | Freq: Once | ORAL | 0 refills | Status: CP
Start: 2024-05-06 — End: ?
  Administered 2024-05-07: 01:00:00 60 mg via ORAL

## 2024-05-06 NOTE — H&P [4]
 Internal Medicine History and Physical      Patient's Name:  Tiago Humphrey MRN: [id number]   Today's Date:  05/07/2024  Admission Date: 05/06/2024  LOS: 1 day    Problem list  Principal Problem:    COPD exacerbation (CMS-HCC)      History of Present Illness:     Chief Complaint:  Cough and shortness of breath    Westen Dinino is a 69 y.o. man with a pertinent PMH of COPD, OSA, current tobacco use, CAD, CHF, HTN, HLD who was admitted on 05/06/2024 for COPD exacerbation.    Patient reports 5 day history of worsening cough productive of clear sputum, wheezing, and shortness of breath associated with chest/abdominal pain during coughing episodes, nausea, nasal congestion, fatigue, and decreased appetite. When asked about sick contacts, he said he recently went to see his doctor who was coughing and had his whole family with the flu. He says he is compliant with his inhalers and breathing treatments, only getting temporary relief with his albuterol  inhaler. He also says he is compliant with his heart failure medications. He has been taking Lasix  because he has not been able to pick up his new Bumex  prescription yet. He does not wear O2 at home, but he is compliant with nightly CPAP.    Patient endorses no fever, sweats, chills, headache, dizziness, vomiting, abdominal bloating, extremity swelling, urinary issues, issues with bowel movements.      Pertinent ER/OSH workup:  - 1/13 CXR - mild L basilar atelectasis/infiltrate, no consolidation or effusion, consider f/u PA and lateral CXR  - Trop 15.8 > 18.6 > 42.8 (h)  - Unremarkable/negative: CBC, Flu, RSV, COVID    ER/OSH interventions:   - Augmentin  x1  - Azithromycin  x1  - Prednisone  60mg  PO x1  - Ipratropium bromide  neb x2  - Albuterol  neb x2  - Duoneb x1      Assessment and Plan:     Lauro Manlove is a 69 y.o. man with a pertinent PMH of COPD, OSA, current tobacco use, CAD, CHF, HTN, HLD who was admitted on 05/06/2024 for COPD exacerbation.     #COPD exacerbation  PLAN:  > Albuterol  neb q4h prn  > Prednisone  40mg  - for 5d course of steroids  > Azithromycin  250mg  - for 5d course of abx  > Supplemental O2 as needed    #CHF  #CAD  #HTN  #HLD  - Per chart review, dry weight 238 lb  - 1/14 admission wt 239 lb  - 1/13 trop 15.8 > 18.6 > 42.8 (h)  PLAN:  > Trend troponin until peak  > Bumex  2mg  IV x1 - reassess in AM  > Continue PTA rosuvastatin , carvedilol   > Cardiac diet with 2L/d fluid restriction    #OSA  PLAN:  > CPAP nightly    #CKD3b  - Per chart review, baseline Cr 1.6-2.0  - 1/13 admission Cr 1.95  PLAN:  > CTM daily CMP    #Tobacco use  PLAN:  > Nicotine  patch    Diet: DIET CARDIAC(LOW FAT/LOW SODIUM)  VTE ppx: Enoxaparin   CODE: Full Code    DISPO: Admit to medicine.    Patient was discussed with Dr. Claudie.    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    Social history:  Social History     Tobacco Use    Smoking status: Some Days     Current packs/day: 5.00     Average packs/day: 5.0 packs/day for 52.0 years (260.2 ttl pk-yrs)     Types: Cigarettes     Start date: 04/24/1972    Smokeless tobacco: Never    Tobacco comments:     Patient states he had somked up to 5 ppd at his peak      *09/30/2021 5 cigarettes daily Substance Use Topics    Alcohol use: Not Currently    Drug use: Not Currently     Frequency: 2.0 times per week     Types: Marijuana     Comment: Smokes marijuana 1-2x/week at bedtime.Quit cocaine 1991       Past surgical history:  The patient  has a past surgical history that includes heart catheterization; Prostate biopsy (2017); and percutaneous coronary intervention (N/A, 12/08/2014).    Family History:  family history includes Diabetes in his mother; Heart Attack in his brother, father, mother, and sister; Hypertension in his brother, father, mother, and sister.    Objective:   Vital Signs:  BP (!) 142/80 (BP Source: Arm, Right Upper)  - Pulse 93  - Temp 37 ?C (98.6 ?F)  - Wt 108.8 kg (239 lb 13.8 oz)  - SpO2 95%  - BMI 33.45 kg/m?     Physical Exam  Constitutional:       General: He is not in acute distress.     Appearance: He is obese. He is ill-appearing.   HENT:      Head: Normocephalic and atraumatic.      Right Ear: External ear normal.      Left Ear: External ear normal.      Nose: Nose normal.      Mouth/Throat:      Pharynx: Oropharynx is clear.   Eyes:      Extraocular Movements: Extraocular movements intact.      Conjunctiva/sclera: Conjunctivae normal.   Cardiovascular:      Rate and Rhythm: Normal rate and regular rhythm.      Pulses: Normal pulses.      Heart sounds: Normal heart sounds.   Pulmonary:      Effort: No respiratory distress.      Breath sounds: Wheezing present.      Comments: Coughing on exam  Abdominal:      General: There is distension (mild).      Palpations: Abdomen is soft.      Tenderness: There is no abdominal tenderness. There is no right CVA tenderness or left CVA tenderness.   Musculoskeletal:         General: No swelling.      Right lower leg: No edema.      Left lower leg: No edema.   Skin:     General: Skin is warm and dry.      Capillary Refill: Capillary refill takes less than 2 seconds.   Neurological:      General: No focal deficit present.      Mental Status:  He is alert and oriented to person, place, and time. Mental status is at baseline.   Psychiatric:         Mood and Affect: Mood normal.         Behavior: Behavior normal.         Thought Content: Thought content normal.          Labs:  Recent Labs     05/06/24  1755 05/06/24  1950   HGB 15.0  --    HCT 46.0  --    WBC 4.70  --    PLTCT 179  --    NA  --  144   K  --  4.0   CL  --  107   CO2  --  25   BUN  --  26*   CR  --  1.95*   GLU  --  100   CA  --  8.9   ALBUMIN  --  4.1   TOTPROT  --  6.9   TOTBILI  --  0.5   AST  --  12   ALT  --  3*   ALKPHOS  --  82   Glucose: 100 (05/06/24 1950)    Pertinent Radiology Reviewed.     Meds:  Scheduled Meds:azithromycin  (ZITHROMAX ) tablet 250 mg, 250 mg, Oral, QDAY  bumetanide  (BUMEX ) injection 2 mg, 2 mg, Intravenous, ONCE  carvedilol  (COREG ) tablet 37.5 mg, 37.5 mg, Oral, BID w/meals  enoxaparin  (LOVENOX ) syringe 40 mg, 40 mg, Subcutaneous, QDAY(21)  nicotine  (NICODERM CQ ) 7 mg/day patch 1 patch, 1 patch, Transdermal, QDAY  predniSONE  (DELTASONE ) tablet 40 mg, 40 mg, Oral, QDAY w/breakfast  rosuvastatin  (CRESTOR ) tablet 40 mg, 40 mg, Oral, QHS    Continuous Infusions:  PRN and Respiratory Meds:albuterol  0.083% Q4H PRN, melatonin QHS PRN, ondansetron  Q6H PRN **OR** ondansetron  Q6H PRN, polyethylene glycol 3350  QDAY PRN, sennosides-docusate sodium  QDAY PRN

## 2024-05-06 NOTE — ED Notes [6]
 ED Initial Provider Note:    This patient was seen in the ED triage area to initiate and expedite the patients ED care when possible.    ED Chief Complaint:   Chief Complaint   Patient presents with    Shortness of Breath     X5 days. Has life vest but did not put it on today. Waiting for internal defib placement. Pt breathing is labored. 96% RA.        S: Sean Henderson is a 69 y.o. male who presents to the Emergency Department for complaints of worsening shortness of breath for the past 5 days.  Patient has a large cardiac history and is supposed be wearing a LifeVest but did not put on today and does not currently have it with him in the emergency department.    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   O: Brief Physical: Pt is up, awake and alert and able to provide linear hx pertaining to their ED visit today. Pt has non labored respirations with symmetrical chest rise and fall. Pt in no obvious distress.       A/P: The patient was seen by me as an initial provider in triage. A brief history and physical was obtained. My exam is intended to be an initial medial screening exam. Initial orders have been placed by me. My working diagnosis is heart failure, ACS, viral illness, pneumonia.    The patient is deemed appropriate for the main ED. The patient's care will be resumed by the ED provider care team once the patient is roomed in the ED. A more detailed / complete H&P will be documented by those providers.

## 2024-05-06 NOTE — ED Notes [6]
 69 year old male presents to ER for nasal congestion, cough, malaise, shortness of breath when coughing. He states he has been feeling like this for five days. He has lots of mucus production, he is actively coughing. He denies fevers at home. Patient is supposed to wear a life vest, he is not.

## 2024-05-07 LAB — CBC AND DIFF
~~LOC~~ BKR ABSOLUTE BASO COUNT: 0.1 10*3/uL (ref 0.00–0.20)
~~LOC~~ BKR ABSOLUTE BASO COUNT: 0 10*3/uL (ref 0.00–0.20)
~~LOC~~ BKR ABSOLUTE EOS COUNT: 0.2 10*3/uL (ref 0.00–0.45)
~~LOC~~ BKR ABSOLUTE EOS COUNT: 0 10*3/uL (ref 0.00–0.45)
~~LOC~~ BKR ABSOLUTE LYMPH COUNT: 0.5 10*3/uL — ABNORMAL LOW (ref 1.00–4.80)
~~LOC~~ BKR ABSOLUTE MONO COUNT: 0.6 10*3/uL (ref 0.00–0.80)
~~LOC~~ BKR ABSOLUTE MONO COUNT: 0.2 10*3/uL (ref 0.00–0.80)
~~LOC~~ BKR ABSOLUTE NEUTROPHIL: 3.9 10*3/uL (ref 1.80–7.00)
~~LOC~~ BKR BASOPHILS %: 0.4 % (ref 0.0–2.0)
~~LOC~~ BKR EOSINOPHILS %: 0 % (ref 0.0–5.0)
~~LOC~~ BKR HEMATOCRIT: 46 % (ref 40.0–50.0)
~~LOC~~ BKR HEMATOCRIT: 43 % (ref 40.0–50.0)
~~LOC~~ BKR HEMOGLOBIN: 13 g/dL (ref 13.5–16.5)
~~LOC~~ BKR LYMPHOCYTES %: 10 % — ABNORMAL LOW (ref 24.0–44.0)
~~LOC~~ BKR MCH: 27 pg — ABNORMAL HIGH (ref 26.0–34.0)
~~LOC~~ BKR MCH: 26 pg (ref 26.0–34.0)
~~LOC~~ BKR MCHC: 32 g/dL — ABNORMAL HIGH (ref 32.0–36.0)
~~LOC~~ BKR MCHC: 32 g/dL (ref 32.0–36.0)
~~LOC~~ BKR MCV: 83 fL (ref 80.0–100.0)
~~LOC~~ BKR MCV: 82 fL (ref 80.0–100.0)
~~LOC~~ BKR MDW (MONOCYTE DISTRIBUTION WIDTH): 21 — ABNORMAL HIGH (ref <=20.6)
~~LOC~~ BKR MONOCYTES %: 3.7 % — ABNORMAL LOW (ref 4.0–12.0)
~~LOC~~ BKR MPV: 7.9 fL (ref 7.0–11.0)
~~LOC~~ BKR MPV: 8 fL (ref 7.0–11.0)
~~LOC~~ BKR NEUTROPHILS %: 85 % — ABNORMAL HIGH (ref 41.0–77.0)
~~LOC~~ BKR PLATELET COUNT: 217 10*3/uL (ref 150–400)
~~LOC~~ BKR RBC COUNT: 5.5 10*6/uL — ABNORMAL HIGH (ref 4.40–5.50)
~~LOC~~ BKR RBC COUNT: 5.2 10*6/uL (ref 4.40–5.50)
~~LOC~~ BKR RDW: 17 % — ABNORMAL HIGH (ref 11.0–15.0)
~~LOC~~ BKR RDW: 17 % — ABNORMAL HIGH (ref 11.0–15.0)
~~LOC~~ BKR WBC COUNT: 4.7 10*3/uL (ref 4.50–11.00)
~~LOC~~ BKR WBC COUNT: 4.5 10*3/uL (ref 4.50–11.00)

## 2024-05-07 LAB — BLOOD GASES, PERIPHERAL VENOUS
~~LOC~~ BKR BASE EXCESS-VENOUS: 0.3 mmol/L
~~LOC~~ BKR BICARB, VENOUS(CAL): 24 mmol/L
~~LOC~~ BKR O2 SAT, VENOUS: 83 % — ABNORMAL HIGH (ref 55.0–71.0)
~~LOC~~ BKR PCO2-VENOUS: 45 mmHg (ref 36–50)
~~LOC~~ BKR PH-VENOUS: 7.3 (ref 7.30–7.40)
~~LOC~~ BKR PO2-VENOUS: 47 mmHg (ref 33–48)

## 2024-05-07 LAB — RVP VIRAL PANEL PCR

## 2024-05-07 LAB — COMPREHENSIVE METABOLIC PANEL
~~LOC~~ BKR ALBUMIN: 4.1 g/dL (ref 3.5–5.0)
~~LOC~~ BKR ALBUMIN: 4.1 g/dL (ref 3.5–5.0)
~~LOC~~ BKR ALK PHOSPHATASE: 82 U/L (ref 25–110)
~~LOC~~ BKR ALK PHOSPHATASE: 78 U/L (ref 25–110)
~~LOC~~ BKR ALT: 3 U/L — ABNORMAL LOW (ref 7–56)
~~LOC~~ BKR ALT: 6 U/L — ABNORMAL LOW (ref 7–56)
~~LOC~~ BKR ANION GAP: 12 10*3/uL (ref 3–12)
~~LOC~~ BKR ANION GAP: 11 (ref 3–12)
~~LOC~~ BKR AST: 12 U/L (ref 7–40)
~~LOC~~ BKR AST: 12 U/L (ref 7–40)
~~LOC~~ BKR BLD UREA NITROGEN: 34 mg/dL — ABNORMAL HIGH (ref 7–25)
~~LOC~~ BKR CALCIUM: 9.2 mg/dL (ref 8.5–10.6)
~~LOC~~ BKR CHLORIDE: 105 mmol/L (ref 98–110)
~~LOC~~ BKR CO2: 25 mmol/L (ref 21–30)
~~LOC~~ BKR CO2: 26 mmol/L (ref 21–30)
~~LOC~~ BKR CREATININE: 2.2 mg/dL — ABNORMAL HIGH (ref 0.40–1.24)
~~LOC~~ BKR GLOMERULAR FILTRATION RATE (GFR): 37 mL/min — ABNORMAL LOW (ref >60–4.80)
~~LOC~~ BKR GLOMERULAR FILTRATION RATE (GFR): 32 mL/min — ABNORMAL LOW (ref >60)
~~LOC~~ BKR GLUCOSE, RANDOM: 215 mg/dL — ABNORMAL HIGH (ref 70–100)
~~LOC~~ BKR POTASSIUM: 4 mmol/L (ref 3.5–5.1)
~~LOC~~ BKR SODIUM, SERUM: 142 mmol/L (ref 137–147)
~~LOC~~ BKR TOTAL BILIRUBIN: 0.3 mg/dL (ref 0.2–1.3)
~~LOC~~ BKR TOTAL PROTEIN: 6.8 g/dL (ref 6.0–8.0)

## 2024-05-07 LAB — POC GLUCOSE
~~LOC~~ BKR POC GLUCOSE: 225 mg/dL — ABNORMAL HIGH (ref 70–100)
~~LOC~~ BKR POC GLUCOSE: 118 mg/dL — ABNORMAL HIGH (ref 70–100)
~~LOC~~ BKR POC GLUCOSE: 136 mg/dL — ABNORMAL HIGH (ref 70–100)
~~LOC~~ BKR POC GLUCOSE: 226 mg/dL — ABNORMAL HIGH (ref 70–100)

## 2024-05-07 LAB — HIGH SENSITIVITY TROPONIN I 0 HOUR: ~~LOC~~ BKR HIGH SENSITIVITY TROPONIN I 0 HOUR: 13 ng/L (ref <20.0)

## 2024-05-07 LAB — HIGH SENSITIVITY TROPONIN I 4 HR
~~LOC~~ BKR HI SEN TNI DELTA 4-2: 24
~~LOC~~ BKR HIGH SENSITIVITY TROPONIN I 4 HOUR: 42 ng/L — ABNORMAL HIGH (ref <20.0)

## 2024-05-07 LAB — COVID INFLUENZA A/B AND RSV PCR

## 2024-05-07 LAB — KC ED MAIN ECG TRIAGE ONLY
P AXIS: 65 degrees
P-R INTERVAL: 202 ms
Q-T INTERVAL: 442 ms
QRS DURATION: 90 ms
QTC CALCULATION (BAZETT): 467 ms
R AXIS: 1 degrees
T AXIS: 242 degrees
VENTRICULAR RATE: 67 {beats}/min

## 2024-05-07 LAB — HIGH SENSITIVITY TROPONIN I 2 HOUR
~~LOC~~ BKR HIGH SENSITIVITY TROPONIN I 2 HOUR: 18 ng/L (ref <20.0)
~~LOC~~ BKR HIGH SENSITIVITY TROPONIN I DELTA VALUE: 2.8

## 2024-05-07 LAB — NT-PRO-BNP: ~~LOC~~ BKR NT-PRO-BNP: 244 pg/mL — ABNORMAL HIGH (ref 3.5–<125)

## 2024-05-07 MED ORDER — INSULIN ASPART 100 UNIT/ML SC FLEXPEN
0-6 [IU] | Freq: Before meals | SUBCUTANEOUS | 0 refills | Status: DC
Start: 2024-05-07 — End: 2024-05-11
  Administered 2024-05-07: 19:00:00 2 [IU] via SUBCUTANEOUS

## 2024-05-07 MED ORDER — FLUTICASONE PROPIONATE 50 MCG/ACTUATION NA SPSN
1 | Freq: Every day | NASAL | 0 refills | Status: DC
Start: 2024-05-07 — End: 2024-05-11
  Administered 2024-05-07: 16:00:00 1 via NASAL

## 2024-05-07 MED ORDER — ACETAMINOPHEN 325 MG PO TAB
650 mg | ORAL | 0 refills | Status: DC | PRN
Start: 2024-05-07 — End: 2024-05-11

## 2024-05-07 MED ORDER — POLYETHYLENE GLYCOL 3350 17 GRAM PO PWPK
1 | Freq: Every day | ORAL | 0 refills | Status: DC | PRN
Start: 2024-05-07 — End: 2024-05-11

## 2024-05-07 MED ORDER — ONDANSETRON HCL (PF) 4 MG/2 ML IJ SOLN
4 mg | INTRAVENOUS | 0 refills | Status: DC | PRN
Start: 2024-05-07 — End: 2024-05-11

## 2024-05-07 MED ORDER — ROSUVASTATIN 20 MG PO TAB
40 mg | Freq: Every evening | ORAL | 0 refills | Status: DC
Start: 2024-05-07 — End: 2024-05-11
  Administered 2024-05-08 – 2024-05-11 (×4): 40 mg via ORAL

## 2024-05-07 MED ORDER — ENOXAPARIN 40 MG/0.4 ML SC SYRG
40 mg | Freq: Every day | SUBCUTANEOUS | 0 refills | Status: DC
Start: 2024-05-07 — End: 2024-05-07
  Administered 2024-05-07: 08:00:00 40 mg via SUBCUTANEOUS

## 2024-05-07 MED ORDER — NICOTINE 7 MG/24 HR TD PT24
1 | Freq: Every day | TRANSDERMAL | 0 refills | Status: DC
Start: 2024-05-07 — End: 2024-05-11
  Administered 2024-05-07 – 2024-05-11 (×5): 1 via TRANSDERMAL

## 2024-05-07 MED ORDER — TRAZODONE 100 MG PO TAB
200 mg | Freq: Every evening | ORAL | 0 refills | Status: DC | PRN
Start: 2024-05-07 — End: 2024-05-11

## 2024-05-07 MED ORDER — ONDANSETRON 4 MG PO TBDI
4 mg | ORAL | 0 refills | Status: DC | PRN
Start: 2024-05-07 — End: 2024-05-11

## 2024-05-07 MED ORDER — BUMETANIDE 0.25 MG/ML IJ SOLN
2 mg | Freq: Once | INTRAVENOUS | 0 refills | Status: CP
Start: 2024-05-07 — End: ?
  Administered 2024-05-07: 08:00:00 2 mg via INTRAVENOUS

## 2024-05-07 MED ORDER — IPRATROPIUM-ALBUTEROL 0.5 MG-3 MG(2.5 MG BASE)/3 ML IN NEBU
3 mL | RESPIRATORY_TRACT | 0 refills | Status: DC | PRN
Start: 2024-05-07 — End: 2024-05-08
  Administered 2024-05-07 – 2024-05-08 (×6): 3 mL via RESPIRATORY_TRACT

## 2024-05-07 MED ORDER — HYDRALAZINE 25 MG PO TAB
50 mg | Freq: Three times a day (TID) | ORAL | 0 refills | Status: DC
Start: 2024-05-07 — End: 2024-05-11
  Administered 2024-05-07 – 2024-05-11 (×13): 50 mg via ORAL

## 2024-05-07 MED ORDER — PREDNISONE 20 MG PO TAB
40 mg | Freq: Every day | ORAL | 0 refills | Status: CP
Start: 2024-05-07 — End: ?
  Administered 2024-05-07 – 2024-05-10 (×4): 40 mg via ORAL

## 2024-05-07 MED ORDER — CETIRIZINE 10 MG PO TAB
10 mg | Freq: Every day | ORAL | 0 refills | Status: DC
Start: 2024-05-07 — End: 2024-05-11
  Administered 2024-05-07 – 2024-05-11 (×5): 10 mg via ORAL

## 2024-05-07 MED ORDER — DEXTROSE 50 % IN WATER (D50W) IV SYRG
12.5-25 g | INTRAVENOUS | 0 refills | Status: DC | PRN
Start: 2024-05-07 — End: 2024-05-11

## 2024-05-07 MED ORDER — GABAPENTIN 300 MG PO CAP
300 mg | Freq: Three times a day (TID) | ORAL | 0 refills | Status: DC
Start: 2024-05-07 — End: 2024-05-11
  Administered 2024-05-07 – 2024-05-11 (×13): 300 mg via ORAL

## 2024-05-07 MED ORDER — ALBUTEROL SULFATE 2.5 MG /3 ML (0.083 %) IN NEBU
2.5 mg | RESPIRATORY_TRACT | 0 refills | Status: DC | PRN
Start: 2024-05-07 — End: 2024-05-07

## 2024-05-07 MED ORDER — OXYCODONE 5 MG PO TAB
5 mg | Freq: Once | ORAL | 0 refills | Status: CP
Start: 2024-05-07 — End: ?
  Administered 2024-05-08: 07:00:00 5 mg via ORAL

## 2024-05-07 MED ORDER — AZITHROMYCIN 250 MG PO TAB
250 mg | Freq: Every day | ORAL | 0 refills | Status: CP
Start: 2024-05-07 — End: ?
  Administered 2024-05-07 – 2024-05-10 (×4): 250 mg via ORAL

## 2024-05-07 MED ORDER — CARVEDILOL 12.5 MG PO TAB
37.5 mg | Freq: Two times a day (BID) | ORAL | 0 refills | Status: DC
Start: 2024-05-07 — End: 2024-05-11
  Administered 2024-05-07 – 2024-05-11 (×18): 37.5 mg via ORAL

## 2024-05-07 MED ORDER — UMECLIDINIUM-VILANTEROL 62.5-25 MCG/ACTUATION IN DSDV
1 | Freq: Every day | RESPIRATORY_TRACT | 0 refills | Status: DC
Start: 2024-05-07 — End: 2024-05-11
  Administered 2024-05-07: 16:00:00 1 via RESPIRATORY_TRACT

## 2024-05-07 MED ORDER — TIZANIDINE 4 MG PO TAB
4 mg | ORAL | 0 refills | Status: DC | PRN
Start: 2024-05-07 — End: 2024-05-11

## 2024-05-07 MED ORDER — ISOSORBIDE DINITRATE 10 MG PO TAB
20 mg | Freq: Three times a day (TID) | ORAL | 0 refills | Status: DC
Start: 2024-05-07 — End: 2024-05-11
  Administered 2024-05-07 – 2024-05-11 (×13): 20 mg via ORAL

## 2024-05-07 MED ORDER — CLOPIDOGREL 75 MG PO TAB
75 mg | Freq: Every day | ORAL | 0 refills | Status: DC
Start: 2024-05-07 — End: 2024-05-11
  Administered 2024-05-07 – 2024-05-11 (×5): 75 mg via ORAL

## 2024-05-07 MED ORDER — SACUBITRIL-VALSARTAN 97-103 MG PO TAB
1 | Freq: Two times a day (BID) | ORAL | 0 refills | Status: DC
Start: 2024-05-07 — End: 2024-05-11

## 2024-05-07 MED ORDER — TAMSULOSIN 0.4 MG PO CAP
.4 mg | Freq: Every day | ORAL | 0 refills | Status: DC
Start: 2024-05-07 — End: 2024-05-11
  Administered 2024-05-07 – 2024-05-11 (×5): 0.4 mg via ORAL

## 2024-05-07 MED ORDER — APIXABAN 5 MG PO TAB
5 mg | Freq: Two times a day (BID) | ORAL | 0 refills | Status: DC
Start: 2024-05-07 — End: 2024-05-11
  Administered 2024-05-07 – 2024-05-11 (×9): 5 mg via ORAL

## 2024-05-07 MED ORDER — FLUTICASONE FUROATE 100 MCG/ACTUATION IN DSDV
1 | Freq: Every day | RESPIRATORY_TRACT | 0 refills | Status: DC
Start: 2024-05-07 — End: 2024-05-11
  Administered 2024-05-07: 16:00:00 1 via RESPIRATORY_TRACT

## 2024-05-07 MED ORDER — SENNOSIDES-DOCUSATE SODIUM 8.6-50 MG PO TAB
1 | Freq: Every day | ORAL | 0 refills | Status: DC | PRN
Start: 2024-05-07 — End: 2024-05-11

## 2024-05-07 MED ORDER — MELATONIN 5 MG PO TAB
5 mg | Freq: Every evening | ORAL | 0 refills | Status: DC | PRN
Start: 2024-05-07 — End: 2024-05-11
  Administered 2024-05-08 – 2024-05-11 (×4): 5 mg via ORAL

## 2024-05-07 NOTE — Progress Notes [1]
 Float Pool END OF SHIFT/ JHFRAT NOTE    Admission Date: 05/06/2024    Acute events, interventions, provider communication: No     Patient Interventions and Education  Fall Risk/JHFRAT Interventions and Education: (Charting when applicable)  Elimination Interventions : Place male/male urinal within reach   Medications : Educate patient on medication side effects  Patient Care Equipment: Ensure environment is free of clutter and walkways are clear from tripping hazards  Mobility: Elimination equipment at bedside (urinal or commode)   Cognition: N/A  Risk for Moderate/Major Injury: Age: >65 yrs and Active Anticoagulation    2. Restraints:  No     Restraint Goal: Patient will be free from injury while physically restrained.  See Docflowsheet for restraint documentation, interventions, education, etc.    Intake and Output:       Intake/Output Summary (Last 24 hours) at 05/07/2024 1848  Last data filed at 05/07/2024 1100  Gross per 24 hour   Intake 600 ml   Output --   Net 600 ml              Last Bowel Movement Date:  (PTA)

## 2024-05-07 NOTE — Progress Notes [1]
 COPD Education      I visited Sean Henderson this afternoon to provide a COPD education and provided him  with information packet. Patient was familiar with his respiratory  home medication and how to use it. Patient stated that he is working on quitting smoking and planning on making an appointment  with his provider about his home medication. Patient didn't have any question for this RT at this time. This RT encourage patient to read over the packet and that another RT will follow up with him tomorrow.

## 2024-05-07 NOTE — Progress Notes [1]
 General Progress Note    Name:  Sean Henderson   Today's Date:  05/07/2024  Admission Date: 05/06/2024  LOS: 1 day                       ASSESSMENT & PLAN     Principal Problem:    COPD exacerbation (CMS-HCC)      Sean Henderson is a 69 y.o. male with PMHx significant for COPD, OSA, current tobacco use, CAD, CHF, HTN, HLD who was admitted on 05/06/2024 for COPD exacerbation.       INTERVAL UPDATES 05/07/2024  - Chest x-ray - Mild left basilar atelectasis/infiltrate. No consolidation or effusion. Consider follow-up PA and lateral chest x-ray.   - Full RVP - negative    - Obtain VBG 1/14       #COPD exacerbation  - Negative for COVID-19, Influenza A/B, and RSV   - Mild left basilar atelectasis/infiltrate. No consolidation or effusion. Consider follow-up PA and lateral chest x-ray. [Widened mediastimum??]   PLAN:  > Start Duoneb q4h prn  > Prednisone  40mg  - for 5d course of steroids  > Azithromycin  250mg  - for 5d course of abx  > Continue Fluticasone  furoate Ellipta 1 puff daily   > Supplemental O2 as needed  > Full RVP - negative   > Obtain sputum culture when able   > Encourage incentive spirometry   > Initiate LDCF insulin  given high blood glucose on prednisone         #HFrEF  #CAD  #HTN  #HLD  - Per chart review, dry weight 238 lb  - 1/14 admission wt 239 lb  - 1/13 trop 15.8 > 18.6 > 42.8 (h) -> 13.4   - 1/13 NT-pro-BNP - 2444 (similar in December)   -- 03/24/2024 - ECHO - LVEF 35%. Global hypokinesis. Grade I (mild) left ventricular diastolic dysfunction. Cannot determine left atrial pressure. The right ventricular size is normal. The right ventricular systolic function is normal. Cardiac valve structures were not fully evaluated.  -- 03/28/2024: Cardiac Cath: Moderate nonobstructive coronary artery disease. Prior stent in the proximal to mid left anterior descending with a diffuse moderate in-stent restenosis, maximum 50%. Right PDA has a 90% stenosis which is a medium caliber vessel. RCA has a moderate diffuse disease throughout the course.  PLAN:  > Trend troponin until peak  > Bumex  2mg  IV x1 - reassess in AM  > Continue PTA rosuvastatin , carvedilol   > Cardiac diet with 2L/d fluid restriction  > Continue Eliquis  5 mg BID   > Continue Plavix  75 mg daily   > Continue Entresto  97-103 mg BID   > Continue Isordil  20 mg TID   > Continue Hydralazine  50 mg TID     #OSA  PLAN:  > CPAP nightly     #CKD3b  - Per chart review, baseline Cr 1.6-2.0  - 1/13 admission Cr 1.95  PLAN:  > CTM daily CMP    #BPH  -Continue tamsulosin       #Tobacco use  PLAN:  > Nicotine  patch      FEN:   >PRN IVF  >Electrolytes reviewed. K>4, Mg>2   >DIET CARDIAC(LOW FAT/LOW SODIUM)   DVT Ppx: Lovenox   Code Status: Full Code    Dispo: Continue inpt evaluation and management of COPD exacerbation.     Pt seen and discussed with my attending, Dr. Chandra, who directed above plan of care.    Marsa Mitchell, M.D.   Internal Medicine Resident - PGY-1  Available by [facility] and Mobile paging  ________________________________________________________________________      SUBJECTIVE     Patient admitted overnight. Patient is very tired during our conversation both in the morning and in the afternoon. He is unable to stay engaged in conversation. Will obtain CPAP tonight. He denies fevers, chills, body aches, CP, SOB, n/v/d.       Medications     Scheduled Meds:azithromycin  (ZITHROMAX ) tablet 250 mg, 250 mg, Oral, QDAY  carvedilol  (COREG ) tablet 37.5 mg, 37.5 mg, Oral, BID w/meals  enoxaparin  (LOVENOX ) syringe 40 mg, 40 mg, Subcutaneous, QDAY(21)  nicotine  (NICODERM CQ ) 7 mg/day patch 1 patch, 1 patch, Transdermal, QDAY  predniSONE  (DELTASONE ) tablet 40 mg, 40 mg, Oral, QDAY w/breakfast  rosuvastatin  (CRESTOR ) tablet 40 mg, 40 mg, Oral, QHS    Continuous Infusions:  PRN and Respiratory Meds:albuterol  0.083% Q4H PRN, melatonin QHS PRN, ondansetron  Q6H PRN **OR** ondansetron  Q6H PRN, polyethylene glycol 3350  QDAY PRN, sennosides-docusate sodium  QDAY PRN      Prescriptions Prior to Admission[1]    Allergies     Allergies[2]    OBJECTIVE                            Vital Signs: Last Filed                 Vital Signs: 24 Hour Range   BP: 126/66 (01/14 0128)  Temp: 36.4 ?C (97.5 ?F) (01/14 0128)  Pulse: 72 (01/14 0128)  Respirations: 16 PER MINUTE (01/14 0128)  SpO2: 96 % (01/14 0128)  O2 Device: Nasal cannula (01/14 0128)  O2 Liter Flow: 2 Lpm (01/14 0128)  Height: 180.3 cm (5' 11) (01/14 0128) BP: (126-166)/(66-85)   Temp:  [36.4 ?C (97.5 ?F)-37 ?C (98.6 ?F)]   Pulse:  [60-93]   Respirations:  [16 PER MINUTE-32 PER MINUTE]   SpO2:  [92 %-97 %]   O2 Device: Nasal cannula  O2 Liter Flow: 2 Lpm   Intensity Pain Scale (Self Report): 6 (05/06/24 2357) Vitals:    05/06/24 2357 05/07/24 0128   Weight: 108.8 kg (239 lb 13.8 oz) 108.1 kg (238 lb 5.1 oz)           Intake/Output Summary: (Last 24 Hours)     No intake or output data in the 24 hours ending 05/07/24 0708        Physical Exam       Physical Exam  Vitals reviewed.   Constitutional:       General: He is not in acute distress.  HENT:      Head: Normocephalic and atraumatic.      Nose: Nose normal.      Mouth/Throat:      Mouth: Mucous membranes are moist.      Pharynx: Oropharynx is clear.   Eyes:      Conjunctiva/sclera: Conjunctivae normal.   Cardiovascular:      Rate and Rhythm: Normal rate and regular rhythm.   Pulmonary:      Effort: No respiratory distress.      Breath sounds: No wheezing or rales.   Abdominal:      General: Abdomen is flat. There is no distension.      Palpations: Abdomen is soft.      Tenderness: There is no abdominal tenderness.   Musculoskeletal:      Right lower leg: No edema.      Left lower leg: No edema.   Skin:  General: Skin is warm and dry.   Neurological:      Mental Status: He is oriented to person, place, and time.   Psychiatric:         Mood and Affect: Mood normal.         Behavior: Behavior normal.         Lab Review     Hematology:    Lab Results   Component Value Date HGB 13.9 05/07/2024    HGB 15.1 07/12/2022    HCT 43.0 05/07/2024    HCT 46.7 07/12/2022    PLTCT 217 05/07/2024    PLTCT 228 07/12/2022    WBC 4.50 05/07/2024    WBC 8.0 07/12/2022    NEUT 85.1 05/07/2024    NEUT 66 07/12/2022    ANC 3.90 05/07/2024    ANC 5.33 07/12/2022    LYMPH 34 06/24/2004    ALC 0.50 05/07/2024    ALC 1.64 07/12/2022    MONA 3.7 05/07/2024    MONA 8 07/12/2022    AMC 0.20 05/07/2024    AMC 0.64 07/12/2022    EOSA 0.0 05/07/2024    EOSA 4 07/12/2022    ABC 0.00 05/07/2024    ABC 0.09 07/12/2022    BASOPHILS 1 06/24/2004    MCV 82.0 05/07/2024    MCV 85.2 07/12/2022    MCH 26.5 05/07/2024    MCH 27.5 07/12/2022    MCHC 32.3 05/07/2024    MCHC 32.3 07/12/2022    MPV 8.0 05/07/2024    MPV 8.0 07/12/2022    RDW 17.1 05/07/2024    RDW 15.1 07/12/2022       Point of Care Testing  (Last 24 hours)  Glucose: (!) 215 (05/07/24 0539)  POC Glucose (Download): (!) 225 (05/07/24 0448)      Radiology and Other Diagnostic Review      CHEST SINGLE VIEW  Result Date: 05/06/2024  Mild left basilar atelectasis/infiltrate. No consolidation or effusion. Consider follow-up PA and lateral chest x-ray.  Finalized by Cathaleen JONETTA Race, MD on 05/06/2024 6:12 PM. Dictated by Cathaleen JONETTA Race, MD on 05/06/2024 6:10 PM.      Pertinent radiology reviewed.          [1]   Medications Prior to Admission   Medication Sig Dispense Refill Last Dose/Taking    albuterol  0.083% (PROVENTIL ) 2.5 mg /3 mL (0.083 %) nebulizer solution Inhale 3 mL solution by nebulizer as directed every 4 hours as needed. Indications: asthma attack 90 mL 0     albuterol  sulfate (PROAIR  HFA) 90 mcg/actuation HFA aerosol inhaler Inhale one puff to two puffs by mouth into the lungs every 4 hours as needed for Wheezing (or shortness of breath). Indications: asthma attack 6.7 g 0     betamethasone dipropionate (DIPROSONE) 0.05 % topical ointment USE TO WORST ITCHY AREAS ON BODY TWICE DAILY AS NEEDED       budesonide -glycopyr-formoterol  (BREZTRI  AEROSPHERE) 160-9-4.8 mcg/actuation inhaler Inhale two puffs by mouth into the lungs twice daily. 10.7 g 11     bumetanide  (BUMEX ) 1 mg tablet Take one tablet by mouth daily. 90 tablet 3     carvediloL  (COREG ) 12.5 mg tablet Take three tablets by mouth twice daily with meals. Take with food.  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       clopiDOGreL  (PLAVIX ) 75 mg tablet Take one tablet  by mouth daily. Indications: a sudden worsening of angina called acute coronary syndrome 90 tablet 1     DEXCOM G7 SENSOR sensor device Use one each as directed daily.       dulaglutide (TRULICITY) 0.75 mg/0.5 mL injection pen Inject 0.5 mL under the skin every 7 days.       DUPIXENT PEN 300 mg/2 mL injectable PEN Inject 2 mL under the skin every 7 days.       ELIQUIS  5 mg tablet TAKE 1 TABLET (5 MG) BY MOUTH TWICE DAILY       empagliflozin  (JARDIANCE ) 10 mg tablet Take half tablet by mouth daily. 45 tablet 3     fluticasone  propionate (FLONASE ) 50 mcg/actuation nasal spray, suspension Apply one spray to each nostril as directed daily.       gabapentin  (NEURONTIN ) 300 mg capsule Take one capsule by mouth three times daily. (Patient taking differently: Take two capsules by mouth twice daily as needed. Indications: PAIN)       hydrALAZINE  (APRESOLINE ) 50 mg tablet Take one tablet by mouth three times daily. Indications: chronic heart failure 270 tablet 0     HYDROcodone /acetaminophen  (NORCO) 7.5/325 mg tablet Take one tablet by mouth twice daily as needed for Pain. Moderate to severe pain.       isosorbide  dinitrate (ISORDIL  TITRADOSE) 20 mg tablet Take one tablet by mouth three times daily. Indications: chronic heart failure 270 tablet 0     mupirocin  (CENTANY ) 2 % topical ointment Apply  topically to affected area three times daily. Apply to the front of the nose as needed up to three times per day 22 g 1 Nebulizer Accessories kit Please dispense one nebulizer kit 1 kit 0     nicotine  (NICODERM CQ ) 14 mg/day patch Apply one patch to top of skin as directed daily. Rotate patch location.  Indications: stop smoking 28 patch 0     rosuvastatin  (CRESTOR ) 40 mg tablet Take one tablet by mouth at bedtime daily. Indications: excessive fat in the blood 90 tablet 1     sacubitriL -valsartan  (ENTRESTO ) 97-103 mg tablet Take one tablet by mouth twice daily. 180 tablet 3     tamsulosin  (FLOMAX ) 0.4 mg capsule Take one capsule by mouth daily. (Patient taking differently: Take one capsule by mouth daily as needed.)       tiZANidine  (ZANAFLEX ) 4 mg tablet Take one tablet by mouth every 6-8 hours as needed. NTE 3 doses in 24hrs.       traZODone  (DESYREL ) 100 mg tablet Take two tablets by mouth at bedtime daily. (Patient taking differently: Take two tablets by mouth at bedtime as needed.)      [2]   Allergies  Allergen Reactions    Contrast Dye Iv, Iodine Containing [Iodinated Contrast Media] HIVES    Isovue-128 [Iopamidol] HIVES     Returned to [facility]ICU post cardiac cath with hives on 12/09/2007 @ 1700    Nitroglycerin  HEADACHE     migraines

## 2024-05-07 NOTE — Care Plan [600008]
 [facility] CONSULTATION    ASSESSMENT/RECOMMENDATIONS  Patient was referred for [facility] consultation  Tobacco Use Treatment Practical Counseling was provided in hospital (including recognizing danger situations, developing coping skills and providing basic information about quitting).           MEDICATION RECOMMENDATIONS TO QUIT TOBACCO:  In-patient quit-tobacco medication: Provided education to patient about available medication and coverage Nicotine  patch (21 mg), Nicotine  lozenge (4 mg)   Discharge medication options: Provided education to patient about available medication and coverage Nicotine  patch (21 mg), Nicotine  lozenge (4 mg)     Post discharge support referral: Accepted texting support program    [employer] Material: Accepted  Materials will be mailed, emailed, or accessed by patient on our website [email or web address]  for this consult.    History of Present Illness   Reports using 10 cigarettes per day.  Reports using tobacco within 6-30 minutes minutes of waking.  E-Cigarette or vape use: Never  Other tobacco use: None   Years used: unable to assess  Withdrawal: Slight nicotine  withdrawal based upon the patient's rating on the Nicotine  Withdrawal Behavior Rating Scale.   Patient does not live with other smokers or vapers  Plan about smoking after patient leaves the hospital: I plan to try to quit when I leave the hospital  Set a quit date:  No    TOBACCO TREATMENT MEDICATION VOUCHER ASSESSMENT  Other support/service available to get NRT (such as VA Benefits, [facility], etc): []  YES [x]  NO     Notes on insurance coverage? Medicare and [place] Medicaid  Is patient eligible for tobacco treatment medication voucher?: No  Contact Information    If I can be of further assistance, please call [facility] [phone] or [phone], or [employer]  Smoking Cessation Counselor

## 2024-05-07 NOTE — Progress Notes [1]
 RT Adult Assessment Note    NAME:Sean Henderson             MRN: [id number]             DOB:06-04-1955          AGE: 69 y.o.  ADMISSION DATE: 05/06/2024             DAYS ADMITTED: LOS: 1 day    Additional Comments:  Impressions of the patient: Pt was sleeping in bed on 2 liters of O2. Pt has history of COPD and is still currently smoking daily. He takes Breztri , albuterol , and one more inhaler that he cannot remember. He is wearing O2 at night, but said he will try and have his home CPAP machine brought to him tomorrow. Chest x-ray shows slight scaring and atelectasis.   Intervention(s)/outcome(s): RT eval per protocol   Patient education that was completed: none  Recommendations to the care team: none    Vital Signs:  Pulse: 88  RR: 16 PER MINUTE  SpO2: 95 %  O2 Device: Nasal cannula  Liter Flow: 2 Lpm  O2%:      Breath Sounds:   All Breath Sounds: Decreased  Respiratory Effort:   Respiratory Effort/Pattern: Unlabored  Comments:

## 2024-05-07 NOTE — Care Plan [600008]
 Problem: Discharge Planning  Goal: Participation in plan of care  Outcome: Goal Ongoing  Goal: Knowledge regarding plan of care  Outcome: Goal Ongoing  Goal: Prepared for discharge  Outcome: Goal Ongoing     Problem: Tobacco Use  Goal: Knowledge of tobacco-use cessation methods  Outcome: Goal Ongoing     Problem: Glucose Management  Goal: Absence of hyperglycemia  Outcome: Goal Ongoing  Goal: Absence of Hypoglycemia  Outcome: Goal Ongoing  Goal: Glucose level within specified parameters  Outcome: Goal Ongoing

## 2024-05-07 NOTE — Progress Notes [1]
 Profile updated, care plan/education reviewed, and call light within reach.

## 2024-05-08 ENCOUNTER — Encounter: Admit: 2024-05-08 | Discharge: 2024-05-08 | Payer: MEDICARE

## 2024-05-08 LAB — CBC AND DIFF
~~LOC~~ BKR MCHC: 33 g/dL — ABNORMAL HIGH (ref 32.0–36.0)
~~LOC~~ BKR MPV: 8.1 fL (ref 7.0–11.0)
~~LOC~~ BKR PLATELET COUNT: 204 10*3/uL — CL (ref 150–400)
~~LOC~~ BKR RDW: 17 % — ABNORMAL HIGH (ref 11.0–15.0)

## 2024-05-08 LAB — POC GLUCOSE
~~LOC~~ BKR POC GLUCOSE: 131 mg/dL — ABNORMAL HIGH (ref 70–100)
~~LOC~~ BKR POC GLUCOSE: 127 mg/dL — ABNORMAL HIGH (ref 70–100)
~~LOC~~ BKR POC GLUCOSE: 137 mg/dL — ABNORMAL HIGH (ref 70–100)
~~LOC~~ BKR POC GLUCOSE: 255 mg/dL — ABNORMAL HIGH (ref 70–100)

## 2024-05-08 MED ORDER — ALBUTEROL SULFATE 2.5 MG /3 ML (0.083 %) IN NEBU
2.5 mg | Freq: Four times a day (QID) | RESPIRATORY_TRACT | 0 refills | Status: DC | PRN
Start: 2024-05-08 — End: 2024-05-11
  Administered 2024-05-08 – 2024-05-11 (×13): 2.5 mg via RESPIRATORY_TRACT

## 2024-05-08 MED ORDER — LIDOCAINE 5 % TP PTMD
1 | Freq: Every day | TOPICAL | 0 refills | Status: DC
Start: 2024-05-08 — End: 2024-05-11
  Administered 2024-05-09: 07:00:00 1 via TOPICAL

## 2024-05-08 NOTE — Progress Notes [1]
 RT Adult Assessment Note    NAME:Sean Henderson             MRN: [id number]             DOB:December 30, 1955          AGE: 69 y.o.  ADMISSION DATE: 05/06/2024             DAYS ADMITTED: LOS: 2 days    Additional Comments:  Intervention(s)/outcome(s): Albuterol  Q4H&PRN  Recommendations to the care team: Continue to monitor respiratory changes    Vital Signs:  Pulse: 64  RR: 16 PER MINUTE  SpO2: 98 %  O2 Device: CPAP/BiPAP  Liter Flow:    O2%:      Breath Sounds:      Respiratory Effort:      Comments:

## 2024-05-08 NOTE — Patient Refusal [8510041]
 Patient/family declined:  Bed/chair alarm, W/in arms reach during toileting/showering, and Ambulation.    Patient/family educated on importance of intervention to their safety/quality of care. Patient/family continues to decline care.    Reason Why Patient/Family declined:  Patient stated he does not need help with ambulation to the bathroom, but will call us  if he needs assistance.    Individualized safety and/or care plan implemented. If additional safety measures implemented, please list them.     Escalated to:  Unit coordinator/charge nurse and Provider: Marsa Mitchell MD.

## 2024-05-08 NOTE — Progress Notes [1]
 General Progress Note    Name:  Sean Henderson   Today's Date:  05/08/2024  Admission Date: 05/06/2024  LOS: 2 days                       ASSESSMENT & PLAN     Principal Problem:    COPD exacerbation (CMS-HCC)      Sean Henderson is a 69 y.o. male with PMHx significant for COPD, OSA, current tobacco use, CAD, CHF, HTN, HLD who was admitted on 05/06/2024 for COPD exacerbation.       INTERVAL UPDATES 05/08/2024  - Chest x-ray - Mild left basilar atelectasis/infiltrate. No consolidation or effusion. Consider follow-up PA and lateral chest x-ray.   - Full RVP - negative    - Unremarkable VBG 1/14   - CPAP started night of 1/14 with significant improvement in mentation this morning   - Patient has a family member that can bring his life vest into the hospital   - Discussed smoking cessation with nicotine  patches       #COPD exacerbation  - Negative for COVID-19, Influenza A/B, and RSV   - Mild left basilar atelectasis/infiltrate. No consolidation or effusion. Consider follow-up PA and lateral chest x-ray.   - Full RVP - negative   PLAN:  > Start Duoneb q4h prn  > Prednisone  40mg  - for 5d course of steroids (LD: 1/17)  > Azithromycin  250mg  - for 5d course of abx  (LD: 1/17)  > Continue PTA Fluticasone  furoate Ellipta 1 puff daily   > Supplemental O2 as needed  > Obtain sputum culture when able   > Encourage incentive spirometry   > Initiate LDCF insulin  given high blood glucose on prednisone         #HFrEF  #CAD  #HTN  #HLD  - Per chart review, dry weight 238 lb  - 1/14 admission wt 239 lb  - 1/13 trop 15.8 > 18.6 > 42.8 (h) -> 13.4   - 1/13 NT-pro-BNP - 2444 (similar in December)   -- 03/24/2024 - ECHO - LVEF 35%. Global hypokinesis. Grade I (mild) left ventricular diastolic dysfunction. Cannot determine left atrial pressure. The right ventricular size is normal. The right ventricular systolic function is normal. Cardiac valve structures were not fully evaluated.  -- 03/28/2024: Cardiac Cath: Moderate nonobstructive coronary artery disease. Prior stent in the proximal to mid left anterior descending with a diffuse moderate in-stent restenosis, maximum 50%. Right PDA has a 90% stenosis which is a medium caliber vessel. RCA has a moderate diffuse disease throughout the course.  PLAN:  > Trend troponin until peak  > Bumex  2mg  IV x1 - reassess in AM  > Continue PTA rosuvastatin , carvedilol   > Cardiac diet with 2L/d fluid restriction  > Continue Eliquis  5 mg BID   > Continue Plavix  75 mg daily   > Continue Entresto  97-103 mg BID   > Continue Isordil  20 mg TID   > Continue Hydralazine  50 mg TID   > Obtain life vest while in the hospital from home     #OSA  PLAN:  > CPAP nightly     #CKD3b  - Per chart review, baseline Cr 1.6-2.0  - 1/13 admission Cr 1.95  PLAN:  > CTM daily CMP    #BPH  -Continue tamsulosin       #Tobacco use  PLAN:  > Nicotine  patch      FEN:   >PRN IVF  >Electrolytes reviewed. K>4, Mg>2   >DIET  CARDIAC(LOW FAT/LOW SODIUM)   DVT Ppx: Lovenox   Code Status: Full Code    Dispo: Continue inpt evaluation and management of COPD exacerbation.     Pt seen and discussed with my attending, Dr. Chandra, who directed above plan of care.    Marsa Mitchell, M.D.   Internal Medicine Resident - PGY-1  Available by [facility] and Mobile paging  ________________________________________________________________________      SUBJECTIVE     No acute events overnight.  Patient wore CPAP overnight and states that he feels significantly improved this morning.  Mentation is significantly improved this morning.  Patient states that he continues to have a moderately productive cough however he denies fevers, chills, body aches, CP, SOB, n/v/d.  Patient states that a family member can bring his LifeVest into the hospital.      Medications     Scheduled Meds:albuterol -ipratropium (DUONEB) nebulizer solution 3 mL, 3 mL, Inhalation, Q4H & PRN  apixaban  (ELIQUIS ) tablet 5 mg, 5 mg, Oral, BID  azithromycin  (ZITHROMAX ) tablet 250 mg, 250 mg, Oral, QDAY  carvedilol  (COREG ) tablet 37.5 mg, 37.5 mg, Oral, BID w/meals  cetirizine  (ZyrTEC ) tablet 10 mg, 10 mg, Oral, QDAY  clopiDOGreL  (PLAVIX ) tablet 75 mg, 75 mg, Oral, QDAY  fluticasone  furoate (ARNUITY ELLIPTA ) 100 mcg/actuation inhaler 1 puff, 1 puff, Inhalation, QDAY   And  umeclidinium-vilanteroL (ANORO ELLIPTA ) 62.5-25 mcg/actuation inhaler 1 puff, 1 puff, Inhalation, QDAY  fluticasone  propionate (FLONASE ) nasal spray 1 spray, 1 spray, Each Nostril, QDAY  gabapentin  (NEURONTIN ) capsule 300 mg, 300 mg, Oral, TID  hydrALAZINE  (APRESOLINE ) tablet 50 mg, 50 mg, Oral, TID  insulin  aspart (U-100) (NOVOLOG  FLEXPEN U-100 INSULIN ) injection PEN 0-6 Units, 0-6 Units, Subcutaneous, ACHS (22)  isosorbide  dinitrate (ISORDIL ) tablet 20 mg, 20 mg, Oral, TID  nicotine  (NICODERM CQ ) 7 mg/day patch 1 patch, 1 patch, Transdermal, QDAY  predniSONE  (DELTASONE ) tablet 40 mg, 40 mg, Oral, QDAY w/breakfast  rosuvastatin  (CRESTOR ) tablet 40 mg, 40 mg, Oral, QHS  [Held by Provider] sacubitriL -valsartan  (ENTRESTO ) 97-103 mg tablet 1 tablet, 1 tablet, Oral, BID  tamsulosin  (FLOMAX ) capsule 0.4 mg, 0.4 mg, Oral, QDAY    Continuous Infusions:  PRN and Respiratory Meds:acetaminophen  Q4H PRN, dextrose  50% PRN, melatonin QHS PRN, ondansetron  Q6H PRN **OR** ondansetron  Q6H PRN, polyethylene glycol 3350  QDAY PRN, sennosides-docusate sodium  QDAY PRN, [Held by Provider] tiZANidine  Q6H PRN, traZODone  QHS PRN      Prescriptions Prior to Admission[1]    Allergies     Allergies[2]    OBJECTIVE                            Vital Signs: Last Filed                 Vital Signs: 24 Hour Range   BP: 142/63 (01/15 0430)  Temp: 36.4 ?C (97.5 ?F) (01/15 0430)  Pulse: 73 (01/15 0500)  Respirations: 15 PER MINUTE (01/15 0500)  SpO2: 95 % (01/15 0500)  O2 Device: CPAP/BiPAP (01/15 0500)  O2 Liter Flow: 2 Lpm (01/14 1025) BP: (105-146)/(49-82)   Temp:  [36.3 ?C (97.4 ?F)-36.7 ?C (98.1 ?F)]   Pulse:  [68-78]   Respirations:  [15 PER MINUTE-19 PER MINUTE] SpO2:  [92 %-96 %]   O2 Device: CPAP/BiPAP  O2 Liter Flow: 2 Lpm   Intensity Pain Scale (Self Report): 10 (05/07/24 2138) Vitals:    05/06/24 2357 05/07/24 0128   Weight: 108.8 kg (239 lb 13.8 oz) 108.1 kg (238 lb 5.1 oz)  Intake/Output Summary: (Last 24 Hours)       Intake/Output Summary (Last 24 hours) at 05/08/2024 0658  Last data filed at 05/08/2024 0500  Gross per 24 hour   Intake 955 ml   Output --   Net 955 ml           Physical Exam       Physical Exam  Vitals reviewed.   Constitutional:       General: He is not in acute distress.  HENT:      Head: Normocephalic and atraumatic.      Nose: Nose normal.      Mouth/Throat:      Mouth: Mucous membranes are moist.      Pharynx: Oropharynx is clear.   Eyes:      Conjunctiva/sclera: Conjunctivae normal.   Cardiovascular:      Rate and Rhythm: Normal rate and regular rhythm.   Pulmonary:      Effort: No respiratory distress.      Breath sounds: No wheezing or rales.      Comments: Patient is wearing CPAP during exam  Abdominal:      General: Abdomen is flat. There is no distension.      Palpations: Abdomen is soft.      Tenderness: There is no abdominal tenderness.   Musculoskeletal:      Right lower leg: No edema.      Left lower leg: No edema.   Skin:     General: Skin is warm and dry.   Neurological:      Mental Status: He is oriented to person, place, and time.   Psychiatric:         Mood and Affect: Mood normal.         Behavior: Behavior normal.         Lab Review     Hematology:    Lab Results   Component Value Date    HGB 13.4 05/08/2024    HGB 15.1 07/12/2022    HCT 40.8 05/08/2024    HCT 46.7 07/12/2022    PLTCT 204 05/08/2024    PLTCT 228 07/12/2022    WBC 8.30 05/08/2024    WBC 8.0 07/12/2022    NEUT 76.3 05/08/2024    NEUT 66 07/12/2022    ANC 6.30 05/08/2024    ANC 5.33 07/12/2022    LYMPH 34 06/24/2004    ALC 1.00 05/08/2024    ALC 1.64 07/12/2022    MONA 11.5 05/08/2024    MONA 8 07/12/2022    AMC 1.00 05/08/2024    AMC 0.64 07/12/2022 EOSA 0.2 05/08/2024    EOSA 4 07/12/2022    ABC 0.00 05/08/2024    ABC 0.09 07/12/2022    BASOPHILS 1 06/24/2004    MCV 82.7 05/08/2024    MCV 85.2 07/12/2022    MCH 27.3 05/08/2024    MCH 27.5 07/12/2022    MCHC 33.0 05/08/2024    MCHC 32.3 07/12/2022    MPV 8.1 05/08/2024    MPV 8.0 07/12/2022    RDW 17.4 05/08/2024    RDW 15.1 07/12/2022       Point of Care Testing  (Last 24 hours)  POC Glucose (Download): (!) 131 (05/07/24 2108)      Radiology and Other Diagnostic Review      CHEST SINGLE VIEW  Result Date: 05/06/2024  Mild left basilar atelectasis/infiltrate. No consolidation or effusion. Consider follow-up PA and lateral chest x-ray.  Finalized by Cathaleen JONETTA Race, MD on  05/06/2024 6:12 PM. Dictated by Cathaleen JONETTA Race, MD on 05/06/2024 6:10 PM.      Pertinent radiology reviewed.            [1]   Medications Prior to Admission   Medication Sig Dispense Refill Last Dose/Taking    albuterol  0.083% (PROVENTIL ) 2.5 mg /3 mL (0.083 %) nebulizer solution Inhale 3 mL solution by nebulizer as directed every 4 hours as needed. Indications: asthma attack 90 mL 0     albuterol  sulfate (PROAIR  HFA) 90 mcg/actuation HFA aerosol inhaler Inhale one puff to two puffs by mouth into the lungs every 4 hours as needed for Wheezing (or shortness of breath). Indications: asthma attack 6.7 g 0     betamethasone dipropionate (DIPROSONE) 0.05 % topical ointment USE TO WORST ITCHY AREAS ON BODY TWICE DAILY AS NEEDED       budesonide -glycopyr-formoterol  (BREZTRI  AEROSPHERE) 160-9-4.8 mcg/actuation inhaler Inhale two puffs by mouth into the lungs twice daily. 10.7 g 11     bumetanide  (BUMEX ) 1 mg tablet Take one tablet by mouth daily. 90 tablet 3     carvediloL  (COREG ) 12.5 mg tablet Take three tablets by mouth twice daily with meals. Take with food.  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 clopiDOGreL  (PLAVIX ) 75 mg tablet Take one tablet by mouth daily. Indications: a sudden worsening of angina called acute coronary syndrome 90 tablet 1     DEXCOM G7 SENSOR sensor device Use one each as directed daily.       dulaglutide (TRULICITY) 0.75 mg/0.5 mL injection pen Inject 0.5 mL under the skin every 7 days.       DUPIXENT PEN 300 mg/2 mL injectable PEN Inject 2 mL under the skin every 7 days.       ELIQUIS  5 mg tablet TAKE 1 TABLET (5 MG) BY MOUTH TWICE DAILY       empagliflozin  (JARDIANCE ) 10 mg tablet Take half tablet by mouth daily. 45 tablet 3     fluticasone  propionate (FLONASE ) 50 mcg/actuation nasal spray, suspension Apply one spray to each nostril as directed daily.       gabapentin  (NEURONTIN ) 300 mg capsule Take one capsule by mouth three times daily. (Patient taking differently: Take two capsules by mouth twice daily as needed. Indications: PAIN)       hydrALAZINE  (APRESOLINE ) 50 mg tablet Take one tablet by mouth three times daily. Indications: chronic heart failure 270 tablet 0     HYDROcodone /acetaminophen  (NORCO) 7.5/325 mg tablet Take one tablet by mouth twice daily as needed for Pain. Moderate to severe pain.       isosorbide  dinitrate (ISORDIL  TITRADOSE) 20 mg tablet Take one tablet by mouth three times daily. Indications: chronic heart failure 270 tablet 0     mupirocin  (CENTANY ) 2 % topical ointment Apply  topically to affected area three times daily. Apply to the front of the nose as needed up to three times per day 22 g 1     Nebulizer Accessories kit Please dispense one nebulizer kit 1 kit 0     nicotine  (NICODERM CQ ) 14 mg/day patch Apply one patch to top of skin as directed daily. Rotate patch location.  Indications: stop smoking 28 patch 0     rosuvastatin  (CRESTOR ) 40 mg tablet Take one tablet by mouth at bedtime daily. Indications: excessive fat in the blood 90 tablet 1  sacubitriL -valsartan  (ENTRESTO ) 97-103 mg tablet Take one tablet by mouth twice daily. 180 tablet 3 tamsulosin  (FLOMAX ) 0.4 mg capsule Take one capsule by mouth daily. (Patient taking differently: Take one capsule by mouth daily as needed.)       tiZANidine  (ZANAFLEX ) 4 mg tablet Take one tablet by mouth every 6-8 hours as needed. NTE 3 doses in 24hrs.       traZODone  (DESYREL ) 100 mg tablet Take two tablets by mouth at bedtime daily. (Patient taking differently: Take two tablets by mouth at bedtime as needed.)      [2]   Allergies  Allergen Reactions    Contrast Dye Iv, Iodine Containing [Iodinated Contrast Media] HIVES    Isovue-128 [Iopamidol] HIVES     Returned to CICU post cardiac cath with hives on 12/09/2007 @ 1700    Nitroglycerin  HEADACHE     migraines

## 2024-05-08 NOTE — Patient Refusal [[id number]]
 Patient/family declined:  Bed/chair alarm, W/in arms reach during toileting/showering, and Ambulation.    Patient/family educated on importance of intervention to their safety/quality of care. Patient/family continues to decline care.    Reason Why Patient/Family declined:  Patient does not want help with ambulation to the bathroom. He also does not want the bed alarm on but will call if he needs help with anything.    Individualized safety and/or care plan implemented. If additional safety measures implemented, please list them.     Escalated to:  Unit coordinator/charge nurse.

## 2024-05-08 NOTE — Progress Notes [1]
 COPD Education: patient states he has not looked over his packet today he states that he just took off his BiPAP for today and that he likes to wear it when he is at home laying around and watching tv he had multiple questions about why he continues to get sick this is third admission to a hospital in the last few months; he also asked about getting more nebulizer supplies for home will cont. to educated as indicated

## 2024-05-08 NOTE — Care Plan [600008]
 Problem: Discharge Planning  Goal: Participation in plan of care  Outcome: Goal Ongoing  Goal: Knowledge regarding plan of care  Outcome: Goal Ongoing  Goal: Prepared for discharge  Outcome: Goal Ongoing     Problem: Tobacco Use  Goal: Knowledge of tobacco-use cessation methods  Outcome: Goal Ongoing     Problem: Glucose Management  Goal: Absence of hyperglycemia  Outcome: Goal Ongoing  Goal: Absence of Hypoglycemia  Outcome: Goal Ongoing  Goal: Glucose level within specified parameters  Outcome: Goal Ongoing

## 2024-05-08 NOTE — Case Mgmt DC Plan [600024]
 Case Management Admission Assessment    NAME:Sean Henderson                          MRN: [id number]             DOB:28-May-1955          AGE: 69 y.o.  ADMISSION DATE: 05/06/2024             DAYS ADMITTED: LOS: 2 days      Today?s Date: 05/08/2024    Source of Information: Patient and EMR       Plan  Plan: Case Management Assessment  This CM met with pt for assessment on this date.  Provided contact information and explanation of SW/NCM roles.  Reviewed [facility] and Preparing for Discharge hand-outs.  Provided opportunity for questions and discussion. Pt/family encouraged to contact Case Management team with questions and concerns during hospitalization and until patient is able to transition back to the patient's primary care physician.  Patient states he wears his lifevest 2-3 days out of the week all day. He wears it intermittently other days. Team aware.   6 HCBS hours/day; cooking, cleaning, laundry.  Admit for COPD exacerbation.   CM will continue to follow and assist in discharge planning as needed.  Plan to discharge home when medically stable, patient drove himself here.       Patient Address/Phone  [address]  [place] [address]  [phone] (home)     Emergency Contact  Extended Emergency Contact Information  Primary Emergency Contact: Hamre,Angela   [place]   Home Phone: [phone]  Relation: Daughter    Healthcare Directive  Healthcare Directive: No, patient does not have a healthcare directive  Would patient like to fill out a (a new) Healthcare Directive?: No, patient declined      Transportation  Does the Patient Need Case Management to Arrange Discharge Transport? (ex: facility, ambulance, wheelchair/stretcher, Medicaid, cab, other): No  Will the Patient Use Family Transport?: Yes (drove self)    Expected Discharge Date  05/09/2024     Living Situation Prior to Admission  Living Arrangements  Type of Residence: Home, with Home Health or other assistance  Living Arrangements: Alone  How many levels in the residence?: 1  Can patient live on one level if needed?: Yes  Does residence have entry and/or inside stairs?: Yes (elevator to 11th floor)  Assistance needed prior to admit or anticipated on discharge: Yes  Who provides assistance or could if needed?: HCBS  Are they in good health?: Yes  Can support system provide 24/7 care if needed?: No  Level of Function   Prior level of function: Needs assist with ADLs  Which ADLs require assistance?: cooking, cleaning, laundry  Who assists with ADLs?: HCBS caregiver  Cognitive Abilities   Cognitive Abilities: Alert and Oriented, Participates in decision making    Financial Resources  Coverage  Primary Insurance: Medicare Replacement  Secondary Insurance: Medicaid  Medicaid State: [place]   Additional Coverage: None  Medication Coverage    Medication Coverage: Medicaid  Have you experienced a noticeable increase in your copay costs recently?: No  Are current medications affordable?: Yes  Do You Use a Co-Pay Card or a Medication Assistance Program to Help Manage Medication Costs?: No  Do You Manage Your Own Medications?: Yes  Source of Income   Source Of Income: SSI  Financial Assistance Needed?  Medications are affordable.     Psychosocial Needs  Mental Health  Mental Health History: No  Substance Use History  Substance Use History Screen: Yes  Comment: tobacco  Social Determinants of Health (SDOH):  Within the past 12 months, you worried that your food would run out before you got the money to buy more.: Never true  Within the past 12 months, the food you bought just didn't last and you didn't have money to get more.: Never true  How hard is it for you to pay for the very basics like food, housing, medical care, and heating?: Not hard at all  In the past 12 months, has lack of reliable transportation kept you from medical appointments, meetings, work or from getting things needed for daily living?: No  In the past 12 months has the electric, gas, oil, or water  company threatened to shut off services in your home?: No  What is your living situation today?: I have a steady place to live  Think about the place you live. Do you have problems with any of the following?: None of the above  If for any reason you need help with day-to-day activities such as bathing, preparing meals, shopping, managing finances, etc., do you get the help you need?: I don't need any help  How often do you feel lonely or isolated from those around you?: Never    Other  N/a    Current/Previous Services  PCP  Bernardino Velma SAUNDERS, [phone], [phone]  Confirmed PCP with patient.   Pharmacy    [phone]  [place], [place] - [address]  [address]  [place] [address]  Phone: [phone] Fax: [phone]    Durable Medical Equipment   Durable Medical Equipment at home: CPAP/BiPAP, Carlis Finder, Other (comment) Clinical Research Associate)  Home Health  Receiving home health: No  Hemodialysis or Peritoneal Dialysis  Undergoing hemodialysis or peritoneal dialysis: No  Tube/Enteral Feeds  Receive tube/enteral feeds: No  Infusion  Receive infusions: No  Private Duty  Private duty help used: No  Home and Community Based Services  Home and community based services: Yes  Agency name: [employer]  HCBS assistance hours/week: 6hours/day  Provider Schedule: [employer] provided: cooking, cleaning, laundry  [employer] Supply: N/A  Hospice  Hospice: No  Outpatient Therapy  PT: No  OT: No  SLP: No  Skilled Nursing Facility/Nursing Home  SNF: No  NH: No  Inpatient Rehab  IPR: No  Long-Term Acute Care Hospital  LTACH: No  Acute Hospital Stay  Acute Hospital Stay: In the past  Was patient's stay within the last 30 days?: No      Vernell Sharps, RN, BSN Nurse Case Manager  Available on Voalte

## 2024-05-09 LAB — POC GLUCOSE
~~LOC~~ BKR POC GLUCOSE: 152 mg/dL — ABNORMAL HIGH (ref 70–100)
~~LOC~~ BKR POC GLUCOSE: 130 mg/dL — ABNORMAL HIGH (ref 70–100)
~~LOC~~ BKR POC GLUCOSE: 154 mg/dL — ABNORMAL HIGH (ref 70–100)
~~LOC~~ BKR POC GLUCOSE: 193 mg/dL — ABNORMAL HIGH (ref 70–100)

## 2024-05-09 LAB — ECG 12-LEAD
P AXIS: 78 degrees — ABNORMAL HIGH (ref <20.0)
P-R INTERVAL: 196 ms — ABNORMAL HIGH (ref <125)
Q-T INTERVAL: 416 ms
QRS DURATION: 92 ms
QTC CALCULATION (BAZETT): 449 ms
R AXIS: -11 degrees
T AXIS: -79 degrees
VENTRICULAR RATE: 70 {beats}/min

## 2024-05-09 LAB — CULTURE - RESPIRATORY

## 2024-05-09 LAB — MAGNESIUM: ~~LOC~~ BKR MAGNESIUM: 2.2 mg/dL (ref 1.6–2.6)

## 2024-05-09 LAB — HIGH SENSITIVITY TROPONIN I 4 HR
~~LOC~~ BKR HI SEN TNI DELTA 4-2: 1
~~LOC~~ BKR HIGH SENSITIVITY TROPONIN I 4 HOUR: 22 ng/L — ABNORMAL HIGH (ref <20.0)

## 2024-05-09 MED ORDER — MAGNESIUM SULFATE IN D5W 1 GRAM/100 ML IV PGBK
1 g | INTRAVENOUS | 0 refills | Status: CP
Start: 2024-05-09 — End: ?
  Administered 2024-05-09: 23:00:00 1 g via INTRAVENOUS

## 2024-05-09 MED ORDER — DEXTROMETHORPHAN-GUAIFENESIN 10-100 MG/5 ML PO SYRP
10 mL | ORAL | 0 refills | Status: AC
Start: 2024-05-09 — End: ?
  Administered 2024-05-09 – 2024-05-11 (×7): 10 mL via ORAL

## 2024-05-09 MED ORDER — MAGNESIUM SULFATE IN D5W 1 GRAM/100 ML IV PGBK
1 g | INTRAVENOUS | 0 refills | Status: CP
Start: 2024-05-09 — End: ?
  Administered 2024-05-09: 18:00:00 1 g via INTRAVENOUS

## 2024-05-09 MED ORDER — DEXTROMETHORPHAN-GUAIFENESIN 10-100 MG/5 ML PO SYRP
10 mL | ORAL | 0 refills | Status: CN | PRN
Start: 2024-05-09 — End: ?

## 2024-05-09 MED ORDER — LACTATED RINGERS IV SOLP
500 mL | INTRAVENOUS | 0 refills | Status: AC
Start: 2024-05-09 — End: ?
  Administered 2024-05-09: 18:00:00 500 mL via INTRAVENOUS

## 2024-05-09 NOTE — Discharge Summary [5]
 Discharge Summary      Name: Sean Henderson  Medical Record Number: [id number]        Account Number:  [id number]  Date Of Birth:  11-29-1955                         Age:  69 y.o.  Admit date:  05/06/2024                     Discharge date: 05/11/2024      Discharge Attending:  Dr. Rea Penna   Discharge Summary Completed By: Marsa Mitchell, MD    Service: Med 1- 2042    Reason for hospitalization:  COPD exacerbation (CMS-HCC) [J44.1]    Primary Discharge Diagnosis:   Same as Above    Hospital Diagnoses:  Hospital Problems        Resolved Problems    * (Principal) RESOLVED: COPD exacerbation (CMS-HCC)     Present on Admission:   (Resolved) COPD exacerbation (CMS-HCC)        Significant Past Medical History        Arthritis  CAD (coronary artery disease), native coronary artery      Comment:  A.2/04 Normal coronaries [facility] EF 15%  B. 2/07- Chest                pain, admit [place]: Bare metal stent to 90% mLAD, EF 45% C.               12/09/07 Angina, Cath [place]: BareMetalStent for 80% mLAD,                Kissing balloon 80% Dx2  D. 06/17/08- dobutamine stress                echo:  LV 5.2. EF 60%. No ischemia.  E. 08/25/09-                Dobutamine stress echo: Mild concentric LVH. EF 60%. LA                is mildly enlarged. No ischemia. F.  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 No surgery nor                chemo  Seasonal allergic reaction  Stroke (CMS-HCC)  Tobacco abuse    Allergies   Contrast dye iv, iodine containing [iodinated contrast media]; Isovue-128 [iopamidol]; and Nitroglycerin     Brief Hospital Course     Sean Henderson is a 69 year old male with a history of COPD, obstructive sleep apnea, coronary artery disease, congestive heart failure EF 35% with life vest, hypertension, and hyperlipidemia who was admitted on 05/06/2024 for COPD exacerbation presenting with cough, shortness of breath, and wheezing. His symptoms included a five-day history of productive cough, chest and abdominal  pain with coughing, nausea, nasal congestion, fatigue, and decreased appetite, without fever or chills. Initial evaluation included chest X-ray showing mild left basilar atelectasis/infiltrate without consolidation or effusion, and serial troponins that were elevated (15.8 > 18.6 > 42.8). Laboratory studies were notable for a creatinine of 1.95, consistent with his baseline CKD stage 3b. Management of his COPD exacerbation included albuterol  nebulizations, prednisone  40 mg daily for a five-day course, azithromycin  250 mg daily for five days, and supplemental oxygen as needed. He received bumetanide  2 mg IV for volume management, with plans to reassess. He was continued on his home CPAP nightly for OSA. Nicotine  patch was initiated for smoking cessation. Cardiac comorbidities were managed with continuation of carvedilol  and rosuvastatin , cardiac diet, and 2L/day fluid restriction. Troponins were trended due to elevation, and cardiac status was monitored. During admission, he required 2 liters of supplemental oxygen via nasal cannula, with unlabored respiratory effort and decreased breath sounds noted on exam. No acute distress or extremity edema was observed. Secondary issues addressed included CKD stage 3b with daily monitoring of renal function, and ongoing management of heart failure and coronary artery disease. Smoking cessation counseling was provided.    Day of discharge exam notable for:   Physical Exam  Vitals reviewed.   Constitutional:       General: He is not in acute distress.     Appearance: He is obese.   HENT:      Head: Normocephalic and atraumatic.      Nose: Nose normal.      Mouth/Throat:      Mouth: Mucous membranes are moist.      Pharynx: Oropharynx is clear.   Eyes:      Conjunctiva/sclera: Conjunctivae normal.   Cardiovascular:      Rate and Rhythm: Normal rate and regular rhythm.   Pulmonary:      Breath sounds: Wheezing and rhonchi present. No rales.   Abdominal:      General: Abdomen is flat. There is no distension.      Palpations: Abdomen is soft.      Tenderness: There is no abdominal tenderness.   Musculoskeletal:      Right lower leg: No edema.      Left lower leg: No edema.   Skin:     General: Skin is warm and dry.   Neurological:      Mental Status: He is alert and oriented to person, place, and time.   Psychiatric:         Mood and Affect: Mood normal.         Behavior: Behavior normal.          Items Needing Follow Up   Pending items or areas that need to be addressed at follow up:   - Establish with [facility]   - Smoking cessation     Pending Labs and Follow Up Radiology    Pending labs and/or radiology review at this time of discharge are listed below: Please note- any labs with collected status will not have a result; if this area is blank, there are no items for review.           Medications        Medication List        PAUSE taking these medications      ENTRESTO  97-103 mg tablet  Wait to take this until your doctor or other care provider tells you to start again.  Generic drug: sacubitril -valsartan   Dose: 1 tablet  Take one tablet by mouth twice daily.  Quantity: 180 tablet  Refills: 3            START taking these medications      azelastine  137 mcg (0.1 %) nasal spray  Commonly known as: ASTELIN   Dose: 2 spray  Apply two sprays to each nostril as directed twice daily. Indications: non-seasonal allergic stuffy and runny nose  For: non-seasonal allergic stuffy and runny nose  Quantity: 90 mL  Refills: 0            CONTINUE taking these medications      * albuterol  sulfate 90 mcg/actuation HFA aerosol inhaler  Commonly known as: PROAIR  HFA  Dose: 1-2 puff  Doctor's comments: Changed qty to match pkg size - 12.06.25 12[address]  Inhale one puff to two puffs by mouth into the lungs every 4 hours as needed for Wheezing (or shortness of breath). Indications: asthma attack  For: asthma attack  Quantity: 6.7 g  Refills: 0     * albuterol  0.083% 2.5 mg /3 mL (0.083 %) nebulizer solution  Commonly known as: PROVENTIL   Dose: 2.5 mg  Doctor's comments: changed qty to match pkg size - 12.06.25 1222 [address]  Inhale 3 mL solution by nebulizer as directed every 4 hours as needed. Indications: asthma attack  For: asthma attack  Quantity: 90 mL  Refills: 0     betamethasone dipropionate 0.05 % topical ointment  Commonly known as: DIPROSONE  USE TO WORST ITCHY AREAS ON BODY TWICE DAILY AS NEEDED  Refills: 0     budesonide -glycopyr-formoterol  160-9-4.8 mcg/actuation inhaler  Commonly known as: BREZTRI  AEROSPHERE  Dose: 2 puff  Inhale two puffs by mouth into the lungs twice daily.  Quantity: 10.7 g  Refills: 11     bumetanide  1 mg tablet  Commonly known as: BUMEX   Dose: 1 mg  Take one tablet by mouth daily.  Quantity: 90 tablet  Refills: 3     carvedilol  12.5 mg tablet  Commonly known as: COREG   Dose: 37.5 mg  Take three tablets by mouth twice daily with meals. Take with food.  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  Refills: 0     clopiDOGreL  75 mg tablet  Commonly known as: PLAVIX   Dose: 75 mg  Take one tablet by mouth daily. Indications: a sudden worsening of angina called acute coronary syndrome  For: a sudden worsening of angina called acute coronary syndrome  Quantity: 90 tablet  Refills: 1     DEXCOM G7 SENSOR sensor device  Generic drug: blood-glucose sensor  Dose: 1 each  Use one each as directed daily.  Refills: 0     dulaglutide 0.75 mg/0.5 mL injection pen  Commonly known as: TRULICITY  Dose: 0.75 mg  Inject 0.5 mL under the skin every 7 days.  Refills: 0     DUPIXENT PEN 300 mg/2 mL injectable PEN  Generic drug: dupilumab  Dose: 300 mg  Inject 2 mL under the skin every 7 days.  Refills: 0     ELIQUIS  5 mg tablet  Generic drug: apixaban   TAKE 1 TABLET (5 MG) BY MOUTH TWICE DAILY  Refills: 0     fluticasone  propionate 50 mcg/actuation nasal spray,  suspension  Commonly known as: FLONASE   Dose: 1 spray  Apply one spray to each nostril as directed daily.  Refills: 0     gabapentin  300 mg capsule  Commonly known as: NEURONTIN   Dose: 600 mg  Take one capsule by mouth three times daily.  Refills: 0     hydrALAZINE  50 mg tablet  Commonly known as: APRESOLINE   Dose: 50 mg  Take one tablet by mouth three times daily. Indications: chronic heart failure  For: chronic heart failure  Quantity: 270 tablet  Refills: 0     HYDROcodone /acetaminophen  7.5/325 mg tablet  Commonly known as: NORCO  Dose: 1 tablet  Take one tablet by mouth twice daily as needed for Pain. Moderate to severe pain.  Refills: 0     isosorbide  dinitrate 20 mg tablet  Commonly known as: ISORDIL  TITRADOSE  Dose: 20 mg  Take one tablet by mouth three times daily. Indications: chronic heart failure  For: chronic heart failure  Quantity: 270 tablet  Refills: 0     JARDIANCE  10 mg tablet  Generic drug: empagliflozin   Dose: 10 mg  Take half tablet by mouth daily.  Quantity: 45 tablet  Refills: 3     mupirocin  2 % topical ointment  Commonly known as: CENTANY   Apply  topically to affected area three times daily. Apply to the front of the nose as needed up to three times per day  Quantity: 22 g  Refills: 1     nebulizer accessories KIT  Please dispense one nebulizer kit  Quantity: 1 kit  Refills: 0     nicotine  14 mg/day patch  Commonly known as: NICODERM CQ   Dose: 1 patch  Apply one patch to top of skin as directed daily. Rotate patch location.  Indications: stop smoking  For: stop smoking  Quantity: 28 patch  Refills: 0     rosuvastatin  40 mg tablet  Commonly known as: CRESTOR   Dose: 40 mg  Take one tablet by mouth at bedtime daily. Indications: excessive fat in the blood  For: excessive fat in the blood  Quantity: 90 tablet  Refills: 1     tamsulosin  0.4 mg capsule  Commonly known as: FLOMAX   Dose: 0.4 mg  Take one capsule by mouth daily.  Refills: 0     tiZANidine  4 mg tablet  Commonly known as: ZANAFLEX   Dose: 4 mg  Take one tablet by mouth every 6-8 hours as needed. NTE 3 doses in 24hrs.  Refills: 0     traZODone  100 mg tablet  Commonly known as: DESYREL   Dose: 2 tablet  Take two tablets by mouth at bedtime daily.  Refills: 0           * This list has 2 medication(s) that are the same as other medications prescribed for you. Read the directions carefully, and ask your doctor or other care provider to review them with you.                   Where to Get Your Medications        These medications were sent to [facility]  [address], [place]  [place] [address]      Hours: Monday-Friday 6 a.m.-9 p.m. Saturday-Sunday 9 a.m.-5 p.m. Phone: [phone] Phone: [phone]   azelastine  137 mcg (0.1 %) nasal spray         Return Appointments and Scheduled Appointments     Scheduled appointments:      May 14, 2024 8:00 AM  Pulmonary Rehab Orientation with HC4 CARDPULM ORIENTATION SCHED  Cardiopulmonary Rehabilitation: [facility] (--) [address]  Level 4  [place]  [place] [address]     Jun 17, 2024 2:45 PM  Echo Limited with [facility] ECHO 1  Cardiovascular Medicine: [facility] (CVM Procedural) [address]  [address] G, [address]  [place]  [place] [address]  [phone]     Jun 17, 2024 4:00 PM  Office visit with Randall MARLA Sierra, APRN-NP  Cardiovascular Medicine: [facility] (CVM Exam) [address]  Level 1, [address]  [place]  [place] [address]  [phone]     Jul 01, 2024 3:30 PM  Office visit with Gladis SHAUNNA Party, MD  Cardiovascular Medicine: [facility] (CV[facility] Exam) [address]  Level 5, [place] [address]  [place]  [place] [address]  [phone]     Aug 13, 2024 2:30 PM  Office visit with Viviane Hoehn, MD  Pulmonology: [facility] (Internal Medicine) [address]  Level 4, [address]  [place]  [place] [address]  [phone]     Sep 23, 2024 2:30 PM  Office visit with Inetta SHAUNNA Hajj, MD  Cardiovascular Medicine: [facility] [place] (CVM Exam) [address]  Level 5, [place] [address]  [place]  [place] [address]  [phone]          Contact information for after-discharge care                [facility]       [facility]    Phone: [phone]    Fax: [phone]    Where: [address], [place] [address]    Service: Home Health Services                  Consults, Procedures, Diagnostics, Micro, Pathology   Consults: None  Surgical Procedures & Dates: None  Significant Diagnostic Studies, Micro and Procedures: noted in brief hospital course  Significant Pathology: noted in brief hospital course                       Discharge Disposition, Condition   Patient Disposition: Home with [facility]   Condition at Discharge: Stable    Code Status   Full Code    Patient Instructions     Activity       Activity as Tolerated   As directed      It is important to keep increasing your activity level after you leave the hospital.  Moving around can help prevent blood clots, lung infection (pneumonia) and other problems.  Gradually increasing the number of times you are up moving around will help you return to your normal activity level more quickly.  Continue to increase the number of times you are up to the chair and walking daily to return to your normal activity level. Begin to work toward your normal activity level at discharge          Diet       Cardiac Diet   As directed      Limiting unhealthy fats and cholesterol is the most important step you can take in reducing your risk for cardiovascular disease.  Unhealthy fats include saturated and trans fats.  Monitor your sodium and cholesterol intake.  Restrict your sodium to 2g (grams) or 2000mg  (milligrams) daily, and your cholesterol to 200mg  daily.  If you have questions regarding your diet at home, you may contact a dietitian at [phone].             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 RN to complete general assessment, including vitals with temperature, monitor and teach patient and/or caregiver medication management and compliance, monitor and teach pain management and pain medication compliance when necessary. Monitor and teach signs and symptoms of infection, monitor and teach disease management, including signs and symptoms, diet, who and when to call, hospital readmission avoidance. Please see attached AVS for additional discharge instruction.      Physical Therapy  to evaluate and treat along with home safety evaluation.  Teach strategies for energy conservation, mobility training, strengthening, balance activities and address deficits, maximize function and improve safety.     Occupational Therapy   to evaluate and treat along with home safety evaluation.  Teach strategies for energy conservation, mobility training, strengthening, balance activities and address deficits, maximize function and improve safety.    Clinical findings to support homebound status: Ambulates short distances only  Clinical findings to support home care services: Deficits in medical management    I certify that this patient is under my care and that I, or a nurse practitioner or physician's assistant working with me, had a face-to-face encounter that meets the physician's face-to-face encounter requirements with this patient on 05/09/2024.    This patient is under my care, and I have initiated the establishment of the plan of care.  This patient will be followed by a physician after discharge, who will periodically review the plan of care.   Question Answer Comment   Attending Name/Contact Katheryn Slain    PCP Name/Contact Bernardino Bring R [phone]    Home Health to Follow PCP                               Signed:  Marsa Mitchell, MD  05/11/2024      cc:  Primary Care Physician:  Bernardino Bring SAUNDERS   Verified    Referring physicians:  No ref. provider found   Additional provider(s):        Did we miss something? If additional records are needed, please fax a request on office letterhead to [phone]. Please include the patient's name, date  of birth, fax number and type of information needed. Additional request can be made by email at [email or web address]. For general questions of information about electronic records sharing, call [phone].

## 2024-05-09 NOTE — Progress Notes [1]
 General Progress Note    Name:  Sean Henderson   Today's Date:  05/09/2024  Admission Date: 05/06/2024  LOS: 3 days                       ASSESSMENT & PLAN     Principal Problem:    COPD exacerbation (CMS-HCC)      Sean Henderson is a 69 y.o. male with PMHx significant for COPD, OSA, current tobacco use, CAD, CHF, HTN, HLD who was admitted on 05/06/2024 for COPD exacerbation.       INTERVAL UPDATES 05/09/2024  - Chest x-ray - Mild left basilar atelectasis/infiltrate. No consolidation or effusion. Consider follow-up PA and lateral chest x-ray.   - Full RVP - negative    - Unremarkable VBG 1/14   - CPAP started night of 1/14 with significant improvement in mentation this morning   - Patient has lifevest on 1/15  - Discussed smoking cessation with nicotine  patches   - Patient states that he is having increased mucus production with coughing-initiate Robitussin, incentive spirometry, and flutter valve  - Patient states that he is having mild increased chest pain with deep inspiration given patient's cardiac history will obtain   --EKG -sinus rhythm with occasional PVCs without ST segment changes  --Troponins - 22.2, 21.4  --BNP - 1123 - below baseline       #COPD exacerbation  - Negative for COVID-19, Influenza A/B, and RSV   - Mild left basilar atelectasis/infiltrate. No consolidation or effusion. Consider follow-up PA and lateral chest x-ray.   - Full RVP - negative   PLAN:  > Continue Duoneb q4h prn  > Prednisone  40mg  - for 5d course of steroids (LD: 1/17)  > Azithromycin  250mg  - for 5d course of abx  (LD: 1/17)  > Continue PTA Fluticasone  furoate Ellipta 1 puff daily   > Supplemental O2 as needed  > Obtain sputum culture when able   > Encourage incentive spirometry and flutter valve  > Initiate LDCF insulin  given high blood glucose on prednisone         #HFrEF  #CAD  #HTN  #HLD  - Per chart review, dry weight 238 lb  - 1/14 admission wt 239 lb  - 1/13 trop 15.8 > 18.6 > 42.8 (h) -> 13.4   - 1/13 NT-pro-BNP - 2444 (similar in December)   -- 03/24/2024 - ECHO - LVEF 35%. Global hypokinesis. Grade I (mild) left ventricular diastolic dysfunction. Cannot determine left atrial pressure. The right ventricular size is normal. The right ventricular systolic function is normal. Cardiac valve structures were not fully evaluated.  -- 03/28/2024: Cardiac Cath: Moderate nonobstructive coronary artery disease. Prior stent in the proximal to mid left anterior descending with a diffuse moderate in-stent restenosis, maximum 50%. Right PDA has a 90% stenosis which is a medium caliber vessel. RCA has a moderate diffuse disease throughout the course.  PLAN:  > Holding diuretics in setting of AKI  > Continue PTA rosuvastatin , carvedilol   > Cardiac diet with 2L/d fluid restriction  > Continue Eliquis  5 mg BID   > Continue Plavix  75 mg daily   > Holding Entresto  97-103 mg BID due to AKI  > Continue Isordil  20 mg TID   > Continue Hydralazine  50 mg TID   > Patient is wearing LifeVest from home on 1/15-current    #OSA  PLAN:  > CPAP nightly and when resting     #CKD3b  - Per chart review, baseline Cr  1.6-2.0  - 1/13 admission Cr 1.95  PLAN:  > CTM daily CMP    #BPH  -Continue tamsulosin       #Tobacco use  PLAN:  > Nicotine  patch      FEN:   >PRN IVF  >Electrolytes reviewed. K>4, Mg>2   >DIET CARDIAC(LOW FAT/LOW SODIUM)   DVT Ppx: Lovenox   Code Status: Full Code    Dispo: Continue inpt evaluation and management of COPD exacerbation.     Pt seen and discussed with my attending, Dr. Chandra, who directed above plan of care.    Marsa Mitchell, M.D.   Internal Medicine Resident - PGY-1  Available by [facility] and Mobile paging  ________________________________________________________________________      SUBJECTIVE     No acute events overnight.  Patient states that he has increased mucus production with cough.  He also states that he took a shower last night after which he felt significantly more fatigued than his baseline.  He is continuing to wear his LifeVest consistently while in hospital.  He states that he is feeling about the same or slightly worse than yesterday with mildly increased chest pain with deep inspiration not brought on by exertion.  He denies fevers, chills, body aches, nausea, vomiting, and diarrhea.    Medications     Scheduled Meds:albuterol  0.083% (PROVENTIL ) nebulizer solution 2.5 mg, 2.5 mg, Inhalation, QID & PRN  apixaban  (ELIQUIS ) tablet 5 mg, 5 mg, Oral, BID  azithromycin  (ZITHROMAX ) tablet 250 mg, 250 mg, Oral, QDAY  carvedilol  (COREG ) tablet 37.5 mg, 37.5 mg, Oral, BID w/meals  cetirizine  (ZyrTEC ) tablet 10 mg, 10 mg, Oral, QDAY  clopiDOGreL  (PLAVIX ) tablet 75 mg, 75 mg, Oral, QDAY  fluticasone  furoate (ARNUITY ELLIPTA ) 100 mcg/actuation inhaler 1 puff, 1 puff, Inhalation, QDAY   And  umeclidinium-vilanteroL (ANORO ELLIPTA ) 62.5-25 mcg/actuation inhaler 1 puff, 1 puff, Inhalation, QDAY  fluticasone  propionate (FLONASE ) nasal spray 1 spray, 1 spray, Each Nostril, QDAY  gabapentin  (NEURONTIN ) capsule 300 mg, 300 mg, Oral, TID  hydrALAZINE  (APRESOLINE ) tablet 50 mg, 50 mg, Oral, TID  insulin  aspart (U-100) (NOVOLOG  FLEXPEN U-100 INSULIN ) injection PEN 0-6 Units, 0-6 Units, Subcutaneous, ACHS (22)  isosorbide  dinitrate (ISORDIL ) tablet 20 mg, 20 mg, Oral, TID  lidocaine  (LIDODERM ) 5 % topical patch 1 patch, 1 patch, Topical, QDAY  nicotine  (NICODERM CQ ) 7 mg/day patch 1 patch, 1 patch, Transdermal, QDAY  predniSONE  (DELTASONE ) tablet 40 mg, 40 mg, Oral, QDAY w/breakfast  rosuvastatin  (CRESTOR ) tablet 40 mg, 40 mg, Oral, QHS  [Held by Provider] sacubitriL -valsartan  (ENTRESTO ) 97-103 mg tablet 1 tablet, 1 tablet, Oral, BID  tamsulosin  (FLOMAX ) capsule 0.4 mg, 0.4 mg, Oral, QDAY    Continuous Infusions:  PRN and Respiratory Meds:acetaminophen  Q4H PRN, dextrose  50% PRN, melatonin QHS PRN, ondansetron  Q6H PRN **OR** ondansetron  Q6H PRN, polyethylene glycol 3350  QDAY PRN, sennosides-docusate sodium  QDAY PRN, [Held by Provider] tiZANidine  Q6H PRN, traZODone  QHS PRN      Prescriptions Prior to Admission[1]    Allergies     Allergies[2]    OBJECTIVE                            Vital Signs: Last Filed                 Vital Signs: 24 Hour Range   BP: 117/71 (01/16 0444)  Temp: 36.6 ?C (97.8 ?F) (01/16 0444)  Pulse: 69 (01/16 0444)  Respirations: 20 PER MINUTE (01/16 0444)  SpO2: 95 % (01/16 0444)  O2 Device: CPAP/BiPAP (01/16 0444) BP: (104-135)/(43-79)   Temp:  [36.4 ?C (97.6 ?F)-36.6 ?C (97.8 ?F)]   Pulse:  [61-73]   Respirations:  [16 PER MINUTE-20 PER MINUTE]   SpO2:  [92 %-98 %]   O2 Device: CPAP/BiPAP   Intensity Pain Scale (Self Report): 7 (05/08/24 2128) Vitals:    05/06/24 2357 05/07/24 0128   Weight: 108.8 kg (239 lb 13.8 oz) 108.1 kg (238 lb 5.1 oz)           Intake/Output Summary: (Last 24 Hours)       Intake/Output Summary (Last 24 hours) at 05/09/2024 0628  Last data filed at 05/09/2024 0445  Gross per 24 hour   Intake 1277 ml   Output 1100 ml   Net 177 ml           Physical Exam       Physical Exam  Vitals reviewed.   Constitutional:       General: He is not in acute distress.  HENT:      Head: Normocephalic and atraumatic.      Nose: Nose normal.      Mouth/Throat:      Mouth: Mucous membranes are moist.      Pharynx: Oropharynx is clear.   Eyes:      Conjunctiva/sclera: Conjunctivae normal.   Cardiovascular:      Rate and Rhythm: Normal rate and regular rhythm.   Pulmonary:      Breath sounds: No wheezing or rales.      Comments: Patient is wearing CPAP during exam  Abdominal:      General: Abdomen is flat. There is no distension.      Palpations: Abdomen is soft.      Tenderness: There is no abdominal tenderness.   Musculoskeletal:      Right lower leg: No edema.      Left lower leg: No edema.   Skin:     General: Skin is warm and dry.   Neurological:      Mental Status: He is oriented to person, place, and time.   Psychiatric:         Mood and Affect: Mood normal.         Behavior: Behavior normal.         Lab Review Hematology:    Lab Results   Component Value Date    HGB 13.4 05/08/2024    HGB 15.1 07/12/2022    HCT 40.8 05/08/2024    HCT 46.7 07/12/2022    PLTCT 204 05/08/2024    PLTCT 228 07/12/2022    WBC 8.30 05/08/2024    WBC 8.0 07/12/2022    NEUT 76.3 05/08/2024    NEUT 66 07/12/2022    ANC 6.30 05/08/2024    ANC 5.33 07/12/2022    LYMPH 34 06/24/2004    ALC 1.00 05/08/2024    ALC 1.64 07/12/2022    MONA 11.5 05/08/2024    MONA 8 07/12/2022    AMC 1.00 05/08/2024    AMC 0.64 07/12/2022    EOSA 0.2 05/08/2024    EOSA 4 07/12/2022    ABC 0.00 05/08/2024    ABC 0.09 07/12/2022    BASOPHILS 1 06/24/2004    MCV 82.7 05/08/2024    MCV 85.2 07/12/2022    MCH 27.3 05/08/2024    MCH 27.5 07/12/2022    MCHC 33.0 05/08/2024    MCHC 32.3 07/12/2022    MPV 8.1 05/08/2024    MPV 8.0 07/12/2022  RDW 17.4 05/08/2024    RDW 15.1 07/12/2022       Point of Care Testing  (Last 24 hours)  POC Glucose (Download): (!) 152 (05/08/24 2124)      Radiology and Other Diagnostic Review      CHEST SINGLE VIEW  Result Date: 05/06/2024  Mild left basilar atelectasis/infiltrate. No consolidation or effusion. Consider follow-up PA and lateral chest x-ray.  Finalized by Cathaleen JONETTA Race, MD on 05/06/2024 6:12 PM. Dictated by Cathaleen JONETTA Race, MD on 05/06/2024 6:10 PM.      Pertinent radiology reviewed.              [1]   Medications Prior to Admission   Medication Sig Dispense Refill Last Dose/Taking    albuterol  0.083% (PROVENTIL ) 2.5 mg /3 mL (0.083 %) nebulizer solution Inhale 3 mL solution by nebulizer as directed every 4 hours as needed. Indications: asthma attack 90 mL 0     albuterol  sulfate (PROAIR  HFA) 90 mcg/actuation HFA aerosol inhaler Inhale one puff to two puffs by mouth into the lungs every 4 hours as needed for Wheezing (or shortness of breath). Indications: asthma attack 6.7 g 0     betamethasone dipropionate (DIPROSONE) 0.05 % topical ointment USE TO WORST ITCHY AREAS ON BODY TWICE DAILY AS NEEDED budesonide -glycopyr-formoterol  (BREZTRI  AEROSPHERE) 160-9-4.8 mcg/actuation inhaler Inhale two puffs by mouth into the lungs twice daily. 10.7 g 11     bumetanide  (BUMEX ) 1 mg tablet Take one tablet by mouth daily. 90 tablet 3     carvediloL  (COREG ) 12.5 mg tablet Take three tablets by mouth twice daily with meals. Take with food.  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       clopiDOGreL  (PLAVIX ) 75 mg tablet Take one tablet by mouth daily. Indications: a sudden worsening of angina called acute coronary syndrome 90 tablet 1     DEXCOM G7 SENSOR sensor device Use one each as directed daily.       dulaglutide (TRULICITY) 0.75 mg/0.5 mL injection pen Inject 0.5 mL under the skin every 7 days.       DUPIXENT PEN 300 mg/2 mL injectable PEN Inject 2 mL under the skin every 7 days.       ELIQUIS  5 mg tablet TAKE 1 TABLET (5 MG) BY MOUTH TWICE DAILY       empagliflozin  (JARDIANCE ) 10 mg tablet Take half tablet by mouth daily. 45 tablet 3     fluticasone  propionate (FLONASE ) 50 mcg/actuation nasal spray, suspension Apply one spray to each nostril as directed daily.       gabapentin  (NEURONTIN ) 300 mg capsule Take one capsule by mouth three times daily. (Patient taking differently: Take two capsules by mouth twice daily as needed. Indications: PAIN)       hydrALAZINE  (APRESOLINE ) 50 mg tablet Take one tablet by mouth three times daily. Indications: chronic heart failure 270 tablet 0     HYDROcodone /acetaminophen  (NORCO) 7.5/325 mg tablet Take one tablet by mouth twice daily as needed for Pain. Moderate to severe pain.       isosorbide  dinitrate (ISORDIL  TITRADOSE) 20 mg tablet Take one tablet by mouth three times daily. Indications: chronic heart failure 270 tablet 0     mupirocin  (CENTANY ) 2 % topical ointment Apply  topically to affected area three times daily. Apply to the front of the nose as needed up to  three times per day 22 g 1     Nebulizer Accessories kit Please dispense one nebulizer kit 1 kit 0     nicotine  (NICODERM CQ ) 14 mg/day patch Apply one patch to top of skin as directed daily. Rotate patch location.  Indications: stop smoking 28 patch 0     rosuvastatin  (CRESTOR ) 40 mg tablet Take one tablet by mouth at bedtime daily. Indications: excessive fat in the blood 90 tablet 1     sacubitriL -valsartan  (ENTRESTO ) 97-103 mg tablet Take one tablet by mouth twice daily. 180 tablet 3     tamsulosin  (FLOMAX ) 0.4 mg capsule Take one capsule by mouth daily. (Patient taking differently: Take one capsule by mouth daily as needed.)       tiZANidine  (ZANAFLEX ) 4 mg tablet Take one tablet by mouth every 6-8 hours as needed. NTE 3 doses in 24hrs.       traZODone  (DESYREL ) 100 mg tablet Take two tablets by mouth at bedtime daily. (Patient taking differently: Take two tablets by mouth at bedtime as needed.)      [2]   Allergies  Allergen Reactions    Contrast Dye Iv, Iodine Containing [Iodinated Contrast Media] HIVES    Isovue-128 [Iopamidol] HIVES     Returned to CICU post cardiac cath with hives on 12/09/2007 @ 1700    Nitroglycerin  HEADACHE     migraines

## 2024-05-09 NOTE — Progress Notes [1]
 OCCUPATIONAL THERAPY  NOTE     Name: Sean Henderson   MRN: [id number]     DOB: [birth year]      Age: 69 y.o.  Admission Date: 05/06/2024     LOS: 3 days     Date of Service: 05/09/2024    Based on discussion with PT, patient's current level of function suggests that patient will progress with one discipline. OT will sign off at this time. Please re-consult if a change in functional status occurs.    Therapist: Mliss Karis Pickle, OTR/L  Date: 05/09/2024

## 2024-05-09 NOTE — Patient Refusal [[id number]]
 Patient/family declined:  Bed/chair alarm.    Patient/family educated on importance of intervention to their safety/quality of care. Patient/family continues to decline care.    Reason Why Patient/Family declined:  Patient stated he is not going anywhere. If he is in need of help, he will call. Education was done..    Individualized safety and/or care plan implemented. If additional safety measures implemented, please list them.     Escalated to:  Unit coordinator/charge nurse.

## 2024-05-09 NOTE — Progress Notes [1]
 PHYSICAL THERAPY  ASSESSMENT      Name: Sean Henderson   MRN: [id number]     DOB: 1955-05-07      Age: 69 y.o.  Admission Date: 05/06/2024     LOS: 3 days     Date of Service: 05/09/2024      Mobility  Patient Turn/Position: Self  Mobility Level [facility] Highest Level of Mobility (JH-HLM): Walk 25 feet or more (to doorway/hallway)  Distance Walked (feet): 75 ft  Level of Assistance: Stand by assistance  Assistive Device: None  Activity Limited By: Fatigue;Shortness of air    Subjective  Reason for Admission and Past Medical Hx: Sean Henderson is a 69 y.o. male with PMHx significant for COPD, OSA, current tobacco use, CAD, CHF, HTN, HLD who was admitted on 05/06/2024 for COPD exacerbation.  Special Considerations: Current oxygen requirement (CPAP)  Mental / Cognitive: Alert;Oriented;To person;To place;To situation;Follows commands  Pain: Complains of pain  Pain level: Before activity;During activity;After activity;5/10  Pain Location: Back  Pain Description: Aching  Pain Interventions: Patient agrees to participate in therapy with current pain level;Patient assisted into position of comfort    Home Living Situation  Lives With: Alone;Receives assistance from home health  Type of Home: Apartment  Entry Stairs: Elevator  In-Home Stairs: Able to live on main level  Bathroom Setup: Walk in shower  Patient Owned Equipment: None    Prior Level of Function  Level Of Independence: Independent with ADL and community mobility without device;Assist needed for IADL  Required Assist For: Meal preparation;Medication/finance management  History of Falls in Past 3 Months: No        ROM  ROM Position Assessed: Seated  Lumbar ROM: Not WFL  Lumbar ROM Method: Active  R LE ROM: WFL  R LE ROM Method: Active  L LE ROM: WFL  L LE ROM Method: Active  ROM Comments: osteoarthritis throughout lumbar spine, limited functional bend/sid-bend    Strength  Strength Position Assessed: Seated  Overall Strength: Generalized weakness      Bed Mobility/Transfer  Bed Mobility: Supine to Sit: Independent  Bed Mobility: Sit to Supine: Independent  Transfer Type: Sit to Stand  Transfer: Assistance Level: To/From;Bed;Standby Assist  Transfer: Assistive Device: None  Transfers: Type Of Assistance: Verbal Cues;For Strength Deficit  End Of Activity Status: In Bed;Nursing Notified;Instructed Patient to Request Assist with Mobility;Instructed Patient to Use Call Light    Balance  Sitting Balance: Static Sitting Balance;Dynamic Sitting Balance;No UE Support;Independent  Standing Balance: Static Standing Balance;Dynamic Standing Balance;Standby Assist;No UE support  5x Sit to Stand Result (seconds): 35  Completed 5x STS from bed with no AD. Used UE 's on thighs.     Gait  Gait Distance: 75 feet  Gait: Assistance Level: Standby Assist  Gait: Assistive Device: None  Gait: Descriptors: Pace: Slow;Decreased step length;Variable step length  Comments: pt needed frequent standing rests due to coughing with production. He was able to continue with ambulation and reports this is has been his normal. Following ambulation, pt reporting SOA. SpO2 at 92% seated EOB, checked again after 5x STS. Spo2 at 94%. Pt reports SOA decreased once sitting.  Activity Limited By: SOA      Activity/Exercise  Sit Edge Of Bed: 5 minutes  Sit Edge Of Bed Assist: Independent  End Activity Status: In Bed  Activity Tolerance: Tolerates less than 10 minutes w/ No Changes in Vitals  Therapeutic Exercise: RLE;LLE;Active ROM  Exercise Repetitions: 10 (LAQ, marches, 5x STS)  Education  Persons Educated: Patient  Patient Barriers To Learning: None Noted  Interventions: Repetition of Instructions  Teaching Methods: Verbal Instruction  Patient Response: Verbalized Understanding  Topics: Plan/Goals of PT Interventions;Mobility Progression;Recommend Continued Therapy    Assessment/Progress  Impaired Mobility Due To: Medical Status Limitation;Decreased Activity Tolerance  Impaired Strength Due To: Medical Status Limitation  Assessment/Progress: Should Improve w/ Continued PT  Comments: Pt demonstrates limited activity tolerance due to SOA and deconditioning. He reports not using AD prior to admit, however, would like to have one in his room to walk around. Pt reports having oxygen at home would help him and interested in talking to provider about this. Pt will benefit from ongoing PT to address functional deficits.    AM-PAC 6 Clicks Basic Mobility Inpatient  Turning from your back to your side while in a flat bed without using bed rails: None  Moving from lying on your back to sitting on the side of a flat bed without using bedrails : None  Moving to and from a bed to a chair (including a wheelchair): None  Standing up from a chair using your arms (e.g. wheelchair, or bedside chair): None  To walk in hospital room: A Little  Climbing 3-5 steps with a railing: A Little  Basic Mobility Inpatient Raw Score: 22  Standardized (T-scale) Score: 47.4  AM-PAC Basic Mobility Functional Stage: 34-51 Limited Mobility Indoors  Mobility Goal [facility]: JH-HLM 7 Walk >= 25 ft    Goals  Goal Formulation: With Patient  Patient Will Transfer Sit to Stand: Independently  Patient Will Ambulate: 151-200 Feet, Independently  Patient Will Go Up / Down Stairs: 1-2 Stairs, w/ Stand By Assist    Plan  Treatment Interventions: Mobility training;Endurance training  Plan Frequency: 3-5 Days per Week  PT Plan for Next Visit: progress gait, STS's. LE strengthening, trial 1-2 steps      PT Discharge Recommendations  Recommendation: Home with intermittent supervision/assistance  Recommendation for Therapy Post Discharge: Home health  Patient Currently Requires Physical Assist With: In and out of house;Meal preparation;All home functioning ADLs  Patient Currently Requires Equipment: None      Therapist  Logan Bracket, PT, DPT  Date  05/09/2024

## 2024-05-09 NOTE — Care Plan [600008]
 Problem: Discharge Planning  Goal: Participation in plan of care  Outcome: Goal Ongoing  Goal: Knowledge regarding plan of care  Outcome: Goal Ongoing  Goal: Prepared for discharge  Outcome: Goal Ongoing     Problem: Tobacco Use  Goal: Knowledge of tobacco-use cessation methods  Outcome: Goal Ongoing     Problem: Glucose Management  Goal: Absence of hyperglycemia  Outcome: Goal Ongoing  Goal: Absence of Hypoglycemia  Outcome: Goal Ongoing  Goal: Glucose level within specified parameters  Outcome: Goal Ongoing

## 2024-05-10 LAB — SODIUM-URINE RANDOM: ~~LOC~~ BKR UR SODIUM, RAN: 45 mmol/L

## 2024-05-10 LAB — CREATININE-URINE RANDOM: ~~LOC~~ BKR UR CREATININE, RAN: 77 mg/dL

## 2024-05-10 LAB — POC GLUCOSE
~~LOC~~ BKR POC GLUCOSE: 128 mg/dL — ABNORMAL HIGH (ref 70–100)
~~LOC~~ BKR POC GLUCOSE: 243 mg/dL — ABNORMAL HIGH (ref 70–100)
~~LOC~~ BKR POC GLUCOSE: 337 mg/dL — ABNORMAL HIGH (ref 70–100)

## 2024-05-10 LAB — CULTURE - RESPIRATORY

## 2024-05-10 LAB — UREA NITROGEN-URINE RANDOM: ~~LOC~~ BKR UR UREA NIT, RAN: 726 mg/dL

## 2024-05-10 MED ORDER — OXYCODONE 15 MG PO TAB
7.5 mg | Freq: Once | ORAL | 0 refills | Status: CP
Start: 2024-05-10 — End: ?
  Administered 2024-05-11: 03:00:00 7.5 mg via ORAL

## 2024-05-10 MED ORDER — LACTATED RINGERS IV BOLUS
500 mL | Freq: Once | INTRAVENOUS | 0 refills | Status: CP
Start: 2024-05-10 — End: ?
  Administered 2024-05-10: 18:00:00 500 mL via INTRAVENOUS

## 2024-05-10 NOTE — Progress Notes [1]
 Heart Failure Nursing Progress Note    Admission Date: 05/06/2024  LOS: 4 days    Admission Weight: 108.8 kg (239 lb 13.8 oz)        Most recent weights (inpatient):   Vitals:    05/07/24 0128 05/09/24 0930 05/10/24 0424   Weight: 108.1 kg (238 lb 5.1 oz) 110.7 kg (244 lb) 112.2 kg (247 lb 6.4 oz)     Weight change from previous day:Yes    Fluid restriction ordered: Yes 2L    Intake/Output Summary: (Last 24 hours)    Intake/Output Summary (Last 24 hours) at 05/10/2024 0719  Last data filed at 05/10/2024 0700  Gross per 24 hour   Intake 200 ml   Output 1400 ml   Net -1200 ml       Is patient incontinent No    Heart Failure Education provided: Yes     - Heart Failure Zone Sheet and daily weight discussed  - Medications (including diuretics) reviewed during administration  - Fluid restriction discussed  - Low sodium diet discussed    Anticipated discharge date: 05/10/24  Discharge goals: Wear Life Vest Everyday        Daily Assessment of Patient Stated Goals:    Short Term Goal Identified by patient (Short Term=during hospitalization):  Control coughing spells and wearing CPAP

## 2024-05-10 NOTE — Progress Notes [1]
 Heart Failure Nursing Progress Note    Admission Date: 05/06/2024  LOS: 4 days    Admission Weight: 108.8 kg (239 lb 13.8 oz)        Most recent weights (inpatient):   Vitals:    05/07/24 0128 05/09/24 0930 05/10/24 0424   Weight: 108.1 kg (238 lb 5.1 oz) 110.7 kg (244 lb) 112.2 kg (247 lb 6.4 oz)     Weight change from previous day: yes      Fluid restriction ordered: yes    Intake/Output Summary: (Last 24 hours)    Intake/Output Summary (Last 24 hours) at 05/10/2024 0718  Last data filed at 05/10/2024 0700  Gross per 24 hour   Intake 200 ml   Output 1400 ml   Net -1200 ml       Is patient incontinent Yes.  Is brief scale being used?   Yes    Heart Failure Education provided: Yes     - [facility] [employer] resources reviewed  - Heart Failure Zone Sheet and daily weight discussed  - Medications (including diuretics) reviewed during administration  - Fluid restriction discussed  - Low sodium diet discussed  - [facility] education printed out and provided    Anticipated discharge date: Today  Discharge goals: Fluid restriction,and be compliant with plan of care      Daily Assessment of Patient Stated Goals:    Short Term Goal Identified by patient (Short Term=during hospitalization):

## 2024-05-10 NOTE — Care Plan [600008]
 Problem: Discharge Planning  Goal: Participation in plan of care  Outcome: Goal Ongoing  Goal: Knowledge regarding plan of care  Outcome: Goal Ongoing  Goal: Prepared for discharge  Outcome: Goal Ongoing     Problem: Tobacco Use  Goal: Knowledge of tobacco-use cessation methods  Outcome: Goal Ongoing     Problem: Glucose Management  Goal: Absence of hyperglycemia  Outcome: Goal Ongoing  Goal: Absence of Hypoglycemia  Outcome: Goal Ongoing  Goal: Glucose level within specified parameters  Outcome: Goal Ongoing

## 2024-05-10 NOTE — Patient Refusal [[id number]]
 Patient/family declined:  Bed/chair alarm.    Patient/family educated on importance of intervention to their safety/quality of care. Patient/family continues to decline care.    Reason Why Patient/Family declined:    Patient was made aware of the risk..    Individualized safety and/or care plan implemented. If additional safety measures implemented, please list them.     Escalated to:  Unit coordinator/charge nurse.

## 2024-05-10 NOTE — Progress Notes [1]
 COPD EDUCATION NOTE  Impression of Patient: in bed nad;     Overview of COPD Education: no questions pt stated that he is actively trying to quit smoking and is down to half a pack a day.

## 2024-05-10 NOTE — Progress Notes [1]
 Heart Failure Nursing Progress Note    Admission Date: 05/06/2024  LOS: 4 days    Admission Weight: 108.8 kg (239 lb 13.8 oz)        Most recent weights (inpatient):   Vitals:    05/07/24 0128 05/09/24 0930 05/10/24 0424   Weight: 108.1 kg (238 lb 5.1 oz) 110.7 kg (244 lb) 112.2 kg (247 lb 6.4 oz)     Weight change from previous day: Yes    Fluid restriction ordered: Yes 2L    Intake/Output Summary: (Last 24 hours)    Intake/Output Summary (Last 24 hours) at 05/10/2024 1840  Last data filed at 05/10/2024 1833  Gross per 24 hour   Intake 800 ml   Output 2200 ml   Net -1400 ml       Is patient incontinent No    Heart Failure Education provided: Yes     - Heart Failure Zone Sheet and daily weight discussed  - Medications (including diuretics) reviewed during administration  - Fluid restriction discussed  - Low sodium diet discussed    Anticipated discharge date: 05/11/24  Discharge goals: Wear Life Vest       Daily Assessment of Patient Stated Goals:    Short Term Goal Identified by patient (Short Term=during hospitalization):  Improve cough spells

## 2024-05-10 NOTE — Patient Refusal [[id number]]
 Patient/family declined:  Bed/chair alarm, W/in arms reach during toileting/showering, and Ambulation.    Patient/family educated on importance of intervention to their safety/quality of care. Patient/family continues to decline care.    Reason Why Patient/Family declined:  Patient stated that he does not need help with ambulating, but would call us  if he needs help.    Individualized safety and/or care plan implemented. If additional safety measures implemented, please list them.     Escalated to:  Unit coordinator/charge nurse and Provider: Joesph Beth, MD.

## 2024-05-10 NOTE — Progress Notes [1]
 General Progress Note    Name:  Sean Henderson   Today's Date:  05/10/2024  Admission Date: 05/06/2024  LOS: 4 days                       ASSESSMENT & PLAN     Active Problems:    * No active hospital problems. *      Sean Henderson is a 69 y.o. male with PMHx significant for COPD, OSA, current tobacco use, CAD, CHF, HTN, HLD who was admitted on 05/06/2024 for COPD exacerbation.       INTERVAL UPDATES 05/10/2024  - Kidney function worse again today - will obtain urine lytes, give additional 500 cc IVF today and encourage PO intake      #COPD exacerbation  - Negative for COVID-19, Influenza A/B, and RSV   - Mild left basilar atelectasis/infiltrate. No consolidation or effusion. Consider follow-up PA and lateral chest x-ray.   - Full RVP - negative   PLAN:  > Continue Duoneb QID and prn  > Prednisone  40mg  - for 5d course of steroids (LD: 1/17)  > Azithromycin  250mg  - for 5d course of abx  (LD: 1/17)  > Continue PTA Fluticasone  furoate Ellipta 1 puff daily   > Supplemental O2 as needed  > Encourage incentive spirometry and flutter valve  > Robitussin to help thin mucus  > Initiate LDCF insulin  given high blood glucose on prednisone         #HFrEF  #CAD  #HTN  #HLD  - Per chart review, dry weight 238 lb  - 1/14 admission wt 239 lb  - 1/13 trop 15.8 > 18.6 > 42.8 (h) -> 13.4   - 1/13 NT-pro-BNP - 2444 (similar in December)   -- 03/24/2024 - ECHO - LVEF 35%. Global hypokinesis. Grade I (mild) left ventricular diastolic dysfunction. Cannot determine left atrial pressure. The right ventricular size is normal. The right ventricular systolic function is normal. Cardiac valve structures were not fully evaluated.  -- 03/28/2024: Cardiac Cath: Moderate nonobstructive coronary artery disease. Prior stent in the proximal to mid left anterior descending with a diffuse moderate in-stent restenosis, maximum 50%. Right PDA has a 90% stenosis which is a medium caliber vessel. RCA has a moderate diffuse disease throughout the course.  PLAN:  > Holding diuretics in setting of AKI  > Continue PTA rosuvastatin , carvedilol   > Cardiac diet  > Continue Eliquis  5 mg BID   > Continue Plavix  75 mg daily   > Holding Entresto  97-103 mg BID due to AKI  > Continue Isordil  20 mg TID   > Continue Hydralazine  50 mg TID   > Patient is wearing LifeVest from home on 1/15-current      #OSA  PLAN:  > CPAP nightly and when resting       #AKI - prerenal?  #CKD3b  - Per chart review, baseline Cr 1.6-2.0  - 1/13 admission Cr 1.95  PLAN:  > CTM daily CMP  > Check urine lytes given continued AKI  > Give additional 500 cc IVF today      #BPH  -Continue tamsulosin         #Tobacco use  > Nicotine  patch      FEN:   >PRN IVF  >Electrolytes reviewed. K>4, Mg>2   >DIET CARDIAC(LOW FAT/LOW SODIUM)   DVT Ppx: Lovenox   Code Status: Full Code    Dispo: Med1  PT/OT rec home w assistance at dc    Pt seen and  discussed with my attending, Dr. Diebolt, who directed above plan of care.    Chiquita Bitter, MD  PGY-3 Internal Medicine  Pager [phone] - On Voalte    ________________________________________________________________________      Sean Henderson. Feels like he is clearing out his secretions somewhat today. Breathing feels stable. Mental status stable. Reports eating and drinking well. No new concerns.     Medications     Scheduled Meds:albuterol  0.083% (PROVENTIL ) nebulizer solution 2.5 mg, 2.5 mg, Inhalation, QID & PRN  apixaban  (ELIQUIS ) tablet 5 mg, 5 mg, Oral, BID  carvedilol  (COREG ) tablet 37.5 mg, 37.5 mg, Oral, BID w/meals  cetirizine  (ZyrTEC ) tablet 10 mg, 10 mg, Oral, QDAY  clopiDOGreL  (PLAVIX ) tablet 75 mg, 75 mg, Oral, QDAY  dextromethorphan -guaiFENesin  (ROBITUSSIN-DM) oral syrup 10 mL, 10 mL, Oral, Q6H  fluticasone  furoate (ARNUITY ELLIPTA ) 100 mcg/actuation inhaler 1 puff, 1 puff, Inhalation, QDAY   And  umeclidinium-vilanteroL (ANORO ELLIPTA ) 62.5-25 mcg/actuation inhaler 1 puff, 1 puff, Inhalation, QDAY  fluticasone  propionate (FLONASE ) nasal spray 1 spray, 1 spray, Each Nostril, QDAY  gabapentin  (NEURONTIN ) capsule 300 mg, 300 mg, Oral, TID  hydrALAZINE  (APRESOLINE ) tablet 50 mg, 50 mg, Oral, TID  insulin  aspart (U-100) (NOVOLOG  FLEXPEN U-100 INSULIN ) injection PEN 0-6 Units, 0-6 Units, Subcutaneous, ACHS (22)  isosorbide  dinitrate (ISORDIL ) tablet 20 mg, 20 mg, Oral, TID  lactated ringers  IV bolus 500 mL, 500 mL, Intravenous, ONCE  lidocaine  (LIDODERM ) 5 % topical patch 1 patch, 1 patch, Topical, QDAY  nicotine  (NICODERM CQ ) 7 mg/day patch 1 patch, 1 patch, Transdermal, QDAY  rosuvastatin  (CRESTOR ) tablet 40 mg, 40 mg, Oral, QHS  [Held by Provider] sacubitriL -valsartan  (ENTRESTO ) 97-103 mg tablet 1 tablet, 1 tablet, Oral, BID  tamsulosin  (FLOMAX ) capsule 0.4 mg, 0.4 mg, Oral, QDAY    Continuous Infusions:  PRN and Respiratory Meds:acetaminophen  Q4H PRN, dextrose  50% PRN, melatonin QHS PRN, ondansetron  Q6H PRN **OR** ondansetron  Q6H PRN, polyethylene glycol 3350  QDAY PRN, sennosides-docusate sodium  QDAY PRN, [Held by Provider] tiZANidine  Q6H PRN, traZODone  QHS PRN      Prescriptions Prior to Admission[1]    Allergies     Allergies[2]    OBJECTIVE                            Vital Signs: Last Filed                 Vital Signs: 24 Hour Range   BP: 146/66 (01/17 1100)  Temp: 36.5 ?C (97.7 ?F) (01/17 1100)  Pulse: 66 (01/17 1100)  Respirations: 18 PER MINUTE (01/17 1100)  SpO2: 96 % (01/17 1100)  O2%: 21 % (01/17 0014)  O2 Device: None (Room air) (01/17 1100) BP: (137-160)/(65-94)   Temp:  [36.2 ?C (97.1 ?F)-36.7 ?C (98.1 ?F)]   Pulse:  [65-75]   Respirations:  [18 PER MINUTE-20 PER MINUTE]   SpO2:  [92 %-99 %]   O2%:  [21 %]   O2 Device: None (Room air)     Vitals:    05/07/24 0128 05/09/24 0930 05/10/24 0424   Weight: 108.1 kg (238 lb 5.1 oz) 110.7 kg (244 lb) 112.2 kg (247 lb 6.4 oz)           Intake/Output Summary: (Last 24 Hours)       Intake/Output Summary (Last 24 hours) at 05/10/2024 1204  Last data filed at 05/10/2024 0700  Gross per 24 hour   Intake 200 ml Output 1400 ml   Net -1200 ml  Physical Exam       Physical Exam  Vitals reviewed.   Constitutional:       General: He is not in acute distress.  HENT:      Head: Normocephalic and atraumatic.      Nose: Nose normal.      Mouth/Throat:      Mouth: Mucous membranes are moist.      Pharynx: Oropharynx is clear.   Eyes:      Conjunctiva/sclera: Conjunctivae normal.   Cardiovascular:      Rate and Rhythm: Normal rate and regular rhythm.   Pulmonary:      Effort: Pulmonary effort is normal.      Breath sounds: Normal breath sounds. No wheezing or rales.   Abdominal:      General: Abdomen is flat. There is no distension.      Palpations: Abdomen is soft.      Tenderness: There is no abdominal tenderness.   Musculoskeletal:      Right lower leg: No edema.      Left lower leg: No edema.   Skin:     General: Skin is warm and dry.   Neurological:      Mental Status: He is oriented to person, place, and time.   Psychiatric:         Mood and Affect: Mood normal.         Behavior: Behavior normal.         Lab Review     Hematology:    Lab Results   Component Value Date    HGB 14.0 05/10/2024    HGB 15.1 07/12/2022    HCT 43.0 05/10/2024    HCT 46.7 07/12/2022    PLTCT 225 05/10/2024    PLTCT 228 07/12/2022    WBC 9.40 05/10/2024    WBC 8.0 07/12/2022    NEUT 77.9 05/10/2024    NEUT 66 07/12/2022    ANC 7.30 05/10/2024    ANC 5.33 07/12/2022    LYMPH 34 06/24/2004    ALC 1.20 05/10/2024    ALC 1.64 07/12/2022    MONA 9.7 05/10/2024    MONA 8 07/12/2022    AMC 0.90 05/10/2024    AMC 0.64 07/12/2022    EOSA 0.0 05/10/2024    EOSA 4 07/12/2022    ABC 0.00 05/10/2024    ABC 0.09 07/12/2022    BASOPHILS 1 06/24/2004    MCV 82.7 05/10/2024    MCV 85.2 07/12/2022    MCH 26.9 05/10/2024    MCH 27.5 07/12/2022    MCHC 32.5 05/10/2024    MCHC 32.3 07/12/2022    MPV 7.9 05/10/2024    MPV 8.0 07/12/2022    RDW 17.4 05/10/2024    RDW 15.1 07/12/2022       Point of Care Testing  (Last 24 hours)  Glucose: 96 (05/10/24 0442)  POC Glucose (Download): (!) 243 (05/10/24 1135)      Radiology and Other Diagnostic Review      CHEST SINGLE VIEW  Result Date: 05/06/2024  Mild left basilar atelectasis/infiltrate. No consolidation or effusion. Consider follow-up PA and lateral chest x-ray.  Finalized by Cathaleen JONETTA Race, MD on 05/06/2024 6:12 PM. Dictated by Cathaleen JONETTA Race, MD on 05/06/2024 6:10 PM.      Pertinent radiology reviewed.                [1]   Medications Prior to Admission   Medication Sig Dispense Refill Last Dose/Taking  albuterol  0.083% (PROVENTIL ) 2.5 mg /3 mL (0.083 %) nebulizer solution Inhale 3 mL solution by nebulizer as directed every 4 hours as needed. Indications: asthma attack 90 mL 0     albuterol  sulfate (PROAIR  HFA) 90 mcg/actuation HFA aerosol inhaler Inhale one puff to two puffs by mouth into the lungs every 4 hours as needed for Wheezing (or shortness of breath). Indications: asthma attack 6.7 g 0     betamethasone dipropionate (DIPROSONE) 0.05 % topical ointment USE TO WORST ITCHY AREAS ON BODY TWICE DAILY AS NEEDED       budesonide -glycopyr-formoterol  (BREZTRI  AEROSPHERE) 160-9-4.8 mcg/actuation inhaler Inhale two puffs by mouth into the lungs twice daily. 10.7 g 11     bumetanide  (BUMEX ) 1 mg tablet Take one tablet by mouth daily. 90 tablet 3     carvediloL  (COREG ) 12.5 mg tablet Take three tablets by mouth twice daily with meals. Take with food.  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       clopiDOGreL  (PLAVIX ) 75 mg tablet Take one tablet by mouth daily. Indications: a sudden worsening of angina called acute coronary syndrome 90 tablet 1     DEXCOM G7 SENSOR sensor device Use one each as directed daily.       dulaglutide (TRULICITY) 0.75 mg/0.5 mL injection pen Inject 0.5 mL under the skin every 7 days.       DUPIXENT PEN 300 mg/2 mL injectable PEN Inject 2 mL under the skin every 7 days.       ELIQUIS  5 mg tablet TAKE 1 TABLET (5 MG) BY MOUTH TWICE DAILY       empagliflozin  (JARDIANCE ) 10 mg tablet Take half tablet by mouth daily. 45 tablet 3     fluticasone  propionate (FLONASE ) 50 mcg/actuation nasal spray, suspension Apply one spray to each nostril as directed daily.       gabapentin  (NEURONTIN ) 300 mg capsule Take one capsule by mouth three times daily. (Patient taking differently: Take two capsules by mouth twice daily as needed. Indications: PAIN)       hydrALAZINE  (APRESOLINE ) 50 mg tablet Take one tablet by mouth three times daily. Indications: chronic heart failure 270 tablet 0     HYDROcodone /acetaminophen  (NORCO) 7.5/325 mg tablet Take one tablet by mouth twice daily as needed for Pain. Moderate to severe pain.       isosorbide  dinitrate (ISORDIL  TITRADOSE) 20 mg tablet Take one tablet by mouth three times daily. Indications: chronic heart failure 270 tablet 0     mupirocin  (CENTANY ) 2 % topical ointment Apply  topically to affected area three times daily. Apply to the front of the nose as needed up to three times per day 22 g 1     Nebulizer Accessories kit Please dispense one nebulizer kit 1 kit 0     nicotine  (NICODERM CQ ) 14 mg/day patch Apply one patch to top of skin as directed daily. Rotate patch location.  Indications: stop smoking 28 patch 0     rosuvastatin  (CRESTOR ) 40 mg tablet Take one tablet by mouth at bedtime daily. Indications: excessive fat in the blood 90 tablet 1     sacubitriL -valsartan  (ENTRESTO ) 97-103 mg tablet Take one tablet by mouth twice daily. 180 tablet 3     tamsulosin  (FLOMAX ) 0.4 mg capsule Take one capsule by mouth daily. (Patient taking differently: Take one capsule by mouth daily as needed.)  tiZANidine  (ZANAFLEX ) 4 mg tablet Take one tablet by mouth every 6-8 hours as needed. NTE 3 doses in 24hrs.       traZODone  (DESYREL ) 100 mg tablet Take two tablets by mouth at bedtime daily. (Patient taking differently: Take two tablets by mouth at bedtime as needed.)      [2]   Allergies  Allergen Reactions    Contrast Dye Iv, Iodine Containing [Iodinated Contrast Media] HIVES    Isovue-128 [Iopamidol] HIVES     Returned to CICU post cardiac cath with hives on 12/09/2007 @ 1700    Nitroglycerin  HEADACHE     migraines

## 2024-05-11 ENCOUNTER — Encounter: Admit: 2024-05-11 | Discharge: 2024-05-11 | Payer: MEDICARE

## 2024-05-11 DIAGNOSIS — I2510 Atherosclerotic heart disease of native coronary artery without angina pectoris: Secondary | ICD-10-CM

## 2024-05-11 DIAGNOSIS — R5383 Other fatigue: Secondary | ICD-10-CM

## 2024-05-11 DIAGNOSIS — E785 Hyperlipidemia, unspecified: Secondary | ICD-10-CM

## 2024-05-11 DIAGNOSIS — E1151 Type 2 diabetes mellitus with diabetic peripheral angiopathy without gangrene: Secondary | ICD-10-CM

## 2024-05-11 DIAGNOSIS — N400 Enlarged prostate without lower urinary tract symptoms: Secondary | ICD-10-CM

## 2024-05-11 DIAGNOSIS — J441 Chronic obstructive pulmonary disease with (acute) exacerbation: Principal | ICD-10-CM

## 2024-05-11 DIAGNOSIS — Z79899 Other long term (current) drug therapy: Secondary | ICD-10-CM

## 2024-05-11 DIAGNOSIS — Z716 Tobacco abuse counseling: Secondary | ICD-10-CM

## 2024-05-11 DIAGNOSIS — Z7902 Long term (current) use of antithrombotics/antiplatelets: Secondary | ICD-10-CM

## 2024-05-11 DIAGNOSIS — I130 Hypertensive heart and chronic kidney disease with heart failure and stage 1 through stage 4 chronic kidney disease, or unspecified chronic kidney disease: Secondary | ICD-10-CM

## 2024-05-11 DIAGNOSIS — T380X5A Adverse effect of glucocorticoids and synthetic analogues, initial encounter: Secondary | ICD-10-CM

## 2024-05-11 DIAGNOSIS — F17210 Nicotine dependence, cigarettes, uncomplicated: Secondary | ICD-10-CM

## 2024-05-11 DIAGNOSIS — Z923 Personal history of irradiation: Secondary | ICD-10-CM

## 2024-05-11 DIAGNOSIS — Z7951 Long term (current) use of inhaled steroids: Secondary | ICD-10-CM

## 2024-05-11 DIAGNOSIS — E1165 Type 2 diabetes mellitus with hyperglycemia: Secondary | ICD-10-CM

## 2024-05-11 DIAGNOSIS — Z7984 Long term (current) use of oral hypoglycemic drugs: Secondary | ICD-10-CM

## 2024-05-11 DIAGNOSIS — N179 Acute kidney failure, unspecified: Secondary | ICD-10-CM

## 2024-05-11 DIAGNOSIS — I252 Old myocardial infarction: Secondary | ICD-10-CM

## 2024-05-11 DIAGNOSIS — I493 Ventricular premature depolarization: Secondary | ICD-10-CM

## 2024-05-11 DIAGNOSIS — Z833 Family history of diabetes mellitus: Secondary | ICD-10-CM

## 2024-05-11 DIAGNOSIS — I5042 Chronic combined systolic (congestive) and diastolic (congestive) heart failure: Secondary | ICD-10-CM

## 2024-05-11 DIAGNOSIS — Z1152 Encounter for screening for COVID-19: Secondary | ICD-10-CM

## 2024-05-11 DIAGNOSIS — Z8673 Personal history of transient ischemic attack (TIA), and cerebral infarction without residual deficits: Secondary | ICD-10-CM

## 2024-05-11 DIAGNOSIS — Z91041 Radiographic dye allergy status: Secondary | ICD-10-CM

## 2024-05-11 DIAGNOSIS — Z9889 Other specified postprocedural states: Secondary | ICD-10-CM

## 2024-05-11 DIAGNOSIS — R7989 Other specified abnormal findings of blood chemistry: Secondary | ICD-10-CM

## 2024-05-11 DIAGNOSIS — Z888 Allergy status to other drugs, medicaments and biological substances status: Secondary | ICD-10-CM

## 2024-05-11 DIAGNOSIS — Z8249 Family history of ischemic heart disease and other diseases of the circulatory system: Secondary | ICD-10-CM

## 2024-05-11 DIAGNOSIS — E1122 Type 2 diabetes mellitus with diabetic chronic kidney disease: Secondary | ICD-10-CM

## 2024-05-11 DIAGNOSIS — Z7985 Long-term (current) use of injectable non-insulin antidiabetic drugs: Secondary | ICD-10-CM

## 2024-05-11 DIAGNOSIS — Z7901 Long term (current) use of anticoagulants: Secondary | ICD-10-CM

## 2024-05-11 DIAGNOSIS — T82855A Stenosis of coronary artery stent, initial encounter: Secondary | ICD-10-CM

## 2024-05-11 DIAGNOSIS — G4733 Obstructive sleep apnea (adult) (pediatric): Secondary | ICD-10-CM

## 2024-05-11 DIAGNOSIS — Z79620 Long term (current) use of immunosuppressive biologic: Secondary | ICD-10-CM

## 2024-05-11 DIAGNOSIS — Z8546 Personal history of malignant neoplasm of prostate: Secondary | ICD-10-CM

## 2024-05-11 LAB — CBC AND DIFF
~~LOC~~ BKR RBC COUNT: 5 10*6/uL — ABNORMAL LOW (ref 4.40–5.50)
~~LOC~~ BKR WBC COUNT: 9.5 10*3/uL — ABNORMAL LOW (ref 4.50–11.00)

## 2024-05-11 LAB — POC GLUCOSE
~~LOC~~ BKR POC GLUCOSE: 294 mg/dL — ABNORMAL HIGH (ref 70–100)
~~LOC~~ BKR POC GLUCOSE: 107 mg/dL — ABNORMAL HIGH (ref 70–100)
~~LOC~~ BKR POC GLUCOSE: 144 mg/dL — ABNORMAL HIGH (ref 70–100)

## 2024-05-11 MED ORDER — AZELASTINE 137 MCG (0.1 %) NA SPRY
2 | Freq: Two times a day (BID) | NASAL | 0 refills | 50.00000 days | Status: AC
Start: 2024-05-11 — End: ?
  Filled 2024-05-11: qty 90, 75d supply, fill #0

## 2024-05-11 NOTE — Progress Notes [1]
 Discharge instructions reviewed with patient, all education, questions, and concerns addressed. This primary nurse expressed the importance of patient wearing his life vest every day, and not twice a week or when he feels like wearing it. No further questions at this time. PIV removed. Patient declined vitals prior to discharge. Patient left with belongings. Patient left with family.

## 2024-05-11 NOTE — Patient Refusal [[id number]]
 Patient/family declined:  Bed/chair alarm, W/in arms reach during toileting/showering, and Ambulation.    Patient/family educated on importance of intervention to their safety/quality of care. Patient/family continues to decline care.    Reason Why Patient/Family declined:  Patient stated that he does not need help with ambulation or assistance to the bathroom.He stated he will call if any assistance is needed    Individualized safety and/or care plan implemented. If additional safety measures implemented, please list them.     Escalated to:  Unit coordinator/charge nurse and Provider: Toribio Newness, MD.

## 2024-05-11 NOTE — Care Plan [600008]
 Problem: Discharge Planning  Goal: Participation in plan of care  Outcome: Goal Ongoing  Goal: Knowledge regarding plan of care  Outcome: Goal Ongoing  Goal: Prepared for discharge  Outcome: Goal Ongoing     Problem: Tobacco Use  Goal: Knowledge of tobacco-use cessation methods  Outcome: Goal Ongoing     Problem: Glucose Management  Goal: Absence of hyperglycemia  Outcome: Goal Ongoing  Goal: Absence of Hypoglycemia  Outcome: Goal Ongoing  Goal: Glucose level within specified parameters  Outcome: Goal Ongoing

## 2024-05-11 NOTE — Care Plan [[id number]]
 Problem: Discharge Planning  Goal: Participation in plan of care  05/11/2024 1411 by Claudene Herder, RN  Outcome: Goal Achieved  05/11/2024 0958 by Claudene Herder, RN  Outcome: Goal Ongoing  Goal: Knowledge regarding plan of care  05/11/2024 1411 by Claudene Herder, RN  Outcome: Goal Achieved  05/11/2024 0958 by Claudene Herder, RN  Outcome: Goal Ongoing  Goal: Prepared for discharge  05/11/2024 1411 by Claudene Herder, RN  Outcome: Goal Achieved  05/11/2024 0958 by Claudene Herder, RN  Outcome: Goal Ongoing     Problem: Tobacco Use  Goal: Knowledge of tobacco-use cessation methods  05/11/2024 1411 by Claudene Herder, RN  Outcome: Goal Achieved  05/11/2024 0958 by Claudene Herder, RN  Outcome: Goal Ongoing     Problem: Glucose Management  Goal: Absence of hyperglycemia  05/11/2024 1411 by Claudene Herder, RN  Outcome: Goal Achieved  05/11/2024 0958 by Claudene Herder, RN  Outcome: Goal Ongoing  Goal: Absence of Hypoglycemia  05/11/2024 1411 by Claudene Herder, RN  Outcome: Goal Achieved  05/11/2024 0958 by Claudene Herder, RN  Outcome: Goal Ongoing  Goal: Glucose level within specified parameters  05/11/2024 1411 by Claudene Herder, RN  Outcome: Goal Achieved  05/11/2024 0958 by Claudene Herder, RN  Outcome: Goal Ongoing

## 2024-05-11 NOTE — Progress Notes [1]
 Heart Failure Nursing Progress Note    Admission Date: 05/06/2024  LOS: 5 days    Admission Weight: 108.8 kg (239 lb 13.8 oz)        Most recent weights (inpatient):   Vitals:    05/09/24 0930 05/10/24 0424 05/11/24 0458   Weight: 110.7 kg (244 lb) 112.2 kg (247 lb 6.4 oz) 113.9 kg (251 lb)     Weight change from previous day:yes    Fluid restriction ordered: yes    Intake/Output Summary: (Last 24 hours)    Intake/Output Summary (Last 24 hours) at 05/11/2024 0501  Last data filed at 05/11/2024 0458  Gross per 24 hour   Intake 1600 ml   Output 1400 ml   Net 200 ml       Is patient incontinent  NO  Heart Failure Education provided: Yes     - [facility] [employer] resources reviewed  - Heart Failure Zone Sheet and daily weight discussed  - Medications (including diuretics) reviewed during administration  - Fluid restriction discussed  - Low sodium diet discussed  - [facility]ames education printed out and provided    Anticipated discharge date: unknown  Discharge goals: to wear life vest      Daily Assessment of Patient Stated Goals:    Short Term Goal Identified by patient (Short Term=during hospitalization):

## 2024-05-11 NOTE — Progress Notes [1]
 RT Adult Assessment Note    NAME:Sean Henderson             MRN: [id number]             DOB:[birth year]          AGE: 69 y.o.  ADMISSION DATE: 05/06/2024             DAYS ADMITTED: LOS: 5 days    Additional Comments:  Impressions of the patient: Alert, Cooperative  Intervention(s)/outcome(s): RT assessment  Patient education that was completed: N/A  Recommendations to the care team: N/A    Vital Signs:  Pulse:  57  RR:    SpO2:  95  O2 Device:  RA  Liter Flow:    O2%:      Breath Sounds:      Respiratory Effort: Non-labored     Comments:

## 2024-05-11 NOTE — Case Mgmt DC Plan [[id number]]
 Case Management Progress Note    NAME:Trevyn Deionte Spivack                          MRN: [id number]              DOB:1955/07/16          AGE: 69 y.o.  ADMISSION DATE: 05/06/2024             DAYS ADMITTED: LOS: 5 days      Today's Date: 05/11/2024    PLAN: DC home today. Weekend NCM send updated clinical information including AVS to [facility].      Expected Discharge Date: 05/11/2024   Is Patient Medically Stable: Yes   Are there Barriers to Discharge? no    INTERVENTION/DISPOSITION:  Discharge Planning              Discharge Planning: Home Health  Weekend NCM send signed orders, clinical updates and AVS to [facility].    Transportation              Does the Patient Need Case Management to Arrange Discharge Transport? (ex: facility, ambulance, wheelchair/stretcher, Medicaid, cab, other): No  Will the Patient Use Family Transport?: Yes (drove self)  Support              Support: Pt/Family Updates re:POC or DC Plan  Info or Referral                 Positive SDOH Domains and Potential Barriers                       Medication Needs                                  Financial                 Legal                 Other                 Discharge Disposition                                                   Selected Continued Care - Admitted Since 05/06/2024    No services have been selected for the patient.           Novah Goza Enos Royal Enos RN BSN  Integrated Nurse Case Manager  [phone]/ [id number]/ Voalte [phone]

## 2024-05-13 ENCOUNTER — Encounter: Admit: 2024-05-13 | Discharge: 2024-05-13 | Payer: MEDICARE

## 2024-05-13 NOTE — Telephone Encounter [36]
 called patient as a reminder for his orientation for 1/21 at 8am. Patient stated he would be here and would check in before coming up to the fourth floor.

## 2024-05-13 NOTE — Progress Notes [1]
 Date of Service: 05/14/2024    HPI       Sean Henderson is 69 y.o. who follows with Dr. Vincenza (last seen January 2026). He has a medical history of chronic combined systolic and diastolic heart failure, ICM, s/p LAD stent (2007, 2009), PAD with SFA stenting, history of LV thrombus, HTN CKD, renal cysts, prostate cancer, COPD, tobacco use.    Below pertinent hx per chart review:  Reportedly NICM TTE 01/2003 LVEF 25% improved to 40%  05/2005: LAD PCI  12/2007: Admission for NSTEMI, BMS mid LAD ; 2010 TTE LVEF 60%  11/2014: Admission chest pain, LHC without obstructive disease   01/2017: Admission chest pain, LHC without obstructive disease LVEF 55-60%  12/2017: Drop in EF 35% LHC diffuse moderate CAD non-obstructive medical management --> 2021 LVEF 50%  TTE 02/23/2022 LVEF 45%  09/2023: LVEF 34%    He was seen today in HF clinic for hospital follow up visit. Recent hospitalizations:  At [facility] 11/30 - 03/29/2024 with hypertensive urgency resulting in flash pulmonary edema and acute on chronic respiratory failure and heart failure exacerbation.  Diuresed with Lasix  40 mg IV bid.  EF reduced from 45 to 35% he underwent left and right heart catheterization RHC showed elevated mean arterial pressure with mildly elevated filling pressures and preserved indices.  Coronary angiogram showed 90% PDA stenosis and in the absence of angina it was medically managed.  LifeVest on DC.  Weight on admission 243 pounds discharge 238 pounds.   Hospitalized 1/13-1/18/26 at [facility] after presenting with cough, shortness of breath, wheezing, fatigue, chest and abdominal pain.  He was treated for COPD exacerbation with albuterol  nebulizations, prednisone  40 mg daily for 5-day course, azithromycin  250 mg daily for 5-day course.  He received Bumex  2 mg IV for volume management.  He was continued on home CPAP therapy.  During his stay, NT proBNP 1123 and high-sensitivity troponin 22-21-22.  He was instructed to pause Entresto .  He was continued on Bumex  1 mg daily.    Today, he reports the day he left the hospital he noted worsening shortness of breath, abdominal fullness, weight gain which he suspects is due to holding diuretic and Entresto  during his hospitalization.  Since he discharged there was 1 day he took 5 tablets of Lasix  (he had not picked up Bumex  from previous visit) and his volume status improved. Today, he is still feeling somewhat volume overloaded-- weight is up about 6 lb with dry weight at home around 238 lb. He always has DOE but this is more profound today. Yesterday he needed to use his albuterol  inhaler. He reported hypotension at pulmonary rehab (systolic in 80s) with worsening fatigue, lightheadedness, dizziness but this has resolved now (systolic BP today in clinic 109). He thinks the isordil  is causing his blood pressure. Med rec completed and he is taking medications as directed now.  LifeVest is in place.  Denies shocks or red alarms.           Vitals:    05/14/24 1252   BP: 109/61   BP Source: Arm, Right Upper   Pulse: 78   SpO2: 98%   O2 Device: CPAP/BiPAP   PainSc: Eight   Weight: 112.6 kg (248 lb 3.2 oz)   Height: 177.8 cm (5' 10)     Body mass index is 35.61 kg/m?SABRA    Physical Exam:    General Appearance: no distress  Skin: warm and dry  Neck Veins: JVP ~8 cm, HJR +  Auscultation: breathing  comfortably, lungs clear to auscultation in upper lobes; rales in left lower lobe; no wheezing  Cardiac Auscultation: Regular rhythm, S1, S2, no S3 or S4, no audible murmur  Lower Extremity Edema: no   Abdominal Exam: distended, non-tender, bowel sounds normal  Orientation: clear historian, good insight    Cardiovascular Studies  LHC/RHC 03/28/2024    HEMODYNAMICS:    Body surface area 2.3 m2.  Hemoglobin 14.7 g/dL.  Blood pressure 138/79 with a mean of 103 mmHg.  Heart rate 65 beats per minute.     SATURATIONS:    Aorta 97%.  Right atrium 66%.  Pulmonary artery 69%.     PRESSURES:    Right atrium 8 mmHg.  Right ventricle 37/10 mmHg.  Pulmonary artery 33/52 with a mean of 22 mmHg.  Pulmonary capillary wedge pressure 15 mmHg.  Transpulmonary gradient 7 mmHg.  Diastolic pulmonary gradient 0 mmHg.  Av-O2 difference 5.7 g/dL.  By thermodilution method, cardiac output 5.5 L/minute, cardiac index 2.4 L/minute per meter squared.  By Denney method, cardiac output 4.96 L/minute, cardiac index 2.2 L/minute per meter squared.  Systemic vascular resistance 1352 dynes.second.cm to 5.  Pulmonary vascular resistance 1.3 Woods unit.     LV HEMODYNAMICS:    Central aortic pressure 109/62 with a mean of 81 mmHg.  LV systolic pressure 114 mmHg.  LVEDP 16 mmHg.     SELECTIVE CORONARY ANGIOGRAPHY:    Left main coronary artery:  It arises from the left coronary sinus, bifurcates into left anterior descending and left circumflex artery.  Left main is a large caliber vessel.  It is angiographically normal.  Left anterior descending artery:  It is a large caliber vessel, reaches the apex and wraps around, type 3 configuration.  The proximal-to-mid LAD has a prior stent which has a moderate diffuse in-stent restenosis with areas of 50% stenosis.  LAD gives off 3 diagonal branches.  The first is a small-to-medium caliber which has a mild diffuse disease.  Second is a medium caliber vessel, which divides into superior and inferior division.  It has a mild luminal irregularity.  The third is a small caliber artery, which has also mild luminal irregularity.  Distal LAD is angiographically normal.  Left circumflex artery:  It is a large caliber codominant vessel, which gives rise to 2 marginal branches and then bifurcates into left posterolateral ventricular branch and left posterior descending artery.  The proximal circumflex has a mild luminal irregularity.  The first obtuse OM1 artery has a 60% stenosis in the ostium.  The second OM artery has a mild luminal irregularity.  It has a mild diffuse disease throughout the course.  The mid circumflex has a mild diffuse disease with area of 30% stenosis.  Left PLV and PDA are small-to-medium caliber vessels and they have a mild diffuse disease.  Right coronary artery:  It is a large caliber codominant vessel, it arises from the right coronary sinus, bifurcates into a long, medium caliber right posterior descending artery and medium-to-large caliber two right posterolateral ventricular branches.  The proximal RCA is ectatic.  Entire RCA has a moderate diffuse disease.  In the mid RCA, there are areas of 50% stenosis.  RCA is ectatic.  Distal RCA just before bifurcation has a 50% to 60% stenosis.  Ostium, PLV has 60% stenosis.  The right PDA has a 90% stenosis.     - TTE: 03/24/2024 -     The left ventricular systolic function is moderately reduced. The visually estimated ejection fraction is  35%.    The LV is dilated, global hypokinesis.    No intracardiac thrombus.    Grade I (mild) left ventricular diastolic dysfunction. Cannot determine left atrial pressure.    The right ventricular size is normal. The right ventricular systolic function is normal.    This was a 2D echo study only, cardiac valve structures were not fully evaluated.     - Regadenoson  Stress Test: 08/30/2023   SUMMARY/OPINION:  This study is abnormal.  There is a moderate size, mild intensity, predominantly fixed of decreased radiotracer uptake in the basal to apical inferior wall segments consistent with an area of myocardial injury.  Global left ventricular systolic function is moderately reduced.  Calculated ejection fraction 29%.  Hypokinesis is slightly more pronounced in the inferior wall segments.  No evidence of transient ischemic dilatation.  The pharmacologic ECG portion of the study is nondiagnostic for ischemia.  Comparison is made with a prior D SPECT study completed 11/15/2021.  Ejection fraction was 35%.  Calculated ejection fraction is less on current study at 29%.  The perfusion pattern is quite similar.  In aggregate the current study is high risk in regards to predicted annual cardiovascular mortality rate.    Cardiovascular Health Factors  Vitals BP Readings from Last 3 Encounters:   05/14/24 109/61   05/11/24 (!) 146/77   04/30/24 102/72     Wt Readings from Last 3 Encounters:   05/14/24 112.6 kg (248 lb 3.2 oz)   05/11/24 113.9 kg (251 lb)   04/30/24 111.8 kg (246 lb 6.4 oz)     BMI Readings from Last 3 Encounters:   05/14/24 35.61 kg/m?   05/11/24 35.01 kg/m?   04/30/24 34.37 kg/m?      Smoking Tobacco Use History[1]   Lipid Profile Cholesterol   Date Value Ref Range Status   06/26/2023 82 <200 mg/dL Final     HDL   Date Value Ref Range Status   06/26/2023 30 (L) >40 mg/dL Final     LDL   Date Value Ref Range Status   06/26/2023 42 <100 mg/dL Final     Triglycerides   Date Value Ref Range Status   06/26/2023 74 <150 mg/dL Final      Blood Sugar Hemoglobin A1C   Date Value Ref Range Status   03/24/2024 6.8 (H) 4.0 - 5.7 % Final     Comment:     The ADA recommends that most patients with type 1 and type 2 diabetes maintain an A1c level <7%.     Glucose   Date Value Ref Range Status   05/11/2024 102 (H) 70 - 100 mg/dL Final   98/82/7973 96 70 - 100 mg/dL Final   98/83/7973 896 (H) 70 - 100 mg/dL Final     Glucose, POC   Date Value Ref Range Status   05/11/2024 107 (H) 70 - 100 mg/dL Final   98/81/7973 855 (H) 70 - 100 mg/dL Final   98/82/7973 705 (H) 70 - 100 mg/dL Final            Problems Addressed Today  Encounter Diagnoses   Name Primary?    Chronic combined systolic and diastolic congestive heart failure (CMS-HCC) Yes              Assessment/Plan:    Chronic systolic and diastolic heart failure, LVEF 35%, LVIDD 6.5 cm, LVDVI 91 mL/m2 (TTE 03/24/24)  TTE 10/04/2023: LVEF 34%  TTE 02/23/2022 LVEF 45%  TTE 2021 LVEF 50%  TTE  2004 LVEF 25%  Ischemic cardiomyopathy  - NYHA Functional Class III, AHA stage C (structural heart disease with prior or current symptoms of HF) symptoms.    - Estimated dry weight: 238 pounds.    - Patient appears hypervolemic today BNP Labs:   Lab Results   Component Value Date    NTPROBNP 1,123 (H) 05/09/2024    NTPROBNP 2,444 (H) 05/06/2024    NTPROBNP 2,127 (H) 04/02/2024      Diuretics  Current Dose Changes   Bumex  1 mg daily 1/21: He just started this 1/20.  Continue     GDMT Current Dose Changes   BB Coreg  37.5 mg twice daily    ACEI/ARB/ARNI Entresto  97-103 mg twice daily    Aldosterone Antagonist None, CKD    SGLT-2 Inhibitor Jardiance  5 mg daily    Hydralazine /Nitrate  Hydralazine  50 mg TID  Isordil  20 mg TID 1/21: decrease hydralazine  to 25 mg TID, Isordil  to 10 mg TID   Ivabradine None    -Given hypotension intermittently will decrease hydralazine  and Isordil .  He is slightly hypervolemic on exam but just started Bumex  yesterday.  I do think he will respond well to this.  I asked him to call our clinic if he does not note improved symptoms and reduced weight on Bumex  and we will see him for sooner follow-up to prevent hospitalization  - Device: LifeVest in place (December 2025).  Limited echo scheduled February 24, 26 to assess LVEF prior to visit with Dr. Maylene 07/01/24 to discuss ICD for primary prevention. He continues to wear LifeVest.  - Labs: NTproBNP, BMP in one week  - Cardiac Rehab: Last ordered 03/26/2024  - Follow-up: 06/17/2024 with Randall Sierra, NP as scheduled    CAD s/p PCI to LAD (2007, 2009)  - last ischemic evaluation: December 2025: 90% PDA stenosis-not treated as it was asymptomatic  - denying anginal type symptoms   - continue Plavix  75 mg daily, Crestor  40 mg daily    History of LV thrombus  - TTE 03/26/2024 showed resolution of LV thrombus. Per Dr. Vincenza plan for lifelong DOAC  -He is continued on Eliquis  5 mg twice daily    PAD  - S/p multiple left SFA stents (2014, 2016)  - Follows with Dr. Leva; continue Plavix  and rosuvastatin  as above    Hypertension  - continue medications as noted above  - controlled, blood pressure today in clinic 109/61    Dyslipidemia  Lab Results   Component Value Date    CHOL 82 06/26/2023    TRIG 74 06/26/2023    HDL 30 (L) 06/26/2023    LDL 42 06/26/2023    VLDL 85.1 06/26/2023    NONHDLCHOL 52 06/26/2023   - continue Crestor  40 mg daily    Iron  Deficiency  Lab Results   Component Value Date/Time    IRON  32 (L) 03/25/2024 06:02 AM    PSAT 10 (L) 03/25/2024 06:02 AM    TIBC 323 03/25/2024 06:02 AM    FERRITIN 87 03/25/2024 06:02 AM   - received IV Venofer  while inpatient 12/2 - 03/27/2024    CKD  Cystic kidney disease  - recent creatinine trends 1.6-2.0  Lab Results   Component Value Date    CR 1.87 (H) 05/11/2024    CR 2.23 (H) 05/10/2024    CR 2.11 (H) 05/09/2024   -has followed with [place] [facility] before but it has been almost 2 years- resending referral today, 05/14/24    Diabetes Mellitus Type II  -  HgA1C   Lab Results   Component Value Date/Time    HGBA1C 6.8 (H) 03/24/2024 05:07 AM    HGBA1C 7.4 (H) 06/26/2023 03:32 PM    HGBA1C 7.3 (H) 05/11/2017 02:55 AM    A1C 7.8 (A) 05/04/2022 12:00 AM    -Managed per PCP    COPD  Tobacco use  OSA  - encourage compliance with CPAP  - Currently participating in pulmonary rehab.  Follows with pulmonary at [facility]    Follow up  Appointment scheduled to return to clinic on 06/17/24 as scheduled. He is in agreement and states understanding of this plan. He was encouraged to reach out to our clinic with any questions, concerns, or change in condition. Please see AVS for full patient education.          Total Time Today was 48 minutes in the following activities: Preparing to see the patient, Obtaining and/or reviewing separately obtained history, Performing a medically appropriate examination and/or evaluation, Counseling and educating the patient/family/caregiver, Ordering medications, tests, or procedures as appropriate, Referring and communication with other health care professionals (when not separately reported), Documenting clinical information in the electronic or other health record, and Care coordination (not separately reported)    Thank you for the opportunity to participate in this patient's care. Please do not hesitate to contact me with any questions/concerns.    Verneita Flock, DNP, APRN, AGNP-C  Advanced Heart Failure APP   [facility]  Collaborating physician: Dr. Jamas Fairly      Current Medications (including today's revisions)   albuterol  0.083% (PROVENTIL ) 2.5 mg /3 mL (0.083 %) nebulizer solution Inhale 3 mL solution by nebulizer as directed every 4 hours as needed. Indications: asthma attack    albuterol  sulfate (PROAIR  HFA) 90 mcg/actuation HFA aerosol inhaler Inhale one puff to two puffs by mouth into the lungs every 4 hours as needed for Wheezing (or shortness of breath). Indications: asthma attack    azelastine  (ASTELIN ) 137 mcg (0.1 %) nasal spray Apply two sprays to each nostril as directed twice daily. Indications: non-seasonal allergic stuffy and runny nose    betamethasone dipropionate (DIPROSONE) 0.05 % topical ointment USE TO WORST ITCHY AREAS ON BODY TWICE DAILY AS NEEDED    budesonide -glycopyr-formoterol  (BREZTRI  AEROSPHERE) 160-9-4.8 mcg/actuation inhaler Inhale two puffs by mouth into the lungs twice daily.    bumetanide  (BUMEX ) 1 mg tablet Take one tablet by mouth daily.    carvediloL  (COREG ) 12.5 mg tablet Take three tablets by mouth twice daily with meals. Take with food.  Indications: heart failure with reduced ejection fraction due to dilated cardiomyopathy    cetirizine  (ZYRTEC ) 10 mg tablet Take one tablet by mouth daily.    clopiDOGreL  (PLAVIX ) 75 mg tablet Take one tablet by mouth daily. Indications: a sudden worsening of angina called acute coronary syndrome    DEXCOM G7 SENSOR sensor device Use one each as directed daily.    dulaglutide (TRULICITY) 0.75 mg/0.5 mL injection pen Inject 0.5 mL under the skin every 7 days.    DUPIXENT PEN 300 mg/2 mL injectable PEN Inject 2 mL under the skin every 7 days.    ELIQUIS  5 mg tablet TAKE 1 TABLET (5 MG) BY MOUTH TWICE DAILY    empagliflozin  (JARDIANCE ) 10 mg tablet Take half tablet by mouth daily.    fluticasone  propionate (FLONASE ) 50 mcg/actuation nasal spray, suspension Apply one spray to each nostril as directed daily.    gabapentin  (NEURONTIN ) 300 mg capsule Take one capsule by  mouth three times daily. (Patient taking differently: Take two capsules by mouth twice daily as needed. Indications: PAIN)    hydrALAZINE  (APRESOLINE ) 50 mg tablet Take one-half tablet by mouth three times daily. Indications: chronic heart failure    HYDROcodone /acetaminophen  (NORCO) 7.5/325 mg tablet Take one tablet by mouth twice daily as needed for Pain. Moderate to severe pain.    isosorbide  dinitrate (ISORDIL  TITRADOSE) 20 mg tablet Take one-half tablet by mouth three times daily. Indications: chronic heart failure    mupirocin  (CENTANY ) 2 % topical ointment Apply  topically to affected area three times daily. Apply to the front of the nose as needed up to three times per day    Nebulizer Accessories kit Please dispense one nebulizer kit    nicotine  (NICODERM CQ ) 14 mg/day patch Apply one patch to top of skin as directed daily. Rotate patch location.  Indications: stop smoking    rosuvastatin  (CRESTOR ) 40 mg tablet Take one tablet by mouth at bedtime daily. Indications: excessive fat in the blood    sacubitriL -valsartan  (ENTRESTO ) 97-103 mg tablet Take one tablet by mouth twice daily.    tamsulosin  (FLOMAX ) 0.4 mg capsule Take one capsule by mouth daily. (Patient taking differently: Take one capsule by mouth daily as needed.)    tiZANidine  (ZANAFLEX ) 4 mg tablet Take one tablet by mouth every 6-8 hours as needed. NTE 3 doses in 24hrs.    traZODone  (DESYREL ) 100 mg tablet Take two tablets by mouth at bedtime daily. (Patient taking differently: Take two tablets by mouth at bedtime as needed.)             [1]   Social History  Tobacco Use   Smoking Status Some Days    Current packs/day: 5.00    Average packs/day: 5.0 packs/day for 52.1 years (260.3 ttl pk-yrs)    Types: Cigarettes    Start date: 04/24/1972   Smokeless Tobacco Never   Tobacco Comments    Patient states he had somked up to 5 ppd at his peak     *09/30/2021 5 cigarettes daily

## 2024-05-13 NOTE — Telephone Encounter [36]
 RN contacted pt and scheduled pt to see Damien Mylar, APRN for [facility] HFU on 05/15/24 at 4 pm.

## 2024-05-13 NOTE — Telephone Encounter [36]
 Please contact pt to schedule for hospital follow-up this week.

## 2024-05-13 NOTE — Telephone Encounter [36]
 Patient Discharge Date from hospital: 05/11/2024  Date Call Attempted: 05/13/2024  Number of Attempts: 1   Date Call Completed: 05/13/2024         Two Patient Identifier complete: Yes [x]     Next Appointment    Next follow-up appointment is on 05/16/24 at 11:00 AM with Levon Clause, APRN at [facility] clinic location. Patient reported location is too far away and requested appointment at main hospital clinic location. Also stated will be at [place] tomorrow (1/21) at 8:00 AM and is requesting appointment then. Patient stated will not be at appointment on 1/23. This RN spoke with scheduling and returned call to patient. Offered office visit with Verneita Flock, APRN tomorrow at 1:00 PM. Patient accepted and verbalized understanding of clinic location.     New appointment rescheduled to 05/14/24 at 1:00 PM with Verneita Flock, APRN at [facility] main campus clinic location.     Transportation    Does pt have transportation?  Yes [x]     No []    NA []      Home Health    [facility]   Ph) [phone]   Fax) [phone]    Medications    Does pt have all medications? Yes  [x]     No []     Does patient have any questions regarding medications? Yes [x]   No []     Patient reported has picked up the ASTELIN , but never received medication that I was supposed ot take for 5 days for pneumonia. This RN could not identify medication on list and patient could not remember name of medication. This RN instructed patient to discuss with Verneita Flock, APRN at appointment. Patient also stated, I am not going to stop taking my Entresto . Patient will also discuss with provider.     START taking:  azelastine  (ASTELIN )  PAUSE taking:  ENTRESTO  97-103 mg tablet (sacubitril -valsartan )      Diet    200 mg cholesterol, 2 G Na, 2 L fluid restriction     Is patient following prescribed diet and restrictions?  Yes [x]    No []      If you have questions regarding your diet at home, you may contact a dietitian at [phone].    Scale/Weight    Does pt have a scale at home?  Yes [x]    No []     Did pt weight first thing this morning?  Yes [x]    No []      If yes, what was pt's first morning weight today?  236 lbs regular weight (252 lbs on discharge).     Signs and Symptoms    Pt reports the following symptoms:     Patient verbalized when discharged from hospital fluid accumulation was way worse than when I got there and I was very short of breath. Patient denied any visible swelling, but stated short of breath and weight on discharge was up 14 lbs. Reported weight of 252 lbs on discharge. Weight today is 236 lbs. This RN asked patient if was taking Bumex . Patient stated I had to get the fluid off, so on 1/18 I took 5 pills of Lasix  and then took another this morning. Patient confirmed not taking Bumex  and Lasix  together. Reports is out of Bumex  and will pickup medication today. This RN discussed with patient if Lasix  was an old medication as not on medication list. Patient stated as I said earlier, I had to get the fluid off so I took the Lasix . This RN instructed patient to stop taking  Lasix  and resume prescribed Bumex . Since taking Lasix , patients weight down and shortness of breath has improved, but is still present.      Was pt given zone sheet? Yes []   No [x]     Does patient believe they received adequate education during admission for their diagnosis of heart failure? Yes [x]   No []   NA []     Comments: Patient stated, I am very familiar with heart failure.    Is patient able to read back weights or symptoms of heart failure and when to notify the provider? Yes [x]   No []   NA []     Autonomously? Yes  [x]   With cueing?  Yes     []   Unable or declined?  []          Has patient verbalized understanding the importance to review and utilize the heart failure zone tool provided? Yes  []  No []   NA  [x]     Patient did not receive heart failure zone tool sheet on discharge, but is familiar with it. Not active on mychart. Will provide at follow up.      Intervention(s)    Pt educated on the importance of weighing daily first thing in the morning before dressing, before eating or drinking, and after voiding using the same scale in the same location and write results down in note pad or log. Notify us  for weight gains of 3 lbs in one day or 5 lbs in one week. Notify your provider for increased SOA.  Notify your provider for swelling or increased swelling in BLE or abdominal fullness/bloating. Advised to check B/P at least once daily. Check 1-2 hours after first morning medications. Be sure you are sitting down for at least five minutes before putting the cuff on. One you turn the cuff on keep your feet flat on the floor and remain still. No moving, drinking, eating, or talking.  Log results. Check B/P other times if feeling lightheaded, dizzy, or if you feel your heart rate is elevated. Document the time you checked and any symptoms you may be feeling at the time. Call 911 for sudden, severe chest/pain pressure/SOA develops. Be sure to keep your follow up appointment and bring your weight logs, B/P logs, and medication list with you to your appointment. Call us  at [phone] if you have any questions.      Plan of Care    Continued education needed for heart failure symptom management and when to contact our office.

## 2024-05-14 ENCOUNTER — Encounter: Admit: 2024-05-14 | Discharge: 2024-05-14 | Payer: MEDICARE

## 2024-05-14 ENCOUNTER — Ambulatory Visit: Admit: 2024-05-14 | Discharge: 2024-05-15 | Payer: MEDICARE

## 2024-05-14 VITALS — BP 109/61 | HR 78 | Ht 70.0 in | Wt 248.2 lb

## 2024-05-14 MED ORDER — HYDRALAZINE 50 MG PO TAB
25 mg | Freq: Three times a day (TID) | ORAL | 0 refills | 42.50000 days | Status: AC
Start: 2024-05-14 — End: ?

## 2024-05-14 MED ORDER — ISOSORBIDE DINITRATE 20 MG PO TAB
10 mg | Freq: Three times a day (TID) | ORAL | 0 refills | 45.00000 days | Status: AC
Start: 2024-05-14 — End: ?

## 2024-05-15 ENCOUNTER — Encounter: Admit: 2024-05-15 | Discharge: 2024-05-15 | Payer: MEDICARE

## 2024-05-15 ENCOUNTER — Ambulatory Visit: Admit: 2024-05-15 | Discharge: 2024-05-16 | Payer: MEDICARE

## 2024-05-15 DIAGNOSIS — I5042 Chronic combined systolic (congestive) and diastolic (congestive) heart failure: Principal | ICD-10-CM

## 2024-05-16 ENCOUNTER — Encounter: Admit: 2024-05-16 | Discharge: 2024-05-16 | Payer: MEDICARE

## 2024-05-20 ENCOUNTER — Encounter: Admit: 2024-05-20 | Discharge: 2024-05-20 | Payer: MEDICARE

## 2024-05-20 ENCOUNTER — Encounter: Admit: 2024-05-20 | Discharge: 2024-05-21 | Payer: MEDICARE

## 2024-05-21 ENCOUNTER — Encounter: Admit: 2024-05-21 | Discharge: 2024-05-21 | Payer: MEDICARE

## 2024-05-21 NOTE — Telephone Encounter [36]
 Attempted to contact pt to discuss getting labs completed. Will send [facility].

## 2024-05-21 NOTE — Telephone Encounter [36]
-----   Message from [place] W sent at 05/14/2024  1:39 PM CST -----  Regarding: Follow up on labs  Patient had OV with DR on 1/21:    ? Medications:   o Decrease hydralazine  to 25 mg (half tablet) three times per day   o Decrease Isordil  to 10 mg (half tablet) three times per day.   o Continue the Bumex  1 mg (one tablet) daily as discussed    ? Labs: BMP, NTproBNP in about one week.    Follow up on if patient completed labs

## 2024-05-24 ENCOUNTER — Encounter: Admit: 2024-05-24 | Discharge: 2024-05-24 | Payer: MEDICARE

## 2024-05-27 ENCOUNTER — Encounter: Admit: 2024-05-27 | Discharge: 2024-05-27 | Payer: MEDICARE

## 2024-05-29 ENCOUNTER — Encounter: Admit: 2024-05-29 | Discharge: 2024-05-29 | Payer: MEDICARE
